# Patient Record
Sex: Male | Born: 1965
Health system: Southern US, Community
[De-identification: ages and names within clinical notes are randomized; demographics above are authoritative.]

## PROBLEM LIST (undated history)

## (undated) DIAGNOSIS — R51 Headache: Secondary | ICD-10-CM

## (undated) DIAGNOSIS — R519 Headache, unspecified: Secondary | ICD-10-CM

## (undated) DIAGNOSIS — M549 Dorsalgia, unspecified: Secondary | ICD-10-CM

## (undated) DIAGNOSIS — E119 Type 2 diabetes mellitus without complications: Secondary | ICD-10-CM

## (undated) DIAGNOSIS — I10 Essential (primary) hypertension: Principal | ICD-10-CM

## (undated) DIAGNOSIS — R413 Other amnesia: Secondary | ICD-10-CM

## (undated) DIAGNOSIS — M5030 Other cervical disc degeneration, unspecified cervical region: Secondary | ICD-10-CM

## (undated) DIAGNOSIS — E7800 Pure hypercholesterolemia, unspecified: Secondary | ICD-10-CM

## (undated) DIAGNOSIS — M5441 Lumbago with sciatica, right side: Secondary | ICD-10-CM

## (undated) DIAGNOSIS — Z8619 Personal history of other infectious and parasitic diseases: Secondary | ICD-10-CM

## (undated) DIAGNOSIS — B029 Zoster without complications: Secondary | ICD-10-CM

## (undated) DIAGNOSIS — G43909 Migraine, unspecified, not intractable, without status migrainosus: Secondary | ICD-10-CM

## (undated) DIAGNOSIS — G909 Disorder of the autonomic nervous system, unspecified: Secondary | ICD-10-CM

## (undated) DIAGNOSIS — M4800 Spinal stenosis, site unspecified: Secondary | ICD-10-CM

## (undated) HISTORY — DX: Headache, unspecified: R51.9

## (undated) HISTORY — PX: BACK SURGERY: SHX140

## (undated) HISTORY — DX: Pure hypercholesterolemia, unspecified: E78.00

## (undated) HISTORY — DX: Personal history of other infectious and parasitic diseases: Z86.19

## (undated) HISTORY — DX: Spinal stenosis, site unspecified: M48.00

## (undated) HISTORY — DX: Other cervical disc degeneration, unspecified cervical region: M50.30

## (undated) HISTORY — DX: Essential (primary) hypertension: I10

## (undated) HISTORY — DX: Migraine, unspecified, not intractable, without status migrainosus: G43.909

## (undated) HISTORY — DX: Lumbago with sciatica, right side: M54.41

## (undated) HISTORY — DX: Headache: R51

## (undated) HISTORY — DX: Other amnesia: R41.3

## (undated) HISTORY — DX: Zoster without complications: B02.9

## (undated) HISTORY — DX: Disorder of the autonomic nervous system, unspecified: G90.9

## (undated) HISTORY — DX: Type 2 diabetes mellitus without complications: E11.9

---

## 1998-10-24 ENCOUNTER — Encounter: Payer: Self-pay | Admitting: Emergency Medicine

## 1998-10-24 ENCOUNTER — Emergency Department (HOSPITAL_COMMUNITY): Admission: EM | Admit: 1998-10-24 | Discharge: 1998-10-24 | Payer: Self-pay | Admitting: Emergency Medicine

## 1999-08-30 ENCOUNTER — Ambulatory Visit (HOSPITAL_COMMUNITY): Admission: RE | Admit: 1999-08-30 | Discharge: 1999-08-30 | Payer: Self-pay | Admitting: Specialist

## 1999-08-30 ENCOUNTER — Encounter: Payer: Self-pay | Admitting: Specialist

## 1999-11-08 ENCOUNTER — Encounter (INDEPENDENT_AMBULATORY_CARE_PROVIDER_SITE_OTHER): Payer: Self-pay | Admitting: Specialist

## 1999-11-08 ENCOUNTER — Observation Stay (HOSPITAL_COMMUNITY): Admission: RE | Admit: 1999-11-08 | Discharge: 1999-11-09 | Payer: Self-pay | Admitting: Specialist

## 1999-11-08 ENCOUNTER — Encounter: Payer: Self-pay | Admitting: Specialist

## 2002-08-19 ENCOUNTER — Encounter: Admission: RE | Admit: 2002-08-19 | Discharge: 2002-08-19 | Payer: Self-pay | Admitting: Family Medicine

## 2002-08-19 ENCOUNTER — Encounter: Payer: Self-pay | Admitting: Family Medicine

## 2005-05-01 HISTORY — PX: WISDOM TOOTH EXTRACTION: SHX21

## 2006-02-03 ENCOUNTER — Encounter: Admission: RE | Admit: 2006-02-03 | Discharge: 2006-02-03 | Payer: Self-pay | Admitting: Rheumatology

## 2008-11-27 ENCOUNTER — Encounter: Admission: RE | Admit: 2008-11-27 | Discharge: 2008-11-27 | Payer: Self-pay | Admitting: Emergency Medicine

## 2008-11-29 ENCOUNTER — Encounter: Admission: RE | Admit: 2008-11-29 | Discharge: 2008-11-29 | Payer: Self-pay | Admitting: Emergency Medicine

## 2009-01-14 ENCOUNTER — Encounter: Admission: RE | Admit: 2009-01-14 | Discharge: 2009-01-14 | Payer: Self-pay | Admitting: Specialist

## 2009-02-04 ENCOUNTER — Ambulatory Visit (HOSPITAL_COMMUNITY): Admission: RE | Admit: 2009-02-04 | Discharge: 2009-02-05 | Payer: Self-pay | Admitting: Specialist

## 2009-05-20 ENCOUNTER — Encounter: Admission: RE | Admit: 2009-05-20 | Discharge: 2009-05-20 | Payer: Self-pay | Admitting: Specialist

## 2009-10-07 ENCOUNTER — Ambulatory Visit: Payer: Self-pay | Admitting: Vascular Surgery

## 2009-11-15 ENCOUNTER — Ambulatory Visit: Payer: Self-pay | Admitting: Vascular Surgery

## 2009-12-02 ENCOUNTER — Ambulatory Visit: Payer: Self-pay | Admitting: Vascular Surgery

## 2010-01-11 ENCOUNTER — Ambulatory Visit: Payer: Self-pay | Admitting: Vascular Surgery

## 2010-02-01 ENCOUNTER — Ambulatory Visit: Payer: Self-pay | Admitting: Vascular Surgery

## 2010-02-10 ENCOUNTER — Ambulatory Visit: Payer: Self-pay | Admitting: Vascular Surgery

## 2010-05-21 ENCOUNTER — Encounter: Payer: Self-pay | Admitting: Rheumatology

## 2010-08-04 LAB — COMPREHENSIVE METABOLIC PANEL
ALT: 40 U/L (ref 0–53)
AST: 23 U/L (ref 0–37)
Albumin: 4 g/dL (ref 3.5–5.2)
Alkaline Phosphatase: 77 U/L (ref 39–117)
BUN: 21 mg/dL (ref 6–23)
CO2: 30 mEq/L (ref 19–32)
Calcium: 9.6 mg/dL (ref 8.4–10.5)
Chloride: 102 mEq/L (ref 96–112)
Creatinine, Ser: 1.48 mg/dL (ref 0.4–1.5)
GFR calc Af Amer: >60 mL/min (ref >60)
GFR calc non Af Amer: 52 mL/min — ABNORMAL LOW (ref >60)
Glucose, Bld: 123 mg/dL — ABNORMAL HIGH (ref 70–99)
Potassium: 4.8 mEq/L (ref 3.5–5.1)
Sodium: 138 mEq/L (ref 135–145)
Total Bilirubin: 0.9 mg/dL (ref 0.3–1.2)
Total Protein: 7.8 g/dL (ref 6.0–8.3)

## 2010-08-04 LAB — CBC
HCT: 41.7 % (ref 39.0–52.0)
Hemoglobin: 14.3 g/dL (ref 13.0–17.0)
MCHC: 34.3 g/dL (ref 30.0–36.0)
MCV: 97.1 fL (ref 78.0–100.0)
Platelets: 197 10*3/uL (ref 150–400)
RBC: 4.29 MIL/uL (ref 4.22–5.81)
RDW: 12.9 % (ref 11.5–15.5)
WBC: 5.3 10*3/uL (ref 4.0–10.5)

## 2010-08-04 LAB — PROTIME-INR
INR: 0.98 (ref 0.00–1.49)
Prothrombin Time: 12.9 seconds (ref 11.6–15.2)

## 2010-08-04 LAB — URINALYSIS, ROUTINE W REFLEX MICROSCOPIC
Bilirubin Urine: NEGATIVE
Glucose, UA: NEGATIVE mg/dL
Hgb urine dipstick: NEGATIVE
Ketones, ur: NEGATIVE mg/dL
Nitrite: NEGATIVE
Protein, ur: NEGATIVE mg/dL
Specific Gravity, Urine: 1.022 (ref 1.005–1.030)
Urobilinogen, UA: 0.2 mg/dL (ref 0.0–1.0)
pH: 5.5 (ref 5.0–8.0)

## 2010-08-04 LAB — APTT: aPTT: 28 seconds (ref 24–37)

## 2010-09-13 NOTE — Consult Note (Signed)
VASCULAR SURGERY CONSULTATION   Jimmy [name], Jimmy [name]  DOB:  [birth year]                                       10/07/2009  [id number]#:[id number]   The patient in the office today in consultation for possible anterior  exposure for anterior lumbar antibody fusion by Dr. Shelle Iron.  This a  pleasant 45 year old gentleman who states that he has had a 10 to 60-  year history of low back pain.  His symptoms have gradually progressed  over the years and especially over the last year have been particularly  bothersome.  The pain in his back is aggravated by any activity such as  sitting and standing or walking.  Pain radiates down into his hips at  times.  There are no really alleviating factors.  He has had no  associated symptoms.  He has undergone an extensive workup by Dr. Shelle Iron,  and he has degenerative changes at L4-L5.  He is status post previous  posterior lumbar decompression but continues to have back pain with  possible central recurrent disk protrusion.  We were asked to consult  concerning his appropriateness for anterior exposure, the main concern  being his size.   PAST MEDICAL HISTORY:  Significant for obesity.  He denies any history  of diabetes, hypertension, hypercholesterolemia, history of previous  myocardial infarction, history of congestive heart failure or history of  COPD.   FAMILY HISTORY:  There is no history of premature cardiovascular  disease.   SOCIAL HISTORY:  He is married.  He has 4 children.  He quit tobacco in  2008.  He does not drink alcohol on a regular basis.   REVIEW OF SYSTEMS:  GENERAL:  He has gained some weight largely because  he has been unable to exercise because of his back problems.  He is 280  pounds, 5 feet 9 inches tall.  CARDIOVASCULAR:  He has had no chest pain, chest pressure, palpitations  or arrhythmias.  He has had no claudication, rest pain or nonhealing  ulcers.  He has had no history of stroke, TIAs or amaurosis fugax.   He  has had no history of DVT or phlebitis.  Pulmonary, GI, GU, neurologic, musculoskeletal, psychiatric, ENT,  hematologic review of systems is unremarkable and is documented on the  medical history form in his chart.   PHYSICAL EXAMINATION:  This is a pleasant 45 year old gentleman who  appears his stated age.  He has significant obesity.  Blood pressure is  117/77, heart rate is 88 respiratory rate 18.  HEENT:  Unremarkable.  Lungs:  Clear bilaterally to auscultation.  Cardiovascular:  I do not  detect any carotid bruits.  He has a regular rate and rhythm.  He has  palpable femoral and pedal pulses with mild peripheral edema  bilaterally.  Abdomen: Obese, somewhat difficult to assess, but I do not  appreciate any masses.  He has normal pitched bowel sounds.  Musculoskeletal:  No major deformities or cyanosis.  Neurologic:  He has  no focal weakness or paresthesias.  Skin:  There are no ulcers or  rashes.   I have reviewed his x-rays of his back which demonstrate degenerative  changes at L4-L5, as mentioned.   I had a long discussion with the patient and his wife today about the  risks of anterior exposure for anterior lumbar interbody fusion.  I have  explained that the main risk of the procedure is related to having to  mobilize and bluntly retract on the iliac veins and aorta to allow  adequate exposure of L4-L5.  We have discussed that 90% of the bleeding  injuries are related to exposures at the L4-L5 level and also that this  risk is significantly increased in patients with obesity.  I have  explained to him that at this point I have fairly limited experience  with the anterior exposures for ALIF and do not feel comfortable in  exposing the L4-L5 level in patients with significant obesity until I  have had more experience with this procedure.  Dr. Arbie Cookey does have more  experience with this, and I have spoken to him about this, and he would  be willing to consider exposure for  the patient if there were no other  options for addressing his back problems.  I have explained this to the  patient, and after discussion with Dr. Shelle Iron if he elects to proceed  with ALIF, he would like to meet with Dr. Arbie Cookey prior to scheduling  surgery.     Di Kindle. Edilia Bo, M.D.  Electronically Signed  CSD/MEDQ  D:  10/07/2009  T:  10/08/2009  Job:  [id number]   cc:   Jene Every, M.D.  Stan Head Cleta Alberts, M.D.

## 2010-09-13 NOTE — Assessment & Plan Note (Signed)
OFFICE VISIT   Jimmy [name], Jimmy [name]  DOB:  06/15/1965                                       02/01/2010  [id number]#:[id number]   Patient presents today with continued evaluation of potential anterior  exposure for ALIF.  He was seen in consultation with Dr. Cari Caraway  on 10/07/2009 with extensive evaluation at that time.  He does have  recurrent L4-5 disk disease and has been recommended by Dr. Paula Libra  to undergo anterior exposure for fusion.   I discussed this at length with patient and his wife present.  I again  explained our role in exposure.  He does have significant obesity.  I  explained that this does make the procedure somewhat more difficult but  certainly is very common in this disease population.  I explained that  since he has never had any intra-abdominal surgery that we would not  anticipate any adhesions or anything else that would complicate the  procedure.  I did discuss mobilization of the intraperitoneal contents,  ureter, and iliac arteries and veins, and potential injury of these.  I  also discussed the rare cases of erectile dysfunction and also a  possibility of retrograde ejaculation.  He understands.   He is changing insurance carriers and wishes to proceed with the  procedure as soon as he can assure insurance coverage, and I told him I  would be available to assist Dr. Shelle Iron as needed with the anterior  exposure once scheduled.     Larina Earthly, M.D.  Electronically Signed   TFE/MEDQ  D:  02/01/2010  T:  02/02/2010  Job:  [id number]   cc:   Jene Every, M.D.

## 2010-09-16 NOTE — Op Note (Signed)
[facility]  Patient:    Jimmy [name], Jimmy [name]                    MRN: [id number] Proc. Date: 11/08/99 Adm. Date:  [id number] Disc. Date: [id number] Attending:  Pierce Crane                           Operative Report  PREOPERATIVE DIAGNOSIS:  Herniated nucleus pulposus L4-5 central and bilateral lateral recessed stenosis.  POSTOPERATIVE DIAGNOSIS:  Herniated nucleus pulposus L4-5 central and bilateral lateral recessed stenosis.  OPERATION:  Bilateral hemilaminotomy with microdiskectomy, foraminotomies L4, L5.  ANESTHESIA:  General.  SURGEON:  Javier Docker, M.D.  ASSISTANT:  Della Goo, P.A.  BRIEF HISTORY AND INDICATIONS:  A 45 year old with bilateral L5 radiculopathy second to HNP centrally at L4-5, confirmed with MRI.  Positive neural tension signs bilaterally, right great than left.  Operative intervention is indicated for decompression due to failure to progress with conservative treatment. Risks and benefits were discussed, including bleeding infection, damage to vascular structures, CSF leak, ________  fibrosis, need for fusion in the future.  TECHNIQUE:  Patient in the supine position after the induction of adequate general anesthesia, 1 g Kefzol IV for antimicrobial prophylaxis.  Patient is placed prone in the [place] frame. All bony prominences are well-padded. Lumbar region is prepped and draped in the usual sterile fashion.  Two 18-gauge spinal needles utilized to localize the L4-5 interspace.  This is confirmed with x-ray.  Incision is made in the midline.  Marcaine 0.25% with epinephrine is infiltrated in the subcutaneous tissue.  Dorsolumbar fascia identified and divided on outline of the paraspinous ligament, preserving the interspinous ligament.  The paraspinous muscle is elevated from the lamina of L4 and L5 bilaterally.  Cole retractor is placed.  Operative microscope brought onto the surgical field.  High speed  burr was used to perform the hemilaminotomy at L4 and L5 bilaterally.  The significant lateral recessed stenosis noted bilaterally with hypertrophy of ligamenta flavum. This was removed with a 2 mm and a 3 mm Kerrison, protecting neural elements at all time.  Foraminotomy of L5 was performed on the left first, isolating the L5 nerve root.  Significant tension was note don the nerve root.  Nerve was gently immobilized medially.  Large disc herniation was noted more centrally.  Lateral recesses decompressed with a 2 mm Kerrison.  Foramina of L4 was stenotic as well and foraminotomy was performed here.  Nerve was protected at all times.  Annulotomy was performed with a 15 blade removing the disc from the disc space centrally with an upbiting and straight pituitary. Further disc material was mobilized with a nerve hook.  No residual disc material noted on the left.  _____-tip probe placed in the foramen of L5 and found to be widely patent.  Attention was turned to the right side in a similar fashion.  The intralaminar space was identified and lateral recesses decompressed and foraminotomies performed at L4 and L5 with serious stenosis here and ligamenta flavum hypertrophy, especially in the L5 root.  The flavum was identified, after the lateral recesses decompressed and the foraminotomies, was gently mobilized medially.  Disc herniation was noted here more central.  Annulotomy was performed.  Copious portion of disc material was removed from the interspace, further immobilized with a nerve hook and the disc herniation retrieved.  Some residual disc herniation noted.  The axilla was examined as it  was bilaterally, as well as the foramen of L4 and L5 and found to be widely patent without evidence disc herniation.  The thecal sac was examined caudad and cephalad without evidence of residual disc herniation. The disc was irrigated bilaterally.  The left disc space was reinspected, a small disc  fragment was noted and retrieved.  Disc was copiously irrigated. Bipolar electrocautery utilized to achieve hemostasis.  Inspection revealed no CSF leakage or active bleeding.  5% Gelfoam was placed in the laminotomy defects.  Richardson Dopp retrator was removed.  Spinous mucles inspected and no evidenc eof active bleeding. Dorsolumbar fascia reapproximated with #1 Vicryl figure of eight sutures.  Subcutaneous tissues reapproximated with 2-0 Vicryl simple sutures and skin reapproximated with 3-0 subcuticular PDS.  Wounds were reinforced with Steri-Strips.  Sterile dressing applied.  Patient placed supine on a hospital bed and extubated without difficulty and transported to the recovery room.  Patient tolerated the procedure well. DD:  11/08/99 TD:  11/08/99 Job: [id number]

## 2010-09-16 NOTE — H&P (Signed)
[facility]  Patient:    LYRIK, Jimmy [name]                      MRN: [id number] Adm. Date:  11/08/99 Attending:  Javier Docker, M.D. Dictator:   Grayland Jack, P.A.                         History and Physical  DATE OF BIRTH:  30-May-1965  CHIEF COMPLAINT:  Low back pain with bilateral lower extremity pain.  HISTORY OF PRESENT ILLNESS:  This 45 year old male has approximately a four-month history of low back and bilateral lower extremity radicular pain.  He has undergone conservative treatment, including epidural steroid injections and oral anti-inflammatories.  An MRI revealed an essential disk herniation at L4-L5 with associated bilateral lateral recess stenosis and neural foraminal compromise. ue to this patients failure to conservative treatment, it was felt that he would best benefit from surgical intervention at this time.  The risks, benefits, and complications of surgery were discussed with the patient in detail, and he agreed to proceed.  ALLERGIES:  No known drug allergies.  CURRENT MEDICATIONS:  Mobic 7.5 mg q.d.  PAST MEDICAL HISTORY:  Negative.  PAST SURGICAL HISTORY:  Negative.  SOCIAL HISTORY:  This patient lives at home with his grandmother and his son. There are approximately three steps into the usual entrance of his home.  He resides in a single-story house.  He smokes 1/2 to 1 pack of cigarettes per day. He denies alcohol use.  FAMILY HISTORY:  Significant for hypertension and diabetes mellitus.  REVIEW OF SYSTEMS:  GENERAL:  The patient denies fever, chills, weight changes, or malaise.  HEENT:  The patient denies headaches, visual changes, ear pain or pressure, nasal congestion, discharge, sore throats, or dysphagia. RESPIRATORY: The patient denies a productive cough, hemoptysis, or shortness of breath. CARDIAC:  The patient denies chest pain, palpitations, angina, or dyspnea on exertion.  GI:  The patient  denies abdominal pain, nausea, vomiting, constipation, diarrhea, or bloody stools.  GENITOURINARY:  The patient denies dysuria, hematuria, increasing frequency or hesitancy.  MUSCULOSKELETAL:  See the HPI. HEMATOLOGICAL: The patient denies a history of bleeding tendencies or anemia.  PHYSICAL EXAMINATION:  VITAL SIGNS:  Temperature afebrile, pulse 70, respirations 16, blood pressure 125/70.  GENERAL:  This is a well-developed, well-nourished 45 year old male, in no acute distress.  HEENT:  Normocephalic, atraumatic.  Pupils equal, round, reactive to light. Extraocular muscles intact.  NECK:  Supple, no jugular venous distention.  No lymphadenopathy and no carotid  bruits appreciated.  CHEST:  Clear to auscultation.  No rales, rhonchi, or wheezing bilaterally.  HEART:  A regular rate and rhythm.  No rubs, no gallops, no murmurs.  ABDOMEN:  Positive bowel sounds, soft, nontender.  No organomegaly appreciated.  BREASTS/RECTAL/GU:  Not done, not pertinent to the present illness.  EXTREMITIES:  Sensation intact to bilateral lower extremities.  He has mild right-sided lower extremity weakness.  Reflexes intact.  IMPRESSION:  Herniated nucleus pulposus at L4-L5.  PLAN:  This patient will be admitted to [facility] on July 0, 2001, to undergo bilateral hemilaminectomies and microdiskectomies at L4-L5. The postoperative course has been discussed with the patient, and all questions have been encouraged and answered. DD:  11/03/99 TD:  11/03/99 Job: [id number]

## 2014-10-21 ENCOUNTER — Encounter (HOSPITAL_COMMUNITY): Payer: Self-pay | Admitting: *Deleted

## 2014-10-21 ENCOUNTER — Emergency Department (HOSPITAL_COMMUNITY)
Admission: EM | Admit: 2014-10-21 | Discharge: 2014-10-21 | Disposition: A | Discharge status: Home / Self Care | Payer: Self-pay | Attending: Emergency Medicine | Admitting: Emergency Medicine

## 2014-10-21 DIAGNOSIS — Z720 Tobacco use: Secondary | ICD-10-CM | POA: Insufficient documentation

## 2014-10-21 DIAGNOSIS — H539 Unspecified visual disturbance: Secondary | ICD-10-CM | POA: Insufficient documentation

## 2014-10-21 DIAGNOSIS — R1032 Left lower quadrant pain: Secondary | ICD-10-CM | POA: Insufficient documentation

## 2014-10-21 DIAGNOSIS — H5712 Ocular pain, left eye: Secondary | ICD-10-CM | POA: Insufficient documentation

## 2014-10-21 DIAGNOSIS — J3489 Other specified disorders of nose and nasal sinuses: Secondary | ICD-10-CM | POA: Insufficient documentation

## 2014-10-21 DIAGNOSIS — Z8739 Personal history of other diseases of the musculoskeletal system and connective tissue: Secondary | ICD-10-CM | POA: Insufficient documentation

## 2014-10-21 DIAGNOSIS — R0981 Nasal congestion: Secondary | ICD-10-CM | POA: Insufficient documentation

## 2014-10-21 DIAGNOSIS — B029 Zoster without complications: Secondary | ICD-10-CM | POA: Insufficient documentation

## 2014-10-21 DIAGNOSIS — R51 Headache: Secondary | ICD-10-CM | POA: Insufficient documentation

## 2014-10-21 DIAGNOSIS — G8929 Other chronic pain: Secondary | ICD-10-CM | POA: Insufficient documentation

## 2014-10-21 DIAGNOSIS — R112 Nausea with vomiting, unspecified: Secondary | ICD-10-CM | POA: Insufficient documentation

## 2014-10-21 DIAGNOSIS — R42 Dizziness and giddiness: Secondary | ICD-10-CM | POA: Insufficient documentation

## 2014-10-21 DIAGNOSIS — R197 Diarrhea, unspecified: Secondary | ICD-10-CM | POA: Insufficient documentation

## 2014-10-21 HISTORY — DX: Dorsalgia, unspecified: M54.9

## 2014-10-21 LAB — CBC WITH DIFFERENTIAL/PLATELET
Basophils Absolute: 0 10*3/uL (ref 0.0–0.1)
Basophils Relative: 1 % (ref 0–1)
Eosinophils Absolute: 0.1 10*3/uL (ref 0.0–0.7)
Eosinophils Relative: 2 % (ref 0–5)
HCT: 40.9 % (ref 39.0–52.0)
Hemoglobin: 14.8 g/dL (ref 13.0–17.0)
Lymphocytes Relative: 25 % (ref 12–46)
Lymphs Abs: 1.1 10*3/uL (ref 0.7–4.0)
MCH: 32.7 pg (ref 26.0–34.0)
MCHC: 36.2 g/dL — ABNORMAL HIGH (ref 30.0–36.0)
MCV: 90.5 fL (ref 78.0–100.0)
Monocytes Absolute: 0.4 10*3/uL (ref 0.1–1.0)
Monocytes Relative: 10 % (ref 3–12)
Neutro Abs: 2.7 10*3/uL (ref 1.7–7.7)
Neutrophils Relative %: 62 % (ref 43–77)
Platelets: 193 10*3/uL (ref 150–400)
RBC: 4.52 MIL/uL (ref 4.22–5.81)
RDW: 12 % (ref 11.5–15.5)
WBC: 4.3 10*3/uL (ref 4.0–10.5)

## 2014-10-21 LAB — LIPASE, BLOOD: Lipase: 22 U/L (ref 22–51)

## 2014-10-21 LAB — COMPREHENSIVE METABOLIC PANEL
ALT: 21 U/L (ref 17–63)
AST: 19 U/L (ref 15–41)
Albumin: 3.6 g/dL (ref 3.5–5.0)
Alkaline Phosphatase: 77 U/L (ref 38–126)
Anion gap: 10 (ref 5–15)
BUN: 9 mg/dL (ref 6–20)
CO2: 23 mmol/L (ref 22–32)
Calcium: 9.3 mg/dL (ref 8.9–10.3)
Chloride: 101 mmol/L (ref 101–111)
Creatinine, Ser: 1.01 mg/dL (ref 0.61–1.24)
GFR calc Af Amer: >60 mL/min (ref >60)
GFR calc non Af Amer: >60 mL/min (ref >60)
Glucose, Bld: 235 mg/dL — ABNORMAL HIGH (ref 65–99)
Potassium: 3.3 mmol/L — ABNORMAL LOW (ref 3.5–5.1)
Sodium: 134 mmol/L — ABNORMAL LOW (ref 135–145)
Total Bilirubin: 0.7 mg/dL (ref 0.3–1.2)
Total Protein: 6.8 g/dL (ref 6.5–8.1)

## 2014-10-21 MED ORDER — HYDROCODONE-ACETAMINOPHEN 5-325 MG PO TABS: 1.0000 | ORAL_TABLET | ORAL | Status: DC | PRN

## 2014-10-21 MED ORDER — ACETAMINOPHEN 325 MG PO TABS
650.0000 mg | ORAL_TABLET | Freq: Once | ORAL | Status: AC
Start: 2014-10-21 — End: 2014-10-21
  Administered 2014-10-21: 650 mg via ORAL
  Filled 2014-10-21: qty 2

## 2014-10-21 MED ORDER — ACYCLOVIR 400 MG PO TABS: 800.0000 mg | ORAL_TABLET | Freq: Every day | ORAL | Status: DC

## 2014-10-21 MED ORDER — ONDANSETRON HCL 4 MG/2ML IJ SOLN
4.0000 mg | Freq: Once | INTRAMUSCULAR | Status: AC
  Administered 2014-10-21: 4 mg via INTRAVENOUS
  Filled 2014-10-21: qty 2

## 2014-10-21 MED ORDER — ACYCLOVIR 200 MG PO CAPS
800.0000 mg | ORAL_CAPSULE | Freq: Once | ORAL | Status: AC
  Administered 2014-10-21: 800 mg via ORAL
  Filled 2014-10-21: qty 4

## 2014-10-21 MED ORDER — FLUORESCEIN SODIUM 1 MG OP STRP
1.0000 | ORAL_STRIP | Freq: Once | OPHTHALMIC | Status: AC
  Administered 2014-10-21: 1 via OPHTHALMIC
  Filled 2014-10-21: qty 1

## 2014-10-21 MED ORDER — TETRACAINE HCL 0.5 % OP SOLN
1.0000 [drp] | Freq: Once | OPHTHALMIC | Status: AC
  Administered 2014-10-21: 1 [drp] via OPHTHALMIC
  Filled 2014-10-21: qty 2

## 2014-10-21 MED ORDER — SODIUM CHLORIDE 0.9 % IV BOLUS (SEPSIS)
1000.0000 mL | Freq: Once | INTRAVENOUS | Status: AC
  Administered 2014-10-21: 1000 mL via INTRAVENOUS

## 2014-10-21 MED ORDER — ONDANSETRON 4 MG PO TBDP
8.0000 mg | ORAL_TABLET | Freq: Once | ORAL | Status: AC
  Administered 2014-10-21: 8 mg via ORAL

## 2014-10-21 MED ORDER — ONDANSETRON 4 MG PO TBDP
ORAL_TABLET | ORAL | Status: AC
  Filled 2014-10-21: qty 2

## 2014-10-21 MED ORDER — MORPHINE SULFATE 4 MG/ML IJ SOLN
4.0000 mg | Freq: Once | INTRAMUSCULAR | Status: AC
  Administered 2014-10-21: 4 mg via INTRAVENOUS
  Filled 2014-10-21: qty 1

## 2014-10-21 MED ORDER — NAPROXEN 250 MG PO TABS: 250.0000 mg | ORAL_TABLET | Freq: Two times a day (BID) | ORAL | Status: DC

## 2014-10-21 MED ORDER — ONDANSETRON 4 MG PO TBDP: 4.0000 mg | ORAL_TABLET | Freq: Three times a day (TID) | ORAL | Status: DC | PRN

## 2014-10-21 NOTE — ED Provider Notes (Signed)
CSN: [id number]     Arrival date & time 10/21/14  1230 History   First MD Initiated Contact with Patient 10/21/14 1330     Chief Complaint  Patient presents with  . Emesis  . Eye Pain   Jimmy [name] is a 49 y.o. male with a history of degenerative disc disease and chronic low back pain who presents to the emergency department complaining of fatigue, nausea, vomiting, diarrhea, left eye pain and headache. Patient reports that approximately 1 week ago he began having nausea and vomiting. He reports his vomiting resolved approximately 5 days ago he continued to feel fatigued. She reports that 3 days ago he noticed a rash over his left eye that is very painful. He reports morning he woke up with an 8 out of 10 headache and 6 out of 10 on a pain. He reports his vision is blurry and has matting to his eye. He denies any double vision. He also reports associated nasal congestion. He also reports some room spinning dizziness today. He denies any vomiting or diarrhea today. He denies any neck pain or neck stiffness. He reports subjective fever. The patient is not on any immunosuppressants. The patient denies abdominal pain, urinary symptoms, hematemesis, hematochezia, neck pain, neck stiffness, double vision, or abdominal surgeries.   (Consider location/radiation/quality/duration/timing/severity/associated sxs/prior Treatment) HPI  Past Medical History  Diagnosis Date  . Back pain    History reviewed. No pertinent past surgical history. History reviewed. No pertinent family history. History  Substance Use Topics  . Smoking status: Current Every Day Smoker  . Smokeless tobacco: Not on file  . Alcohol Use: Not on file    Review of Systems  Constitutional: Negative for fever and chills.  HENT: Positive for rhinorrhea. Negative for congestion, hearing loss, mouth sores, postnasal drip, sore throat and trouble swallowing.   Eyes: Positive for pain, discharge and visual disturbance.  Respiratory:  Negative for cough, shortness of breath and wheezing.   Cardiovascular: Negative for chest pain and palpitations.  Gastrointestinal: Positive for nausea, vomiting and diarrhea. Negative for abdominal pain and blood in stool.  Genitourinary: Negative for dysuria, urgency, frequency, hematuria and difficulty urinating.  Musculoskeletal: Positive for back pain (chronic ). Negative for neck pain.  Skin: Positive for rash.  Neurological: Positive for light-headedness and headaches. Negative for syncope, weakness and numbness.      Allergies  Review of patient's allergies indicates no known allergies.  Home Medications   Prior to Admission medications   Medication Sig Start Date End Date Taking? Authorizing Provider  acyclovir (ZOVIRAX) 400 MG tablet Take 2 tablets (800 mg total) by mouth 5 (five) times daily. 10/21/14   Everlene Farrier, PA-C  HYDROcodone-acetaminophen (NORCO/VICODIN) 5-325 MG per tablet Take 1-2 tablets by mouth every 4 (four) hours as needed. 10/21/14   Everlene Farrier, PA-C  naproxen (NAPROSYN) 250 MG tablet Take 1 tablet (250 mg total) by mouth 2 (two) times daily with a meal. 10/21/14   Everlene Farrier, PA-C  ondansetron (ZOFRAN ODT) 4 MG disintegrating tablet Take 1 tablet (4 mg total) by mouth every 8 (eight) hours as needed for nausea or vomiting. 10/21/14   Everlene Farrier, PA-C   BP 132/73 mmHg  Pulse 80  Temp(Src) 98.2 F (36.8 C) (Oral)  Resp 14  Ht  (1.803 m)  Wt 250 lb (113.399 kg)  BMI 34.88 kg/m2  SpO2 99% Physical Exam  Constitutional: He is oriented to person, place, and time. He appears well-developed and well-nourished. No distress.  Nontoxic  appearing.  HENT:  Head: Normocephalic and atraumatic.  Right Ear: External ear normal.  Left Ear: External ear normal.  Mouth/Throat: Oropharynx is clear and moist. No oropharyngeal exudate.  Painful vesicular papular rash to the patient's left eye extending up into his left forehead and does not cross the  midline. They are beginning to crust and are tender to palpation.  Bilateral tympanic membranes are pearly-gray without erythema or loss of landmarks.   Eyes: EOM are normal. Pupils are equal, round, and reactive to light. Right eye exhibits no discharge. Left eye exhibits discharge.  Matting discharge from left eye with left upper eyelid edema. EOMs are intact. Patient's left eye is anesthetized with tetracaine and stained with fluorescein. No evidence of dendritic lesions on woods  Lamp exam. No corneal abrasions.   Neck: Normal range of motion. Neck supple. No JVD present. No tracheal deviation present.  Cardiovascular: Normal rate, regular rhythm, normal heart sounds and intact distal pulses.  Exam reveals no gallop and no friction rub.   No murmur heard. Pulmonary/Chest: Effort normal and breath sounds normal. No respiratory distress. He has no wheezes. He has no rales.  Abdominal: Soft. Bowel sounds are normal. He exhibits no distension. There is tenderness. There is no guarding.  Abdomen is soft. Bowel sounds are present. Patient has mild left lower abdominal tenderness to palpation. Negative psoas and obturator sign. No peritoneal signs.  Musculoskeletal: He exhibits no edema.  Lymphadenopathy:    He has no cervical adenopathy.  Neurological: He is alert and oriented to person, place, and time. No cranial nerve deficit. Coordination normal.  Cranial nerves are intact.   Skin: Skin is warm and dry. No rash noted. He is not diaphoretic. No erythema. No pallor.  Psychiatric: He has a normal mood and affect. His behavior is normal.  Nursing note and vitals reviewed.   ED Course  Procedures (including critical care time) Labs Review Labs Reviewed  CBC WITH DIFFERENTIAL/PLATELET - Abnormal; Notable for the following:    MCHC 36.2 (*)    All other components within normal limits  COMPREHENSIVE METABOLIC PANEL - Abnormal; Notable for the following:    Sodium 134 (*)    Potassium 3.3 (*)     Glucose, Bld 235 (*)    All other components within normal limits  LIPASE, BLOOD    Imaging Review No results found.   EKG Interpretation None      Filed Vitals:   10/21/14 1445 10/21/14 1500 10/21/14 1515 10/21/14 1621  BP: 146/89 146/79 141/78 132/73  Pulse: 80 82 81 80  Temp:      TempSrc:      Resp: Height:      Weight:      SpO2: 99% 100% 96% 99%     MDM   Meds given in ED:  Medications  ondansetron (ZOFRAN-ODT) 4 MG disintegrating tablet (not administered)  ondansetron (ZOFRAN-ODT) disintegrating tablet 8 mg (8 mg Oral Given 10/21/14 1308)  sodium chloride 0.9 % bolus 1,000 mL (0 mLs Intravenous Stopped 10/21/14 1516)  ondansetron (ZOFRAN) injection 4 mg (4 mg Intravenous Given 10/21/14 1420)  morphine 4 MG/ML injection 4 mg (4 mg Intravenous Given 10/21/14 1420)  tetracaine (PONTOCAINE) 0.5 % ophthalmic solution 1 drop (1 drop Both Eyes Given 10/21/14 1459)  fluorescein ophthalmic strip 1 strip (1 strip Both Eyes Given 10/21/14 1459)  acyclovir (ZOVIRAX) 200 MG capsule 800 mg (800 mg Oral Given 10/21/14 1515)  acetaminophen (TYLENOL) tablet 650 mg (650  mg Oral Given 10/21/14 1603)    Discharge Medication List as of 10/21/2014  4:11 PM    START taking these medications   Details  acyclovir (ZOVIRAX) 400 MG tablet Take 2 tablets (800 mg total) by mouth 5 (five) times daily., Starting 10/21/2014, Until Discontinued, Print    HYDROcodone-acetaminophen (NORCO/VICODIN) 5-325 MG per tablet Take 1-2 tablets by mouth every 4 (four) hours as needed., Starting 10/21/2014, Until Discontinued, Print    naproxen (NAPROSYN) 250 MG tablet Take 1 tablet (250 mg total) by mouth 2 (two) times daily with a meal., Starting 10/21/2014, Until Discontinued, Print    ondansetron (ZOFRAN ODT) 4 MG disintegrating tablet Take 1 tablet (4 mg total) by mouth every 8 (eight) hours as needed for nausea or vomiting., Starting 10/21/2014, Until Discontinued, Print        Final  diagnoses:  Shingles  Non-intractable vomiting with nausea, vomiting of unspecified type   This is a 49 y.o. male with a history of degenerative disc disease and chronic low back pain who presents to the emergency department complaining of fatigue, nausea, vomiting, diarrhea, left eye pain and headache. Patient reports that approximately 1 week ago he began having nausea and vomiting. He reports his vomiting resolved approximately 5 days ago he continued to feel fatigued. She reports that 3 days ago he noticed a rash over his left eye that is very painful. He reports morning he woke up with an 8 out of 10 headache and 6 out of 10 on a pain. He reports his vision is blurry and has matting to his eye. He denies any double vision. He reports he last vomited 5 days ago. He denies any diarrhea today. On exam the patient is afebrile nontoxic appearing. The patient's abdomen is soft and has mild left-sided abdominal tenderness. No peritoneal signs. The patient has a vesicular papular rash to the patient's left eyelid that extends up into his left forehead and does not cross the midline. These lesions are beginning to crust and are tender to palpation. Consistent with shingles. On fluorescein stain the patient has no herpetic lesions. Patient provided with flu bolus, Tylenol, morphine, and Zofran in the ED. He reports his nausea resolved. He has not vomited while in the ED. CBC is unremarkable. CMP indicates a sodium of 134 and potassium of 3.3 and is otherwise unremarkable. I consulted with ophthalmologist Dr. Vonna Kotyk who would like the patient to come to his office now for an exam since it extends over his eyelid. Patient discharged with prescriptions for acyclovir, Norco, naproxen and Zofran. Advised to proceed to the ophthalmologist office immediately for examination. Strict return precautions provided. I advised the patient to follow-up with their primary care provider this week. I advised the patient to return to  the emergency department with new or worsening symptoms or new concerns. The patient verbalized understanding and agreement with plan.    This patient was discussed with and evaluated by Dr. Hyacinth Meeker who agrees with assessment and plan.    Everlene Farrier, PA-C 10/21/14 1647  Eber Hong, MD 10/21/14 [phone]

## 2014-10-21 NOTE — ED Notes (Signed)
Pt in c/o n/v for the last week, two days ago developed left eye pain and swelling, also c/o generalized fatigue, no distress noted

## 2014-10-21 NOTE — Discharge Instructions (Signed)
Shingles Shingles (herpes zoster) is an infection that is caused by the same virus that causes chickenpox (varicella). The infection causes a painful skin rash and fluid-filled blisters, which eventually break open, crust over, and heal. It may occur in any area of the body, but it usually affects only one side of the body or face. The pain of shingles usually lasts about 1 month. However, some people with shingles may develop long-term (chronic) pain in the affected area of the body. Shingles often occurs many years after the person had chickenpox. It is more common:  In people older than 50 years.  In people with weakened immune systems, such as those with HIV, AIDS, or cancer.  In people taking medicines that weaken the immune system, such as transplant medicines.  In people under great stress. CAUSES  Shingles is caused by the varicella zoster virus (VZV), which also causes chickenpox. After a person is infected with the virus, it can remain in the person's body for years in an inactive state (dormant). To cause shingles, the virus reactivates and breaks out as an infection in a nerve root. The virus can be spread from person to person (contagious) through contact with open blisters of the shingles rash. It will only spread to people who have not had chickenpox. When these people are exposed to the virus, they may develop chickenpox. They will not develop shingles. Once the blisters scab over, the person is no longer contagious and cannot spread the virus to others. SIGNS AND SYMPTOMS  Shingles shows up in stages. The initial symptoms may be pain, itching, and tingling in an area of the skin. This pain is usually described as burning, stabbing, or throbbing.In a few days or weeks, a painful red rash will appear in the area where the pain, itching, and tingling were felt. The rash is usually on one side of the body in a band or belt-like pattern. Then, the rash usually turns into fluid-filled  blisters. They will scab over and dry up in approximately 2-3 weeks. Flu-like symptoms may also occur with the initial symptoms, the rash, or the blisters. These may include:  Fever.  Chills.  Headache.  Upset stomach. DIAGNOSIS  Your health care provider will perform a skin exam to diagnose shingles. Skin scrapings or fluid samples may also be taken from the blisters. This sample will be examined under a microscope or sent to a lab for further testing. TREATMENT  There is no specific cure for shingles. Your health care provider will likely prescribe medicines to help you manage the pain, recover faster, and avoid long-term problems. This may include antiviral drugs, anti-inflammatory drugs, and pain medicines. HOME CARE INSTRUCTIONS   Take a cool bath or apply cool compresses to the area of the rash or blisters as directed. This may help with the pain and itching.   Take medicines only as directed by your health care provider.   Rest as directed by your health care provider.  Keep your rash and blisters clean with mild soap and cool water or as directed by your health care provider.  Do not pick your blisters or scratch your rash. Apply an anti-itch cream or numbing creams to the affected area as directed by your health care provider.  Keep your shingles rash covered with a loose bandage (dressing).  Avoid skin contact with:  Babies.   Pregnant women.   Children with eczema.   Elderly people with transplants.   People with chronic illnesses, such as leukemia  or AIDS.   Wear loose-fitting clothing to help ease the pain of material rubbing against the rash.  Keep all follow-up visits as directed by your health care provider.If the area involved is on your face, you may receive a referral for a specialist, such as an eye doctor (ophthalmologist) or an ear, nose, and throat (ENT) doctor. Keeping all follow-up visits will help you avoid eye problems, chronic pain, or  disability.  SEEK IMMEDIATE MEDICAL CARE IF:   You have facial pain, pain around the eye area, or loss of feeling on one side of your face.  You have ear pain or ringing in your ear.  You have loss of taste.  Your pain is not relieved with prescribed medicines.   Your redness or swelling spreads.   You have more pain and swelling.  Your condition is worsening or has changed.   You have a fever. MAKE SURE YOU:  Understand these instructions.  Will watch your condition.  Will get help right away if you are not doing well or get worse. Document Released: 04/17/2005 Document Revised: 09/01/2013 Document Reviewed: 11/30/2011 [facility] Patient Information 2015 [place], [place]. This information is not intended to replace advice given to you by your health care provider. Make sure you discuss any questions you have with your health care provider. Nausea and Vomiting Nausea is a sick feeling that often comes before throwing up (vomiting). Vomiting is a reflex where stomach contents come out of your mouth. Vomiting can cause severe loss of body fluids (dehydration). Children and elderly adults can become dehydrated quickly, especially if they also have diarrhea. Nausea and vomiting are symptoms of a condition or disease. It is important to find the cause of your symptoms. CAUSES   Direct irritation of the stomach lining. This irritation can result from increased acid production (gastroesophageal reflux disease), infection, food poisoning, taking certain medicines (such as nonsteroidal anti-inflammatory drugs), alcohol use, or tobacco use.  Signals from the brain.These signals could be caused by a headache, heat exposure, an inner ear disturbance, increased pressure in the brain from injury, infection, a tumor, or a concussion, pain, emotional stimulus, or metabolic problems.  An obstruction in the gastrointestinal tract (bowel obstruction).  Illnesses such as diabetes, hepatitis,  gallbladder problems, appendicitis, kidney problems, cancer, sepsis, atypical symptoms of a heart attack, or eating disorders.  Medical treatments such as chemotherapy and radiation.  Receiving medicine that makes you sleep (general anesthetic) during surgery. DIAGNOSIS Your caregiver may ask for tests to be done if the problems do not improve after a few days. Tests may also be done if symptoms are severe or if the reason for the nausea and vomiting is not clear. Tests may include:  Urine tests.  Blood tests.  Stool tests.  Cultures (to look for evidence of infection).  X-rays or other imaging studies. Test results can help your caregiver make decisions about treatment or the need for additional tests. TREATMENT You need to stay well hydrated. Drink frequently but in small amounts.You may wish to drink water, sports drinks, clear broth, or eat frozen ice pops or gelatin dessert to help stay hydrated.When you eat, eating slowly may help prevent nausea.There are also some antinausea medicines that may help prevent nausea. HOME CARE INSTRUCTIONS   Take all medicine as directed by your caregiver.  If you do not have an appetite, do not force yourself to eat. However, you must continue to drink fluids.  If you have an appetite, eat a normal diet unless  your caregiver tells you differently.  Eat a variety of complex carbohydrates (rice, wheat, potatoes, bread), lean meats, yogurt, fruits, and vegetables.  Avoid high-fat foods because they are more difficult to digest.  Drink enough water and fluids to keep your urine clear or pale yellow.  If you are dehydrated, ask your caregiver for specific rehydration instructions. Signs of dehydration may include:  Severe thirst.  Dry lips and mouth.  Dizziness.  Dark urine.  Decreasing urine frequency and amount.  Confusion.  Rapid breathing or pulse. SEEK IMMEDIATE MEDICAL CARE IF:   You have blood or brown flecks (like coffee  grounds) in your vomit.  You have black or bloody stools.  You have a severe headache or stiff neck.  You are confused.  You have severe abdominal pain.  You have chest pain or trouble breathing.  You do not urinate at least once every 8 hours.  You develop cold or clammy skin.  You continue to vomit for longer than 24 to 48 hours.  You have a fever. MAKE SURE YOU:   Understand these instructions.  Will watch your condition.  Will get help right away if you are not doing well or get worse. Document Released: 04/17/2005 Document Revised: 07/10/2011 Document Reviewed: 09/14/2010 [facility] Patient Information 2015 [place], [place]. This information is not intended to replace advice given to you by your health care provider. Make sure you discuss any questions you have with your health care provider.

## 2014-10-21 NOTE — ED Provider Notes (Signed)
The patient is a 49 year old male, he presents with 2 days of eye pain, this also involves his forehead, he doesn't fact have a vesicular papular rash across the scalp on the left side going down onto his forehead and his eyelid, this is starting to crust, his conjunctiva is injected on the left. He will need a fluorescein tetracaine exam to look for herpetic lesions, anticipate ophthalmology follow-up.  Medical screening examination/treatment/procedure(s) were conducted as a shared visit with non-physician practitioner(s) and myself.  I personally evaluated the patient during the encounter.  Clinical Impression:   Final diagnoses:  Shingles  Non-intractable vomiting with nausea, vomiting of unspecified type         Eber Hong, MD 10/21/14 2725

## 2014-10-21 NOTE — ED Notes (Signed)
Woods lamp to bedside 

## 2014-11-29 ENCOUNTER — Encounter (HOSPITAL_COMMUNITY): Payer: Self-pay | Admitting: *Deleted

## 2014-11-29 ENCOUNTER — Emergency Department (HOSPITAL_COMMUNITY)
Admission: EM | Admit: 2014-11-29 | Discharge: 2014-11-30 | Disposition: A | Discharge status: Home / Self Care | Payer: Medicare Other | Attending: Emergency Medicine | Admitting: Emergency Medicine

## 2014-11-29 DIAGNOSIS — R339 Retention of urine, unspecified: Secondary | ICD-10-CM | POA: Diagnosis not present

## 2014-11-29 DIAGNOSIS — M545 Low back pain: Secondary | ICD-10-CM | POA: Diagnosis not present

## 2014-11-29 DIAGNOSIS — R404 Transient alteration of awareness: Secondary | ICD-10-CM | POA: Diagnosis not present

## 2014-11-29 DIAGNOSIS — R109 Unspecified abdominal pain: Secondary | ICD-10-CM | POA: Insufficient documentation

## 2014-11-29 DIAGNOSIS — R140 Abdominal distension (gaseous): Secondary | ICD-10-CM | POA: Insufficient documentation

## 2014-11-29 DIAGNOSIS — R112 Nausea with vomiting, unspecified: Secondary | ICD-10-CM | POA: Insufficient documentation

## 2014-11-29 DIAGNOSIS — R5383 Other fatigue: Secondary | ICD-10-CM | POA: Insufficient documentation

## 2014-11-29 DIAGNOSIS — R531 Weakness: Secondary | ICD-10-CM | POA: Diagnosis not present

## 2014-11-29 DIAGNOSIS — M6281 Muscle weakness (generalized): Secondary | ICD-10-CM | POA: Insufficient documentation

## 2014-11-29 DIAGNOSIS — R1084 Generalized abdominal pain: Secondary | ICD-10-CM | POA: Diagnosis not present

## 2014-11-29 DIAGNOSIS — M549 Dorsalgia, unspecified: Secondary | ICD-10-CM

## 2014-11-29 LAB — BASIC METABOLIC PANEL
Anion gap: 9 (ref 5–15)
BUN: 7 mg/dL (ref 6–20)
CO2: 19 mmol/L — ABNORMAL LOW (ref 22–32)
Calcium: 8.6 mg/dL — ABNORMAL LOW (ref 8.9–10.3)
Chloride: 96 mmol/L — ABNORMAL LOW (ref 101–111)
Creatinine, Ser: 0.86 mg/dL (ref 0.61–1.24)
GFR calc Af Amer: >60 mL/min (ref >60)
GFR calc non Af Amer: >60 mL/min (ref >60)
Glucose, Bld: 114 mg/dL — ABNORMAL HIGH (ref 65–99)
Potassium: 3.4 mmol/L — ABNORMAL LOW (ref 3.5–5.1)
Sodium: 124 mmol/L — ABNORMAL LOW (ref 135–145)

## 2014-11-29 LAB — CBC
HCT: 35.5 % — ABNORMAL LOW (ref 39.0–52.0)
Hemoglobin: 12.9 g/dL — ABNORMAL LOW (ref 13.0–17.0)
MCH: 33.5 pg (ref 26.0–34.0)
MCHC: 36.3 g/dL — ABNORMAL HIGH (ref 30.0–36.0)
MCV: 92.2 fL (ref 78.0–100.0)
Platelets: 218 10*3/uL (ref 150–400)
RBC: 3.85 MIL/uL — ABNORMAL LOW (ref 4.22–5.81)
RDW: 12.5 % (ref 11.5–15.5)
WBC: 4.9 10*3/uL (ref 4.0–10.5)

## 2014-11-29 MED ORDER — SODIUM CHLORIDE 0.9 % IV SOLN
Freq: Once | INTRAVENOUS | Status: AC
  Administered 2014-11-29: via INTRAVENOUS

## 2014-11-29 MED ORDER — IOHEXOL 300 MG/ML  SOLN
25.0000 mL | Freq: Once | INTRAMUSCULAR | Status: AC | PRN
  Administered 2014-11-29: 25 mL via ORAL

## 2014-11-29 MED ORDER — MORPHINE SULFATE 4 MG/ML IJ SOLN
4.0000 mg | Freq: Once | INTRAMUSCULAR | Status: AC
  Administered 2014-11-29: 4 mg via INTRAVENOUS
  Filled 2014-11-29: qty 1

## 2014-11-29 MED ORDER — ONDANSETRON HCL 4 MG/2ML IJ SOLN
4.0000 mg | Freq: Once | INTRAMUSCULAR | Status: AC
  Administered 2014-11-29: 4 mg via INTRAVENOUS
  Filled 2014-11-29: qty 2

## 2014-11-29 NOTE — ED Notes (Signed)
Pt c/o generalized weakness and abdominal pain x 4 days. Symptoms associated with NV and one episode of loose stools. Pt recently treated for shingles to L side of face. All areas healed with some crusted over. Pt completed acyclovir treatment

## 2014-11-29 NOTE — ED Notes (Signed)
Pt to ED from [facility] c/o generalized weakness x 4 days. Reports NV also. Initial pressure 96/72, bp improved after to 112/70

## 2014-11-29 NOTE — ED Provider Notes (Signed)
CSN: [id number]     Arrival date & time 11/29/14  2211 History   First MD Initiated Contact with Patient 11/29/14 2301     Chief Complaint  Patient presents with  . Fatigue  . Abdominal Pain     (Consider location/radiation/quality/duration/timing/severity/associated sxs/prior Treatment) HPI Comments: Patient is a 49 yo M PMHx significant for back pain presenting to the ED for 4 days of worsening fatigue, generalized weakness, nausea, nonbloody nonbilious vomiting. Patient also endorses abdominal pain associated as well. He had one episode of loose stools otherwise has been constipated. The wife reports that the patient has been fatigued with generalized weakness since being treated for facial shingles at the end of June. He completed acyclovir treatment at that time. No modifying factors identified. Denies any fevers, urinary symptoms. No abdominal surgical history. Denies any chest pain or shortness of breath.   Past Medical History  Diagnosis Date  . Back pain    Past Surgical History  Procedure Laterality Date  . Back surgery     History reviewed. No pertinent family history. History  Substance Use Topics  . Smoking status: Never Smoker   . Smokeless tobacco: Not on file  . Alcohol Use: No    Review of Systems  Constitutional: Positive for fatigue.  Gastrointestinal: Positive for nausea, vomiting and abdominal pain.  Neurological: Positive for weakness (generalized).  All other systems reviewed and are negative.     Allergies  Review of patient's allergies indicates no known allergies.  Home Medications   Prior to Admission medications   Medication Sig Start Date End Date Taking? Authorizing Provider  diphenhydramine-acetaminophen (TYLENOL PM) 25-500 MG TABS Take 2 tablets by mouth at bedtime as needed (for pain).   Yes Historical Provider, MD  ibuprofen (ADVIL,MOTRIN) 200 MG tablet Take 600 mg by mouth every 6 (six) hours as needed for moderate pain.   Yes  Historical Provider, MD  naproxen (NAPROSYN) 250 MG tablet Take 1 tablet (250 mg total) by mouth 2 (two) times daily with a meal. Patient taking differently: Take 250 mg by mouth 2 (two) times daily as needed for moderate pain.  10/21/14  Yes Everlene Farrier, PA-C  Sennosides-Docusate Sodium (STOOL SOFTENER & LAXATIVE PO) Take 2 tablets by mouth at bedtime as needed (for constipation).   Yes Historical Provider, MD  acyclovir (ZOVIRAX) 400 MG tablet Take 2 tablets (800 mg total) by mouth 5 (five) times daily. Patient not taking: Reported on 11/30/2014 10/21/14   Everlene Farrier, PA-C  HYDROcodone-acetaminophen (NORCO/VICODIN) 5-325 MG per tablet Take 1-2 tablets by mouth every 4 (four) hours as needed. Patient not taking: Reported on 11/30/2014 10/21/14   Everlene Farrier, PA-C  ondansetron (ZOFRAN ODT) 4 MG disintegrating tablet Take 1 tablet (4 mg total) by mouth every 8 (eight) hours as needed for nausea or vomiting. Patient not taking: Reported on 11/30/2014 10/21/14   Everlene Farrier, PA-C  ondansetron (ZOFRAN ODT) 4 MG disintegrating tablet Take 1 tablet (4 mg total) by mouth every 8 (eight) hours as needed for nausea or vomiting. 11/30/14   Kupono Marling, PA-C  tamsulosin (FLOMAX) 0.4 MG CAPS capsule Take 1 capsule (0.4 mg total) by mouth daily. 11/30/14   Quinteria Chisum, PA-C   BP 106/67 mmHg  Pulse 89  Temp(Src) 97.8 F (36.6 C) (Oral)  Resp 16  SpO2 100% Physical Exam  Constitutional: He is oriented to person, place, and time. He appears well-developed and well-nourished. No distress.  HENT:  Head: Normocephalic and atraumatic.  Right Ear: External  ear normal.  Left Ear: External ear normal.  Nose: Nose normal.  Eyes: Conjunctivae are normal.  Neck: Neck supple.  Cardiovascular: Normal rate, regular rhythm and normal heart sounds.   Pulmonary/Chest: Effort normal and breath sounds normal.  Abdominal: Soft. Bowel sounds are normal. He exhibits distension. There is tenderness. There  is no rebound and no guarding.  Musculoskeletal: Normal range of motion.  Neurological: He is alert and oriented to person, place, and time. No cranial nerve deficit.  Sensation grossly intact. Strength intact.   Skin: Skin is warm and dry. He is not diaphoretic.  Nursing note and vitals reviewed.   ED Course  Procedures (including critical care time) Medications  promethazine (PHENERGAN) injection 25 mg (not administered)  morphine 4 MG/ML injection 4 mg (4 mg Intravenous Given 11/29/14 2330)  ondansetron (ZOFRAN) injection 4 mg (4 mg Intravenous Given 11/29/14 2330)  iohexol (OMNIPAQUE) 300 MG/ML solution 25 mL (25 mLs Oral Contrast Given 11/29/14 2355)  0.9 %  sodium chloride infusion ( Intravenous Stopped 11/30/14 0130)  iohexol (OMNIPAQUE) 300 MG/ML solution 100 mL (100 mLs Intravenous Contrast Given 11/30/14 0021)  morphine 4 MG/ML injection 4 mg (4 mg Intravenous Given 11/30/14 0317)  LORazepam (ATIVAN) injection 1 mg (1 mg Intravenous Given 11/30/14 0219)  gadobenate dimeglumine (MULTIHANCE) injection 20 mL (20 mLs Intravenous Contrast Given 11/30/14 0309)    Labs Review Labs Reviewed  BASIC METABOLIC PANEL - Abnormal; Notable for the following:    Sodium 124 (*)    Potassium 3.4 (*)    Chloride 96 (*)    CO2 19 (*)    Glucose, Bld 114 (*)    Calcium 8.6 (*)    All other components within normal limits  CBC - Abnormal; Notable for the following:    RBC 3.85 (*)    Hemoglobin 12.9 (*)    HCT 35.5 (*)    MCHC 36.3 (*)    All other components within normal limits  OSMOLALITY, URINE - Abnormal; Notable for the following:    Osmolality, Ur 140 (*)    All other components within normal limits  HEPATIC FUNCTION PANEL - Abnormal; Notable for the following:    Total Protein 6.0 (*)    Albumin 3.2 (*)    ALT 12 (*)    All other components within normal limits  BASIC METABOLIC PANEL - Abnormal; Notable for the following:    Sodium 127 (*)    Chloride 99 (*)    CO2 20 (*)    Glucose,  Bld 115 (*)    BUN 5 (*)    All other components within normal limits  URINE CULTURE  URINALYSIS, ROUTINE W REFLEX MICROSCOPIC (NOT AT [facility])  LIPASE, BLOOD  CBG MONITORING, ED    Imaging Review Mr Lumbar Spine W Wo Contrast  11/30/2014   CLINICAL DATA:  Initial evaluation for acute back pain.  EXAM: MRI LUMBAR SPINE WITHOUT AND WITH CONTRAST  TECHNIQUE: Multiplanar and multiecho pulse sequences of the lumbar spine were obtained without and with intravenous contrast.  CONTRAST:  20mL MULTIHANCE GADOBENATE DIMEGLUMINE 529 MG/ML IV SOLN  COMPARISON:  Prior MRI from 05/20/2009  FINDINGS: For the purposes of this dictation, the lowest well-formed intervertebral disc spaces presumed to be the L5-S1 level, and there presumed to be 5 lumbar type vertebral bodies.  Transitional lumbosacral anatomy with partial sacralization of the L5 vertebral body. In keeping with prior studies, the transitional segment is labeled L5. Alignment is stable with preservation of the normal lumbar  lordosis. Vertebral body heights preserved. No fracture or listhesis. No marrow edema. Diffusely decreased marrow signal intensity is stable from previous studies.  Conus medullaris terminates normally at the T12 level. Signal intensity within the visualized cord is normal. Nerve roots of the cauda equina within normal limits.  Postoperative changes from prior posterior decompression again seen at L4-5.  Diffuse congenital shortening of the pedicles noted.  Paraspinous soft tissues within normal limits. Urinary bladder distension noted.  No abnormal enhancement.  T11-12:  Negative.  T12-L1:  Negative.  L1-2: Tiny central disc protrusion with associated annular fissure. No canal or foraminal stenosis.  L2-3: Mild diffuse annular disc bulge. Facet and ligamentous hypertrophy. No significant canal or foraminal stenosis.  L3-4: Mild diffuse disc bulge. Superimposed facet and ligamentous hypertrophy. No significant canal or foraminal stenosis.   L4-5: Postoperative changes from prior decompressive wide laminectomy bilaterally. Thecal sac is widely patent without residual stenosis. Probable small amount of enhancing granulation tissue within the left ventral epidural space. Degenerative disc bulging with disc desiccation and associated endplate changes present. No neural impingement. Mild left foraminal narrowing related to disc bulge.  L5-S1: Transitional disc space without disc bulge or disc protrusion. Mild bilateral facet hypertrophy. No stenosis.  IMPRESSION: 1. Overall stable appearance of the lumbar spine with sequelae of prior decompressive laminectomy at L4-5. No recurrent stenosis at this level. 2. Congenital spinal stenosis with superimposed mild multilevel degenerative changes as above, overall relatively similar to prior MRI from 2011. No severe canal stenosis or evidence of cord compression. 3. Distended urinary bladder.   Electronically Signed   By: Rise Mu M.D.   On: 11/30/2014 03:53   Ct Abdomen Pelvis W Contrast  11/30/2014   CLINICAL DATA:  Generalized weakness for 4 days. Nausea and vomiting. Abdominal pain.  EXAM: CT ABDOMEN AND PELVIS WITH CONTRAST  TECHNIQUE: Multidetector CT imaging of the abdomen and pelvis was performed using the standard protocol following bolus administration of intravenous contrast.  CONTRAST:  OMNIPAQUE IOHEXOL 300 MG/ML  SOLN  COMPARISON:  None.  FINDINGS: Lower chest:  The included lung bases are clear.  Liver: No focal lesion.  Hepatobiliary: Gallbladder mildly distended without pericholecystic inflammatory change. No common bowel duct dilatation. No intrahepatic biliary dilatation.  Pancreas: Normal.  Spleen: Normal.  Adrenal glands: No nodule.  Kidneys: No hydronephrosis. Mildly prominent distal ureters, likely related to distended bladder. No localizing or focal renal abnormality. Minimally heterogeneous appearance of the cortex of the upper left kidney without discrete lesion.   Stomach/Bowel: Stomach physiologically distended. There are no dilated or thickened small bowel loops. Liquid stool throughout the colon without colonic wall thickening. The appendix is normal.  Vascular/Lymphatic: No retroperitoneal adenopathy. Abdominal aorta is normal in caliber. Mild moderate atherosclerosis of the distal abdominal aorta and iliac branches.  Reproductive: Prostate gland is normal in size.  Bladder: Distended.  Other: No free air, free fluid, or intra-abdominal fluid collection. Fat containing umbilical hernia.  Musculoskeletal: There are no acute or suspicious osseous abnormalities. Transitional lumbosacral anatomy is seen. Postsurgical and degenerative change at L4-L5.  IMPRESSION: 1. Minimal heterogeneity of the upper pole of the left renal cortex, can be seen in the setting of pyelonephritis. Small cortical lesions too small to characterize could have a similar appearance. Correlation with urinalysis recommended. The urinary bladder is distended. 2. Liquid stool in the colon without colonic wall thickening. 3. Gallbladder appears mildly distended but without CT findings of surrounding inflammation. 4. Fat containing umbilical hernia.   Electronically Signed  By: Rubye Oaks M.D.   On: 11/30/2014 00:51     EKG Interpretation None      MDM   Final diagnoses:  Back pain  Abdominal pain in male  Urinary retention    Filed Vitals:   11/30/14 0614  BP: 106/67  Pulse: 89  Temp: 97.8 F (36.6 C)  Resp: 16   Afebrile, NAD, non-toxic appearing, AAOx4.   I have reviewed nursing notes, vital signs, and all lab and all imaging results as noted above.  Patient presenting with four days of worsening fatigue and generalized weakness with abdominal pain, n/v. On examination patient in uncomfortably appearing. No neurofocal deficits. Abdomen is soft, diffusely tender, with distention. Labs reviewed, hyponatremia noted. Gentle rehydration ordered. Bladder scanner reveals >  of urine, cath sample obtained with improvement of symptoms. CT abdomen/pelvis reviewed bladder distention noted. MR ordered given degree of urinary retention with recent shingles infection, results reviewed. Patient is vastly improved. Able to ambulate without difficulty and urinate without difficulty. Repeat BMP with improvement. Patient is requesting discharge, agree patient can be sent home with advised PCP and urology follow up. Return precautions discussed. Patient is agreeable to plan. Patient is stable at time of discharge. Patient d/w with Dr. Littie Deeds, agrees with plan.      Francee Piccolo, PA-C 11/30/14 1610  Mirian Mo, MD 12/03/14 [phone]

## 2014-11-30 ENCOUNTER — Emergency Department (HOSPITAL_COMMUNITY): Payer: Medicare Other

## 2014-11-30 ENCOUNTER — Encounter (HOSPITAL_COMMUNITY): Payer: Self-pay

## 2014-11-30 DIAGNOSIS — M5126 Other intervertebral disc displacement, lumbar region: Secondary | ICD-10-CM | POA: Diagnosis not present

## 2014-11-30 DIAGNOSIS — M4806 Spinal stenosis, lumbar region: Secondary | ICD-10-CM | POA: Diagnosis not present

## 2014-11-30 DIAGNOSIS — K828 Other specified diseases of gallbladder: Secondary | ICD-10-CM | POA: Diagnosis not present

## 2014-11-30 LAB — BASIC METABOLIC PANEL
Anion gap: 8 (ref 5–15)
BUN: 5 mg/dL — ABNORMAL LOW (ref 6–20)
CO2: 20 mmol/L — ABNORMAL LOW (ref 22–32)
Calcium: 9.1 mg/dL (ref 8.9–10.3)
Chloride: 99 mmol/L — ABNORMAL LOW (ref 101–111)
Creatinine, Ser: 0.81 mg/dL (ref 0.61–1.24)
GFR calc Af Amer: >60 mL/min (ref >60)
GFR calc non Af Amer: >60 mL/min (ref >60)
Glucose, Bld: 115 mg/dL — ABNORMAL HIGH (ref 65–99)
Potassium: 3.9 mmol/L (ref 3.5–5.1)
Sodium: 127 mmol/L — ABNORMAL LOW (ref 135–145)

## 2014-11-30 LAB — URINALYSIS, ROUTINE W REFLEX MICROSCOPIC
Bilirubin Urine: NEGATIVE
Glucose, UA: NEGATIVE mg/dL
Hgb urine dipstick: NEGATIVE
Ketones, ur: NEGATIVE mg/dL
Leukocytes, UA: NEGATIVE
Nitrite: NEGATIVE
Protein, ur: NEGATIVE mg/dL
Specific Gravity, Urine: 1.01 (ref 1.005–1.030)
Urobilinogen, UA: 0.2 mg/dL (ref 0.0–1.0)
pH: 6 (ref 5.0–8.0)

## 2014-11-30 LAB — LIPASE, BLOOD: Lipase: 29 U/L (ref 22–51)

## 2014-11-30 LAB — HEPATIC FUNCTION PANEL
ALT: 12 U/L — ABNORMAL LOW (ref 17–63)
AST: 15 U/L (ref 15–41)
Albumin: 3.2 g/dL — ABNORMAL LOW (ref 3.5–5.0)
Alkaline Phosphatase: 48 U/L (ref 38–126)
Bilirubin, Direct: 0.2 mg/dL (ref 0.1–0.5)
Indirect Bilirubin: 0.5 mg/dL (ref 0.3–0.9)
Total Bilirubin: 0.7 mg/dL (ref 0.3–1.2)
Total Protein: 6 g/dL — ABNORMAL LOW (ref 6.5–8.1)

## 2014-11-30 LAB — OSMOLALITY, URINE: Osmolality, Ur: 140 mOsm/kg — ABNORMAL LOW (ref 390–1090)

## 2014-11-30 MED ORDER — PROMETHAZINE HCL 25 MG/ML IJ SOLN
25.0000 mg | Freq: Once | INTRAMUSCULAR | Status: DC | PRN
  Filled 2014-11-30: qty 1

## 2014-11-30 MED ORDER — LORAZEPAM 2 MG/ML IJ SOLN
1.0000 mg | Freq: Once | INTRAMUSCULAR | Status: AC
  Administered 2014-11-30: 1 mg via INTRAVENOUS
  Filled 2014-11-30: qty 1

## 2014-11-30 MED ORDER — GADOBENATE DIMEGLUMINE 529 MG/ML IV SOLN
20.0000 mL | Freq: Once | INTRAVENOUS | Status: AC | PRN
  Administered 2014-11-30: 20 mL via INTRAVENOUS

## 2014-11-30 MED ORDER — MORPHINE SULFATE 4 MG/ML IJ SOLN
4.0000 mg | Freq: Once | INTRAMUSCULAR | Status: AC | PRN
  Administered 2014-11-30: 4 mg via INTRAVENOUS
  Filled 2014-11-30: qty 1

## 2014-11-30 MED ORDER — TAMSULOSIN HCL 0.4 MG PO CAPS: 0.4000 mg | ORAL_CAPSULE | Freq: Every day | ORAL | Status: DC

## 2014-11-30 MED ORDER — ONDANSETRON 4 MG PO TBDP: 4.0000 mg | ORAL_TABLET | Freq: Three times a day (TID) | ORAL | Status: DC | PRN

## 2014-11-30 MED ORDER — IOHEXOL 300 MG/ML  SOLN
100.0000 mL | Freq: Once | INTRAMUSCULAR | Status: AC | PRN
  Administered 2014-11-30: 100 mL via INTRAVENOUS

## 2014-11-30 NOTE — ED Notes (Signed)
CT contacted that pt has finished with contrast

## 2014-11-30 NOTE — ED Notes (Signed)
Dr. Gentry at bedside. 

## 2014-11-30 NOTE — ED Notes (Signed)
Pt ambulatory to restroom with steady gait; able to urinate.

## 2014-11-30 NOTE — ED Notes (Signed)
Pt reports attempted to use urinal several times, unable to urinate

## 2014-11-30 NOTE — Discharge Instructions (Signed)
Please follow up with your primary care physician in 1-2 days. If you do not have one please call the [facility] number listed above. Please follow up with Dr. Berneice Heinrich to schedule a follow up appointment.  Please read all discharge instructions and return precautions.   Acute Urinary Retention Acute urinary retention is the temporary inability to urinate. This is a common problem in older men. As men age their prostates become larger and block the flow of urine from the bladder. This is usually a problem that has come on gradually.  HOME CARE INSTRUCTIONS If you are sent home with a Foley catheter and a drainage system, you will need to discuss the best course of action with your health care provider. While the catheter is in, maintain a good intake of fluids. Keep the drainage bag emptied and lower than your catheter. This is so that contaminated urine will not flow back into your bladder, which could lead to a urinary tract infection. There are two main types of drainage bags. One is a large bag that usually is used at night. It has a good capacity that will allow you to sleep through the night without having to empty it. The second type is called a leg bag. It has a smaller capacity, so it needs to be emptied more frequently. However, the main advantage is that it can be attached by a leg strap and can go underneath your clothing, allowing you the freedom to move about or leave your home. Only take over-the-counter or prescription medicines for pain, discomfort, or fever as directed by your health care provider.  SEEK MEDICAL CARE IF:  You develop a low-grade fever.  You experience spasms or leakage of urine with the spasms. SEEK IMMEDIATE MEDICAL CARE IF:   You develop chills or fever.  Your catheter stops draining urine.  Your catheter falls out.  You start to develop increased bleeding that does not respond to rest and increased fluid intake. MAKE SURE  YOU:  Understand these instructions.  Will watch your condition.  Will get help right away if you are not doing well or get worse. Document Released: 07/24/2000 Document Revised: 04/22/2013 Document Reviewed: 09/26/2012 [facility] Patient Information 2015 [place], [place]. This information is not intended to replace advice given to you by your health care provider. Make sure you discuss any questions you have with your health care provider.  Abdominal Pain Many things can cause abdominal pain. Usually, abdominal pain is not caused by a disease and will improve without treatment. It can often be observed and treated at home. Your health care provider will do a physical exam and possibly order blood tests and X-rays to help determine the seriousness of your pain. However, in many cases, more time must pass before a clear cause of the pain can be found. Before that point, your health care provider may not know if you need more testing or further treatment. HOME CARE INSTRUCTIONS  Monitor your abdominal pain for any changes. The following actions may help to alleviate any discomfort you are experiencing:  Only take over-the-counter or prescription medicines as directed by your health care provider.  Do not take laxatives unless directed to do so by your health care provider.  Try a clear liquid diet (broth, tea, or water) as directed by your health care provider. Slowly move to a bland diet as tolerated. SEEK MEDICAL CARE IF:  You have unexplained abdominal pain.  You have abdominal pain associated with nausea  or diarrhea.  You have pain when you urinate or have a bowel movement.  You experience abdominal pain that wakes you in the night.  You have abdominal pain that is worsened or improved by eating food.  You have abdominal pain that is worsened with eating fatty foods.  You have a fever. SEEK IMMEDIATE MEDICAL CARE IF:   Your pain does not go away within 2 hours.  You keep  throwing up (vomiting).  Your pain is felt only in portions of the abdomen, such as the right side or the left lower portion of the abdomen.  You pass bloody or black tarry stools. MAKE SURE YOU:  Understand these instructions.   Will watch your condition.   Will get help right away if you are not doing well or get worse.  Document Released: 01/25/2005 Document Revised: 04/22/2013 Document Reviewed: 12/25/2012 [facility] Patient Information 2015 [place], [place]. This information is not intended to replace advice given to you by your health care provider. Make sure you discuss any questions you have with your health care provider.

## 2014-12-01 ENCOUNTER — Encounter (HOSPITAL_COMMUNITY): Payer: Self-pay | Admitting: *Deleted

## 2014-12-01 ENCOUNTER — Inpatient Hospital Stay (HOSPITAL_COMMUNITY)
Admission: EM | Admit: 2014-12-01 | Discharge: 2014-12-03 | DRG: 728 | Disposition: A | Discharge status: Home / Self Care | Payer: Medicare Other | Attending: Family Medicine | Admitting: Family Medicine

## 2014-12-01 DIAGNOSIS — N419 Inflammatory disease of prostate, unspecified: Secondary | ICD-10-CM | POA: Diagnosis present

## 2014-12-01 DIAGNOSIS — R338 Other retention of urine: Secondary | ICD-10-CM | POA: Insufficient documentation

## 2014-12-01 DIAGNOSIS — M545 Low back pain, unspecified: Secondary | ICD-10-CM | POA: Insufficient documentation

## 2014-12-01 DIAGNOSIS — R339 Retention of urine, unspecified: Secondary | ICD-10-CM | POA: Diagnosis not present

## 2014-12-01 DIAGNOSIS — Z888 Allergy status to other drugs, medicaments and biological substances status: Secondary | ICD-10-CM

## 2014-12-01 DIAGNOSIS — E871 Hypo-osmolality and hyponatremia: Secondary | ICD-10-CM | POA: Diagnosis not present

## 2014-12-01 DIAGNOSIS — K2970 Gastritis, unspecified, without bleeding: Secondary | ICD-10-CM | POA: Diagnosis not present

## 2014-12-01 DIAGNOSIS — E119 Type 2 diabetes mellitus without complications: Secondary | ICD-10-CM

## 2014-12-01 DIAGNOSIS — M5431 Sciatica, right side: Secondary | ICD-10-CM

## 2014-12-01 DIAGNOSIS — D649 Anemia, unspecified: Secondary | ICD-10-CM | POA: Diagnosis not present

## 2014-12-01 DIAGNOSIS — R29898 Other symptoms and signs involving the musculoskeletal system: Secondary | ICD-10-CM

## 2014-12-01 DIAGNOSIS — K5900 Constipation, unspecified: Secondary | ICD-10-CM

## 2014-12-01 DIAGNOSIS — G8929 Other chronic pain: Secondary | ICD-10-CM | POA: Diagnosis present

## 2014-12-01 DIAGNOSIS — R1031 Right lower quadrant pain: Secondary | ICD-10-CM | POA: Diagnosis not present

## 2014-12-01 DIAGNOSIS — B0230 Zoster ocular disease, unspecified: Secondary | ICD-10-CM | POA: Diagnosis present

## 2014-12-01 DIAGNOSIS — M549 Dorsalgia, unspecified: Secondary | ICD-10-CM | POA: Insufficient documentation

## 2014-12-01 DIAGNOSIS — N410 Acute prostatitis: Secondary | ICD-10-CM | POA: Insufficient documentation

## 2014-12-01 DIAGNOSIS — I951 Orthostatic hypotension: Secondary | ICD-10-CM | POA: Diagnosis not present

## 2014-12-01 DIAGNOSIS — K429 Umbilical hernia without obstruction or gangrene: Secondary | ICD-10-CM | POA: Diagnosis not present

## 2014-12-01 DIAGNOSIS — K828 Other specified diseases of gallbladder: Secondary | ICD-10-CM | POA: Diagnosis not present

## 2014-12-01 DIAGNOSIS — Z87891 Personal history of nicotine dependence: Secondary | ICD-10-CM

## 2014-12-01 DIAGNOSIS — E876 Hypokalemia: Secondary | ICD-10-CM

## 2014-12-01 DIAGNOSIS — R634 Abnormal weight loss: Secondary | ICD-10-CM | POA: Diagnosis present

## 2014-12-01 DIAGNOSIS — M5126 Other intervertebral disc displacement, lumbar region: Secondary | ICD-10-CM | POA: Diagnosis not present

## 2014-12-01 DIAGNOSIS — R10813 Right lower quadrant abdominal tenderness: Secondary | ICD-10-CM | POA: Diagnosis not present

## 2014-12-01 DIAGNOSIS — R1030 Lower abdominal pain, unspecified: Secondary | ICD-10-CM | POA: Diagnosis present

## 2014-12-01 DIAGNOSIS — Z79899 Other long term (current) drug therapy: Secondary | ICD-10-CM

## 2014-12-01 DIAGNOSIS — R109 Unspecified abdominal pain: Secondary | ICD-10-CM | POA: Diagnosis present

## 2014-12-01 DIAGNOSIS — M4806 Spinal stenosis, lumbar region: Secondary | ICD-10-CM | POA: Diagnosis not present

## 2014-12-01 LAB — CBC WITH DIFFERENTIAL/PLATELET
Basophils Absolute: 0 10*3/uL (ref 0.0–0.1)
Basophils Relative: 1 % (ref 0–1)
Eosinophils Absolute: 0.2 10*3/uL (ref 0.0–0.7)
Eosinophils Relative: 4 % (ref 0–5)
HCT: 34.6 % — ABNORMAL LOW (ref 39.0–52.0)
Hemoglobin: 12.5 g/dL — ABNORMAL LOW (ref 13.0–17.0)
Lymphocytes Relative: 37 % (ref 12–46)
Lymphs Abs: 2.1 10*3/uL (ref 0.7–4.0)
MCH: 33.2 pg (ref 26.0–34.0)
MCHC: 36.1 g/dL — ABNORMAL HIGH (ref 30.0–36.0)
MCV: 92 fL (ref 78.0–100.0)
Monocytes Absolute: 0.5 10*3/uL (ref 0.1–1.0)
Monocytes Relative: 9 % (ref 3–12)
Neutro Abs: 2.9 10*3/uL (ref 1.7–7.7)
Neutrophils Relative %: 49 % (ref 43–77)
Platelets: 239 10*3/uL (ref 150–400)
RBC: 3.76 MIL/uL — ABNORMAL LOW (ref 4.22–5.81)
RDW: 12.5 % (ref 11.5–15.5)
WBC: 5.7 10*3/uL (ref 4.0–10.5)

## 2014-12-01 LAB — COMPREHENSIVE METABOLIC PANEL
ALT: 14 U/L — ABNORMAL LOW (ref 17–63)
AST: 19 U/L (ref 15–41)
Albumin: 3.9 g/dL (ref 3.5–5.0)
Alkaline Phosphatase: 56 U/L (ref 38–126)
Anion gap: 12 (ref 5–15)
BUN: 6 mg/dL (ref 6–20)
CO2: 20 mmol/L — ABNORMAL LOW (ref 22–32)
Calcium: 9.5 mg/dL (ref 8.9–10.3)
Chloride: 97 mmol/L — ABNORMAL LOW (ref 101–111)
Creatinine, Ser: 0.84 mg/dL (ref 0.61–1.24)
GFR calc Af Amer: >60 mL/min (ref >60)
GFR calc non Af Amer: >60 mL/min (ref >60)
Glucose, Bld: 114 mg/dL — ABNORMAL HIGH (ref 65–99)
Potassium: 3.4 mmol/L — ABNORMAL LOW (ref 3.5–5.1)
Sodium: 129 mmol/L — ABNORMAL LOW (ref 135–145)
Total Bilirubin: 0.8 mg/dL (ref 0.3–1.2)
Total Protein: 6.7 g/dL (ref 6.5–8.1)

## 2014-12-01 LAB — URINALYSIS, ROUTINE W REFLEX MICROSCOPIC
Bilirubin Urine: NEGATIVE
Glucose, UA: NEGATIVE mg/dL
Hgb urine dipstick: NEGATIVE
Ketones, ur: NEGATIVE mg/dL
Leukocytes, UA: NEGATIVE
Nitrite: NEGATIVE
Protein, ur: NEGATIVE mg/dL
Specific Gravity, Urine: 1.002 — ABNORMAL LOW (ref 1.005–1.030)
Urobilinogen, UA: 1 mg/dL (ref 0.0–1.0)
pH: 6.5 (ref 5.0–8.0)

## 2014-12-01 LAB — URINE CULTURE: Culture: NO GROWTH

## 2014-12-01 MED ORDER — SODIUM CHLORIDE 0.9 % IV BOLUS (SEPSIS)
1000.0000 mL | Freq: Once | INTRAVENOUS | Status: AC
  Administered 2014-12-01: 1000 mL via INTRAVENOUS

## 2014-12-01 MED ORDER — HYDROMORPHONE HCL 1 MG/ML IJ SOLN
1.0000 mg | Freq: Once | INTRAMUSCULAR | Status: AC
  Administered 2014-12-01: 1 mg via INTRAVENOUS
  Filled 2014-12-01: qty 1

## 2014-12-01 MED ORDER — MORPHINE SULFATE 4 MG/ML IJ SOLN
4.0000 mg | Freq: Once | INTRAMUSCULAR | Status: AC
  Administered 2014-12-01: 4 mg via INTRAVENOUS
  Filled 2014-12-01: qty 1

## 2014-12-01 NOTE — ED Notes (Signed)
Dr. Wofford at bedside 

## 2014-12-01 NOTE — ED Notes (Addendum)
Pt is standing next to bed, leaning with head on bed, stating that he is in pain. - Dr. Loretha Stapler notified.

## 2014-12-01 NOTE — ED Notes (Signed)
Pt was seen here on Sunday, Released on Monday. Pt was given morphine for his abdominal pain ,the pain eased off. Monday after leaving there was no pain., Pt began to have the same pain but worse today. Pt brought back to ED for further eval.  VS per EMS are as follows: BP: 120/80 HR: 90 SPO2: 96% on RA

## 2014-12-01 NOTE — ED Provider Notes (Signed)
CSN: [id number]     Arrival date & time 12/01/14  1843 History   First MD Initiated Contact with Patient 12/01/14 1914     Chief Complaint  Patient presents with  . Abdominal Pain     (Consider location/radiation/quality/duration/timing/severity/associated sxs/prior Treatment) Patient is a 49 y.o. male presenting with abdominal pain.  Abdominal Pain Pain location:  L flank Pain quality: aching and squeezing   Pain radiates to:  Does not radiate Pain severity:  Severe Onset quality:  Gradual Duration:  1 week Timing:  Constant Progression:  Worsening Chronicity:  New Context comment:  Seen in ED yesterday and had CT A/P and L spine MRI.   Relieved by:  Nothing Worsened by:  Nothing tried Associated symptoms: diarrhea (after taking laxatives) and nausea   Associated symptoms: no fever and no vomiting   Associated symptoms comment:  Urinary retention   Past Medical History  Diagnosis Date  . Back pain    Past Surgical History  Procedure Laterality Date  . Back surgery     No family history on file. History  Substance Use Topics  . Smoking status: Never Smoker   . Smokeless tobacco: Not on file  . Alcohol Use: No    Review of Systems  Constitutional: Negative for fever.  Gastrointestinal: Positive for nausea, abdominal pain and diarrhea (after taking laxatives). Negative for vomiting.  All other systems reviewed and are negative.     Allergies  Review of patient's allergies indicates no known allergies.  Home Medications   Prior to Admission medications   Medication Sig Start Date End Date Taking? Authorizing Provider  acyclovir (ZOVIRAX) 400 MG tablet Take 2 tablets (800 mg total) by mouth 5 (five) times daily. Patient not taking: Reported on 11/30/2014 10/21/14   Everlene Farrier, PA-C  diphenhydramine-acetaminophen (TYLENOL PM) 25-500 MG TABS Take 2 tablets by mouth at bedtime as needed (for pain).    Historical Provider, MD  HYDROcodone-acetaminophen  (NORCO/VICODIN) 5-325 MG per tablet Take 1-2 tablets by mouth every 4 (four) hours as needed. Patient not taking: Reported on 11/30/2014 10/21/14   Everlene Farrier, PA-C  ibuprofen (ADVIL,MOTRIN) 200 MG tablet Take 600 mg by mouth every 6 (six) hours as needed for moderate pain.    Historical Provider, MD  naproxen (NAPROSYN) 250 MG tablet Take 1 tablet (250 mg total) by mouth 2 (two) times daily with a meal. Patient taking differently: Take 250 mg by mouth 2 (two) times daily as needed for moderate pain.  10/21/14   Everlene Farrier, PA-C  ondansetron (ZOFRAN ODT) 4 MG disintegrating tablet Take 1 tablet (4 mg total) by mouth every 8 (eight) hours as needed for nausea or vomiting. Patient not taking: Reported on 11/30/2014 10/21/14   Everlene Farrier, PA-C  ondansetron (ZOFRAN ODT) 4 MG disintegrating tablet Take 1 tablet (4 mg total) by mouth every 8 (eight) hours as needed for nausea or vomiting. 11/30/14   Jennifer Piepenbrink, PA-C  Sennosides-Docusate Sodium (STOOL SOFTENER & LAXATIVE PO) Take 2 tablets by mouth at bedtime as needed (for constipation).    Historical Provider, MD  tamsulosin (FLOMAX) 0.4 MG CAPS capsule Take 1 capsule (0.4 mg total) by mouth daily. 11/30/14   Jennifer Piepenbrink, PA-C   BP 118/79 mmHg  Pulse 98  Temp(Src) 98 F (36.7 C) (Oral)  Resp 20  Ht 5\' 11"  (1.803 m)  Wt 250 lb (113.399 kg)  BMI 34.88 kg/m2  SpO2 100% Physical Exam  Constitutional: He is oriented to person, place, and time. He  appears well-developed and well-nourished. He appears distressed (appears very uncomfortable).  HENT:  Head: Normocephalic and atraumatic.  Mouth/Throat: Oropharynx is clear and moist.  Eyes: Conjunctivae are normal. Pupils are equal, round, and reactive to light. No scleral icterus.  Neck: Neck supple.  Cardiovascular: Normal rate, regular rhythm, normal heart sounds and intact distal pulses.   No murmur heard. Pulmonary/Chest: Effort normal and breath sounds normal. No stridor. No  respiratory distress. He has no wheezes. He has no rales.  Abdominal: Soft. He exhibits no distension. There is tenderness. There is guarding.  Diffuse low abdominal tenderness  Musculoskeletal: Normal range of motion. He exhibits no edema.  Neurological: He is alert and oriented to person, place, and time.  Skin: Skin is warm and dry. No rash noted.  Psychiatric: He has a normal mood and affect. His behavior is normal.  Nursing note and vitals reviewed.   ED Course  Procedures (including critical care time) Labs Review Labs Reviewed  CBC WITH DIFFERENTIAL/PLATELET - Abnormal; Notable for the following:    RBC 3.76 (*)    Hemoglobin 12.5 (*)    HCT 34.6 (*)    MCHC 36.1 (*)    All other components within normal limits  COMPREHENSIVE METABOLIC PANEL  URINALYSIS, ROUTINE W REFLEX MICROSCOPIC (NOT AT [facility])    Imaging Review Ct Abdomen Pelvis Wo Contrast  12/02/2014   CLINICAL DATA:  Recurrent abdominal pain.  Subsequent encounter.  EXAM: CT ABDOMEN AND PELVIS WITHOUT CONTRAST  TECHNIQUE: Multidetector CT imaging of the abdomen and pelvis was performed following the standard protocol without IV contrast.  COMPARISON:  11/30/2014  FINDINGS: There are unremarkable unenhanced appearances of the liver, spleen, pancreas, adrenals and kidneys. There is no hydronephrosis. Ureters are unremarkable. Urinary bladder is collapsed around a Foley catheter, grossly unremarkable. There are normal appearances of the stomach, small bowel and colon. The appendix is normal. The abdominal aorta is normal in caliber. There is mild atherosclerotic calcification. There is no adenopathy in the abdomen or pelvis.  No acute inflammatory changes are evident in the abdomen or pelvis. There is no bowel obstruction. There is no extraluminal air.  There is a fat containing umbilical hernia.  There is no significant abnormality in the lower chest.  There is no significant skeletal lesion. Mild degenerative disc changes are  present at L4-5.  IMPRESSION: No acute findings are evident in the abdomen or pelvis.  Fat containing umbilical hernia   Electronically Signed   By: Ellery Plunk M.D.   On: 12/02/2014 00:58   Mr Lumbar Spine W Wo Contrast  11/30/2014   CLINICAL DATA:  Initial evaluation for acute back pain.  EXAM: MRI LUMBAR SPINE WITHOUT AND WITH CONTRAST  TECHNIQUE: Multiplanar and multiecho pulse sequences of the lumbar spine were obtained without and with intravenous contrast.  CONTRAST:  20mL MULTIHANCE GADOBENATE DIMEGLUMINE 529 MG/ML IV SOLN  COMPARISON:  Prior MRI from 05/20/2009  FINDINGS: For the purposes of this dictation, the lowest well-formed intervertebral disc spaces presumed to be the L5-S1 level, and there presumed to be 5 lumbar type vertebral bodies.  Transitional lumbosacral anatomy with partial sacralization of the L5 vertebral body. In keeping with prior studies, the transitional segment is labeled L5. Alignment is stable with preservation of the normal lumbar lordosis. Vertebral body heights preserved. No fracture or listhesis. No marrow edema. Diffusely decreased marrow signal intensity is stable from previous studies.  Conus medullaris terminates normally at the T12 level. Signal intensity within the visualized cord is normal.  Nerve roots of the cauda equina within normal limits.  Postoperative changes from prior posterior decompression again seen at L4-5.  Diffuse congenital shortening of the pedicles noted.  Paraspinous soft tissues within normal limits. Urinary bladder distension noted.  No abnormal enhancement.  T11-12:  Negative.  T12-L1:  Negative.  L1-2: Tiny central disc protrusion with associated annular fissure. No canal or foraminal stenosis.  L2-3: Mild diffuse annular disc bulge. Facet and ligamentous hypertrophy. No significant canal or foraminal stenosis.  L3-4: Mild diffuse disc bulge. Superimposed facet and ligamentous hypertrophy. No significant canal or foraminal stenosis.  L4-5:  Postoperative changes from prior decompressive wide laminectomy bilaterally. Thecal sac is widely patent without residual stenosis. Probable small amount of enhancing granulation tissue within the left ventral epidural space. Degenerative disc bulging with disc desiccation and associated endplate changes present. No neural impingement. Mild left foraminal narrowing related to disc bulge.  L5-S1: Transitional disc space without disc bulge or disc protrusion. Mild bilateral facet hypertrophy. No stenosis.  IMPRESSION: 1. Overall stable appearance of the lumbar spine with sequelae of prior decompressive laminectomy at L4-5. No recurrent stenosis at this level. 2. Congenital spinal stenosis with superimposed mild multilevel degenerative changes as above, overall relatively similar to prior MRI from 2011. No severe canal stenosis or evidence of cord compression. 3. Distended urinary bladder.   Electronically Signed   By: Rise Mu M.D.   On: 11/30/2014 03:53  All radiology studies independently viewed by me.      EKG Interpretation None      MDM   Final diagnoses:  Abdominal pain, RLQ  Urinary retention    49 year old male presenting with abdominal pain. He was seen in the emergency department 2 nights ago at which time CT of his abdomen and pelvis and an MRI of his L-spine demonstrated bladder distention but no other significant acute findings.  Although his pain had resolved at time of discharge and he was able to urinate on his own, his symptoms recurred severely this morning.  His bladder was severely distended, but he received only minimal relief after Foley was placed. He is required multiple doses of IV narcotics for pain. His lab work was unremarkable, but he was in severe pain throughout his ED course. For this reason, I think he needs to be admitted for further workup. Family practice will admit.    Blake Divine, MD 12/02/14 [phone]

## 2014-12-01 NOTE — ED Notes (Signed)
Foley has been unclamped by this RN

## 2014-12-02 ENCOUNTER — Encounter (HOSPITAL_COMMUNITY): Payer: Self-pay | Admitting: Radiology

## 2014-12-02 ENCOUNTER — Emergency Department (HOSPITAL_COMMUNITY): Payer: Medicare Other

## 2014-12-02 DIAGNOSIS — M5431 Sciatica, right side: Secondary | ICD-10-CM | POA: Diagnosis present

## 2014-12-02 DIAGNOSIS — G8929 Other chronic pain: Secondary | ICD-10-CM | POA: Diagnosis present

## 2014-12-02 DIAGNOSIS — Z87891 Personal history of nicotine dependence: Secondary | ICD-10-CM | POA: Diagnosis not present

## 2014-12-02 DIAGNOSIS — K429 Umbilical hernia without obstruction or gangrene: Secondary | ICD-10-CM | POA: Diagnosis not present

## 2014-12-02 DIAGNOSIS — R339 Retention of urine, unspecified: Secondary | ICD-10-CM | POA: Diagnosis present

## 2014-12-02 DIAGNOSIS — R634 Abnormal weight loss: Secondary | ICD-10-CM | POA: Diagnosis not present

## 2014-12-02 DIAGNOSIS — R000 Tachycardia, unspecified: Secondary | ICD-10-CM | POA: Diagnosis not present

## 2014-12-02 DIAGNOSIS — D649 Anemia, unspecified: Secondary | ICD-10-CM | POA: Diagnosis present

## 2014-12-02 DIAGNOSIS — E876 Hypokalemia: Secondary | ICD-10-CM | POA: Diagnosis present

## 2014-12-02 DIAGNOSIS — E119 Type 2 diabetes mellitus without complications: Secondary | ICD-10-CM | POA: Diagnosis present

## 2014-12-02 DIAGNOSIS — N410 Acute prostatitis: Secondary | ICD-10-CM | POA: Diagnosis not present

## 2014-12-02 DIAGNOSIS — M25551 Pain in right hip: Secondary | ICD-10-CM | POA: Diagnosis not present

## 2014-12-02 DIAGNOSIS — R1030 Lower abdominal pain, unspecified: Secondary | ICD-10-CM | POA: Insufficient documentation

## 2014-12-02 DIAGNOSIS — R109 Unspecified abdominal pain: Secondary | ICD-10-CM | POA: Diagnosis present

## 2014-12-02 DIAGNOSIS — E2740 Unspecified adrenocortical insufficiency: Secondary | ICD-10-CM | POA: Diagnosis not present

## 2014-12-02 DIAGNOSIS — R1084 Generalized abdominal pain: Secondary | ICD-10-CM | POA: Diagnosis not present

## 2014-12-02 DIAGNOSIS — I951 Orthostatic hypotension: Secondary | ICD-10-CM | POA: Diagnosis not present

## 2014-12-02 DIAGNOSIS — M545 Low back pain, unspecified: Secondary | ICD-10-CM | POA: Insufficient documentation

## 2014-12-02 DIAGNOSIS — K5900 Constipation, unspecified: Secondary | ICD-10-CM | POA: Diagnosis not present

## 2014-12-02 DIAGNOSIS — B0230 Zoster ocular disease, unspecified: Secondary | ICD-10-CM | POA: Diagnosis present

## 2014-12-02 DIAGNOSIS — R9431 Abnormal electrocardiogram [ECG] [EKG]: Secondary | ICD-10-CM | POA: Diagnosis not present

## 2014-12-02 DIAGNOSIS — Z79899 Other long term (current) drug therapy: Secondary | ICD-10-CM | POA: Diagnosis not present

## 2014-12-02 DIAGNOSIS — E871 Hypo-osmolality and hyponatremia: Secondary | ICD-10-CM | POA: Diagnosis present

## 2014-12-02 LAB — CBC
HCT: 34 % — ABNORMAL LOW (ref 39.0–52.0)
Hemoglobin: 12.1 g/dL — ABNORMAL LOW (ref 13.0–17.0)
MCH: 33.9 pg (ref 26.0–34.0)
MCHC: 35.6 g/dL (ref 30.0–36.0)
MCV: 95.2 fL (ref 78.0–100.0)
Platelets: 202 10*3/uL (ref 150–400)
RBC: 3.57 MIL/uL — ABNORMAL LOW (ref 4.22–5.81)
RDW: 12.8 % (ref 11.5–15.5)
WBC: 5.2 10*3/uL (ref 4.0–10.5)

## 2014-12-02 LAB — BASIC METABOLIC PANEL
Anion gap: 7 (ref 5–15)
BUN: <5 mg/dL — ABNORMAL LOW (ref 6–20)
CO2: 23 mmol/L (ref 22–32)
Calcium: 9.1 mg/dL (ref 8.9–10.3)
Chloride: 104 mmol/L (ref 101–111)
Creatinine, Ser: 0.72 mg/dL (ref 0.61–1.24)
GFR calc Af Amer: >60 mL/min (ref >60)
GFR calc non Af Amer: >60 mL/min (ref >60)
Glucose, Bld: 120 mg/dL — ABNORMAL HIGH (ref 65–99)
Potassium: 3.7 mmol/L (ref 3.5–5.1)
Sodium: 134 mmol/L — ABNORMAL LOW (ref 135–145)

## 2014-12-02 LAB — I-STAT CG4 LACTIC ACID, ED: Lactic Acid, Venous: 0.94 mmol/L (ref 0.5–2.0)

## 2014-12-02 LAB — URINALYSIS, ROUTINE W REFLEX MICROSCOPIC
Bilirubin Urine: NEGATIVE
Glucose, UA: NEGATIVE mg/dL
Ketones, ur: NEGATIVE mg/dL
Nitrite: NEGATIVE
Protein, ur: NEGATIVE mg/dL
Specific Gravity, Urine: 1.007 (ref 1.005–1.030)
Urobilinogen, UA: 1 mg/dL (ref 0.0–1.0)
pH: 6.5 (ref 5.0–8.0)

## 2014-12-02 LAB — URINE MICROSCOPIC-ADD ON

## 2014-12-02 MED ORDER — SODIUM CHLORIDE 0.9 % IJ SOLN: 3.0000 mL | INTRAMUSCULAR | Status: DC | PRN

## 2014-12-02 MED ORDER — MORPHINE SULFATE 4 MG/ML IJ SOLN
4.0000 mg | Freq: Once | INTRAMUSCULAR | Status: AC
  Administered 2014-12-02: 4 mg via INTRAVENOUS
  Filled 2014-12-02: qty 1

## 2014-12-02 MED ORDER — ONDANSETRON HCL 4 MG/2ML IJ SOLN: 4.0000 mg | Freq: Four times a day (QID) | INTRAMUSCULAR | Status: DC | PRN

## 2014-12-02 MED ORDER — SODIUM CHLORIDE 0.9 % IV SOLN: 250.0000 mL | INTRAVENOUS | Status: DC | PRN

## 2014-12-02 MED ORDER — SULFAMETHOXAZOLE-TRIMETHOPRIM 800-160 MG PO TABS
1.0000 | ORAL_TABLET | Freq: Two times a day (BID) | ORAL | Status: DC
  Administered 2014-12-02 – 2014-12-03 (×4): 1 via ORAL
  Filled 2014-12-02 (×4): qty 1

## 2014-12-02 MED ORDER — ONDANSETRON HCL 4 MG PO TABS: 4.0000 mg | ORAL_TABLET | Freq: Four times a day (QID) | ORAL | Status: DC | PRN

## 2014-12-02 MED ORDER — SODIUM CHLORIDE 0.9 % IJ SOLN
3.0000 mL | Freq: Two times a day (BID) | INTRAMUSCULAR | Status: DC
  Administered 2014-12-02 – 2014-12-03 (×4): 3 mL via INTRAVENOUS

## 2014-12-02 MED ORDER — ACETAMINOPHEN 325 MG PO TABS: 650.0000 mg | ORAL_TABLET | Freq: Four times a day (QID) | ORAL | Status: DC | PRN

## 2014-12-02 MED ORDER — POTASSIUM CHLORIDE CRYS ER 20 MEQ PO TBCR
40.0000 meq | EXTENDED_RELEASE_TABLET | Freq: Once | ORAL | Status: AC
  Administered 2014-12-02: 40 meq via ORAL
  Filled 2014-12-02: qty 2

## 2014-12-02 MED ORDER — ENSURE ENLIVE PO LIQD
237.0000 mL | ORAL | Status: DC
  Administered 2014-12-02: 237 mL via ORAL

## 2014-12-02 MED ORDER — PHENAZOPYRIDINE HCL 100 MG PO TABS
100.0000 mg | ORAL_TABLET | Freq: Three times a day (TID) | ORAL | Status: DC
  Administered 2014-12-02 – 2014-12-03 (×4): 100 mg via ORAL
  Filled 2014-12-02 (×6): qty 1

## 2014-12-02 MED ORDER — ACETAMINOPHEN 650 MG RE SUPP: 650.0000 mg | Freq: Four times a day (QID) | RECTAL | Status: DC | PRN

## 2014-12-02 MED ORDER — HYDROCODONE-ACETAMINOPHEN 5-325 MG PO TABS
1.0000 | ORAL_TABLET | ORAL | Status: DC | PRN
  Administered 2014-12-02 – 2014-12-03 (×7): 1 via ORAL
  Filled 2014-12-02 (×7): qty 1

## 2014-12-02 MED ORDER — HEPARIN SODIUM (PORCINE) 5000 UNIT/ML IJ SOLN
5000.0000 [IU] | Freq: Three times a day (TID) | INTRAMUSCULAR | Status: DC
  Administered 2014-12-02 (×3): 5000 [IU] via SUBCUTANEOUS
  Filled 2014-12-02 (×4): qty 1

## 2014-12-02 NOTE — Progress Notes (Signed)
  Pt admitted to the unit. Pt is stable, alert and oriented per baseline. Oriented to room, staff, and call bell. Educated to call for any assistance. Bed in lowest position, call bell within reach- will continue to monitor. 

## 2014-12-02 NOTE — Progress Notes (Signed)
Initial Nutrition Assessment  DOCUMENTATION CODES:   Not applicable  INTERVENTION:   -Ensure Enlive po daily, each supplement provides 350 kcal and 20 grams of protein  NUTRITION DIAGNOSIS:   Inadequate oral intake related to poor appetite as evidenced by per patient/family report.  GOAL:   Patient will meet greater than or equal to 90% of their needs  MONITOR:   PO intake, Supplement acceptance, Labs, Weight trends, Skin, I & O's  REASON FOR ASSESSMENT:   Malnutrition Screening Tool    ASSESSMENT:   Jimmy [name] is a 49 y.o. male presenting with lower abdominal pain x 2 weeks. PMH is significant for recent Herpes zoster ophthalmicus in June, history of spinal stenosis and low back pain s/p surgery  Pt reports a general decline in health over the past 2-3 months, due to developing a stomach virus and shingles ("it's been one thing after the other"). Pt reports he consumes 3 meals per day PTA, but was eating smaller quantities due to lack of appetite. He reports his appetite is returning, and consumed 75% of his breakfast.   Pt reports UBW is 225#, which he last weighed over a year ago. He reports he has been gaining weight due to decreased mobility from back pain. He endorses a 55# (22%) wt loss x 1-2 months, per wt records.  Nutrition-Focused physical exam completed. Findings are mild fat depletion (upper arm only), mild muscle depletion (calf only), and no edema. Pt reports mild depletion is likely due to decrease in mobility. Prior to having back troubles, pt was very active and reports he has observed depletion in muscle mass.   Discussed importance of good PO intake to support healing. Pt would like to try nutritional supplement to promote nutritional adequacy.    Diet Order:  Diet regular Room service appropriate?: Yes; Fluid consistency:: Thin  Skin:  Reviewed, no issues  Last BM:  11/29/14  Height:   Ht Readings from Last 1 Encounters:  12/01/14   (1.803 m)    Weight:   Wt Readings from Last 1 Encounters:  12/02/14 195 lb 12.8 oz (88.814 kg)    Ideal Body Weight:  78.2 kg  BMI:  Body mass index is 27.32 kg/(m^2).  Estimated Nutritional Needs:   Kcal:  2000-2200  Protein:  100-110 grams  Fluid:  2.0-2.2 L  EDUCATION NEEDS:   Education needs addressed  Murlin Schrieber A. Mayford Knife, RD, LDN, CDE Pager: [phone] After hours Pager: [phone]

## 2014-12-02 NOTE — ED Notes (Signed)
Patient transported to CT 

## 2014-12-02 NOTE — H&P (Signed)
Family Medicine Teaching [facility] Admission History and Physical Service Pager: [phone]  Patient name: Jimmy [name] Medical record number: [id number] Date of birth: August 10, 1965 Age: 49 y.o. Gender: male  Primary Care Provider: Lucilla Edin, MD Consultants: None Code Status: Full  Chief Complaint: abdominal pain  Assessment and Plan: Jimmy [name] is a 49 y.o. male presenting with lower abdominal pain x 2 weeks. PMH is significant for recent Herpes zoster ophthalmicus in June, history of spinal stenosis and low back pain s/p surgery  # Lower abdominal pain and urinary retention: recurrent ED visits for (primarily) lower abdominal pain, right > left. CT abd/pelvis 3 days ago showed distended bladder, slightly distended gallbladder; pain improved with foley placement and bladder decompression (also had MRI lumbar spine to eval for spinal stenosis but this was negative). Pain recurred and did not improve with foley placement today and repeat CT showed no other abnormalities.  Lab workup is largely unremarkable, no leukocytosis, UA without any evidence of blood/infection. Abdominal exam is notable for tenderness to both right and left groin with light touch, and significant prostate tenderness on DRE. DDx includes appendicitis, prostatitis, IBS, ?recurrent shingles of sacral roots; leading diagnosis being prostatitis with urinary retention, pain on palpation with DRE, although clinical picture not fully consistent with normal UA. With normal CT x 2 would largely ruleout most other concerning etiology, or at least make it less likely like appendicitis.  - repeat UA >> now with small hemoglobin, large leukocytes - urine culture - start empiric bactrim for prostatitis coverage - d/c foley in AM with void trial, consider adding back flomax  # Hypokalemia: 3.4 - replete kdur  FEN/GI: reg diet saline lock Prophylaxis: heparin  Disposition: admit  History of Present Illness:  Jimmy [name] is a 49 y.o. male presenting with 2 week history of lower abdominal pain, worsened over past 3 days. Patient states that he had been in relatively good health up until late June when he developed shingles of his left forehead/eye. Shingles has pretty much resolved at this point with just some leftover scar. About 2 weeks ago he started having fatigue, nausea, some vomiting. He also had some complaints of intermittent constipation as well as lower abdominal pain on both the right and left side. 2 days ago the abdominal pain got acutely worse, including turning from a deep dull ache to sharp pains that last 3-5 seconds primarily on the right. He went to the ED and was found to have urinary retention relieved by foley catheter (which also made his pain go away), was discharged with flomax of which he only took 1 tablet. When he woke up this morning the abdominal pain had come back and was bad enough that he came back to the ED where he again had a foley catheter placed but without any relief of the pain. He denies fevers but does say he has had a lot of chills for several days. He currently does complain of some chest pain, shortness of breath, nausea but no recent vomiting, no pain with defecation, no pain with urination, no scrotal pain, no penile discharge, no numbness around the groin area, no new rashes.  Review Of Systems: Per HPI with the following additions: none Otherwise 12 point review of systems was performed and was unremarkable.  Patient Active Problem List   Diagnosis Date Noted  . Abdominal pain 12/02/2014   Past Medical History: Past Medical History  Diagnosis Date  . Back pain    Past Surgical  History: Past Surgical History  Procedure Laterality Date  . Back surgery     Social History: History  Substance Use Topics  . Smoking status: Former Smoker    Quit date: 05/02/2007  . Smokeless tobacco: Not on file  . Alcohol Use: No   Additional social history:  none Please also refer to relevant sections of EMR.  Family History: No family history on file. Allergies and Medications: Allergies  Allergen Reactions  . Antihistamines, Chlorpheniramine-Type     Hallucinations, messes with patient head   No current facility-administered medications on file prior to encounter.   Current Outpatient Prescriptions on File Prior to Encounter  Medication Sig Dispense Refill  . ibuprofen (ADVIL,MOTRIN) 200 MG tablet Take 600 mg by mouth every 6 (six) hours as needed for moderate pain.    Marland Kitchen ondansetron (ZOFRAN ODT) 4 MG disintegrating tablet Take 1 tablet (4 mg total) by mouth every 8 (eight) hours as needed for nausea or vomiting. 20 tablet 0  . tamsulosin (FLOMAX) 0.4 MG CAPS capsule Take 1 capsule (0.4 mg total) by mouth daily. 10 capsule 0  . acyclovir (ZOVIRAX) 400 MG tablet Take 2 tablets (800 mg total) by mouth 5 (five) times daily. (Patient not taking: Reported on 11/30/2014) 50 tablet 0  . HYDROcodone-acetaminophen (NORCO/VICODIN) 5-325 MG per tablet Take 1-2 tablets by mouth every 4 (four) hours as needed. (Patient not taking: Reported on 11/30/2014) 20 tablet 0  . naproxen (NAPROSYN) 250 MG tablet Take 1 tablet (250 mg total) by mouth 2 (two) times daily with a meal. (Patient not taking: Reported on 12/01/2014) 30 tablet 0  . ondansetron (ZOFRAN ODT) 4 MG disintegrating tablet Take 1 tablet (4 mg total) by mouth every 8 (eight) hours as needed for nausea or vomiting. (Patient not taking: Reported on 11/30/2014) 10 tablet 0    Objective: BP 139/82 mmHg  Pulse 83  Temp(Src) 98 F (36.7 C) (Oral)  Resp 18  Ht 5\' 11"  (1.803 m)  Wt 250 lb (113.399 kg)  BMI 34.88 kg/m2  SpO2 100% Exam: General: NAD Eyes: PERRL, EOMI, sclera anicteric ENTM: no oropharyngeal lesions, MMM. Neck: normal, supple Cardiovascular: RRR, nl s1s2 no mrg 2+ radial and PT pulses bl Respiratory: CTAB nl effort Abdomen: soft, obese, small ?umbilical hernia, TTP to even light touch  in both right and left groin area, diffuse tenderness throughout but no rebound or guarding. Bowel sounds are present GU: prostate moderately firm, exquisitely tender to palpation MSK: normal bulk/tone. No deformities. Skin: resolving hyperpigmented lesions over left forehead, left eyelid. well healed old midline low back surgical scar Neuro: alert and oriented x 3, no focal deficits Psych: normal thought content and speech.  Labs and Imaging: CBC BMET   Recent Labs Lab 12/01/14 2020  WBC 5.7  HGB 12.5*  HCT 34.6*  PLT 239    Recent Labs Lab 12/01/14 2020  NA 129*  K 3.4*  CL 97*  CO2 20*  BUN 6  CREATININE 0.84  GLUCOSE 114*  CALCIUM 9.5     Ct Abdomen Pelvis Wo Contrast  12/02/2014   CLINICAL DATA:  Recurrent abdominal pain.  Subsequent encounter.  EXAM: CT ABDOMEN AND PELVIS WITHOUT CONTRAST  TECHNIQUE: Multidetector CT imaging of the abdomen and pelvis was performed following the standard protocol without IV contrast.  COMPARISON:  11/30/2014  FINDINGS: There are unremarkable unenhanced appearances of the liver, spleen, pancreas, adrenals and kidneys. There is no hydronephrosis. Ureters are unremarkable. Urinary bladder is collapsed around a Foley catheter, grossly  unremarkable. There are normal appearances of the stomach, small bowel and colon. The appendix is normal. The abdominal aorta is normal in caliber. There is mild atherosclerotic calcification. There is no adenopathy in the abdomen or pelvis.  No acute inflammatory changes are evident in the abdomen or pelvis. There is no bowel obstruction. There is no extraluminal air.  There is a fat containing umbilical hernia.  There is no significant abnormality in the lower chest.  There is no significant skeletal lesion. Mild degenerative disc changes are present at L4-5.  IMPRESSION: No acute findings are evident in the abdomen or pelvis.  Fat containing umbilical hernia   Electronically Signed   By: Ellery Plunk M.D.   On:  12/02/2014 00:58   Mr Lumbar Spine W Wo Contrast  11/30/2014   CLINICAL DATA:  Initial evaluation for acute back pain.  EXAM: MRI LUMBAR SPINE WITHOUT AND WITH CONTRAST  TECHNIQUE: Multiplanar and multiecho pulse sequences of the lumbar spine were obtained without and with intravenous contrast.  CONTRAST:  20mL MULTIHANCE GADOBENATE DIMEGLUMINE 529 MG/ML IV SOLN  COMPARISON:  Prior MRI from 05/20/2009  FINDINGS: For the purposes of this dictation, the lowest well-formed intervertebral disc spaces presumed to be the L5-S1 level, and there presumed to be 5 lumbar type vertebral bodies.  Transitional lumbosacral anatomy with partial sacralization of the L5 vertebral body. In keeping with prior studies, the transitional segment is labeled L5. Alignment is stable with preservation of the normal lumbar lordosis. Vertebral body heights preserved. No fracture or listhesis. No marrow edema. Diffusely decreased marrow signal intensity is stable from previous studies.  Conus medullaris terminates normally at the T12 level. Signal intensity within the visualized cord is normal. Nerve roots of the cauda equina within normal limits.  Postoperative changes from prior posterior decompression again seen at L4-5.  Diffuse congenital shortening of the pedicles noted.  Paraspinous soft tissues within normal limits. Urinary bladder distension noted.  No abnormal enhancement.  T11-12:  Negative.  T12-L1:  Negative.  L1-2: Tiny central disc protrusion with associated annular fissure. No canal or foraminal stenosis.  L2-3: Mild diffuse annular disc bulge. Facet and ligamentous hypertrophy. No significant canal or foraminal stenosis.  L3-4: Mild diffuse disc bulge. Superimposed facet and ligamentous hypertrophy. No significant canal or foraminal stenosis.  L4-5: Postoperative changes from prior decompressive wide laminectomy bilaterally. Thecal sac is widely patent without residual stenosis. Probable small amount of enhancing granulation  tissue within the left ventral epidural space. Degenerative disc bulging with disc desiccation and associated endplate changes present. No neural impingement. Mild left foraminal narrowing related to disc bulge.  L5-S1: Transitional disc space without disc bulge or disc protrusion. Mild bilateral facet hypertrophy. No stenosis.  IMPRESSION: 1. Overall stable appearance of the lumbar spine with sequelae of prior decompressive laminectomy at L4-5. No recurrent stenosis at this level. 2. Congenital spinal stenosis with superimposed mild multilevel degenerative changes as above, overall relatively similar to prior MRI from 2011. No severe canal stenosis or evidence of cord compression. 3. Distended urinary bladder.   Electronically Signed   By: Rise Mu M.D.   On: 11/30/2014 03:53   Ct Abdomen Pelvis W Contrast  11/30/2014   CLINICAL DATA:  Generalized weakness for 4 days. Nausea and vomiting. Abdominal pain.  EXAM: CT ABDOMEN AND PELVIS WITH CONTRAST  TECHNIQUE: Multidetector CT imaging of the abdomen and pelvis was performed using the standard protocol following bolus administration of intravenous contrast.  CONTRAST:  OMNIPAQUE IOHEXOL 300 MG/ML  SOLN  COMPARISON:  None.  FINDINGS: Lower chest:  The included lung bases are clear.  Liver: No focal lesion.  Hepatobiliary: Gallbladder mildly distended without pericholecystic inflammatory change. No common bowel duct dilatation. No intrahepatic biliary dilatation.  Pancreas: Normal.  Spleen: Normal.  Adrenal glands: No nodule.  Kidneys: No hydronephrosis. Mildly prominent distal ureters, likely related to distended bladder. No localizing or focal renal abnormality. Minimally heterogeneous appearance of the cortex of the upper left kidney without discrete lesion.  Stomach/Bowel: Stomach physiologically distended. There are no dilated or thickened small bowel loops. Liquid stool throughout the colon without colonic wall thickening. The appendix is  normal.  Vascular/Lymphatic: No retroperitoneal adenopathy. Abdominal aorta is normal in caliber. Mild moderate atherosclerosis of the distal abdominal aorta and iliac branches.  Reproductive: Prostate gland is normal in size.  Bladder: Distended.  Other: No free air, free fluid, or intra-abdominal fluid collection. Fat containing umbilical hernia.  Musculoskeletal: There are no acute or suspicious osseous abnormalities. Transitional lumbosacral anatomy is seen. Postsurgical and degenerative change at L4-L5.  IMPRESSION: 1. Minimal heterogeneity of the upper pole of the left renal cortex, can be seen in the setting of pyelonephritis. Small cortical lesions too small to characterize could have a similar appearance. Correlation with urinalysis recommended. The urinary bladder is distended. 2. Liquid stool in the colon without colonic wall thickening. 3. Gallbladder appears mildly distended but without CT findings of surrounding inflammation. 4. Fat containing umbilical hernia.   Electronically Signed   By: Rubye Oaks M.D.   On: 11/30/2014 00:51     Nani Ravens, MD 12/02/2014, 12:54 AM PGY-3, [facility] FPTS Intern pager: [phone], text pages welcome

## 2014-12-02 NOTE — Evaluation (Signed)
Physical Therapy Evaluation Patient Details Name: Jimmy [name] MRN: [id number] DOB: May 01, 1966 Today's Date: 12/02/2014   History of Present Illness  Jimmy [name] is a 49 y.o. male presenting with 2 week history of lower abdominal pain, worsened over past 3 days. Patient states that he had been in relatively good health up until late June when he developed shingles of his left forehead/eye. Shingles has pretty much resolved at this point with just some leftover scar. About 2 weeks ago he started having fatigue, nausea, some vomiting. He also had some complaints of intermittent constipation as well as lower abdominal pain on both the right and left side. 2 days ago the abdominal pain got acutely worse, including turning from a deep dull ache to sharp pains that last 3-5 seconds primarily on the right. He went to the ED and was found to have urinary retention relieved by foley catheter (which also made his pain go away), was discharged with flomax of which he only took 1 tablet. When he woke up this morning the abdominal pain had come back and was bad enough that he came back to the ED where he again had a foley catheter placed but without any relief of the pain.   Clinical Impression  Pt presents with above.  Note history of chronic LBP, which pt sees MD for as this is main limiting factor.  Pt still with increased abdominal pain, but states that he already got pain medication prior to session.  Pt mod I for all activity and will not require any follow up.  PT to sign off at this time.     Follow Up Recommendations No PT follow up    Equipment Recommendations  None recommended by PT    Recommendations for Other Services       Precautions / Restrictions Precautions Precautions: None Precaution Comments: chronic low back pain Restrictions Weight Bearing Restrictions: No      Mobility  Bed Mobility Overal bed mobility: Independent                Transfers Overall transfer  level: Independent                  Ambulation/Gait Ambulation/Gait assistance: Modified independent (Device/Increase time) Ambulation Distance (Feet): 150 Feet Assistive device: None Gait Pattern/deviations: Antalgic     General Gait Details: Pt with slight limp noted during gait due to increased back pain and tends to hold abdomen for bracing due to pain, no overt LOB during gait.   Stairs Stairs: Yes Stairs assistance: Modified independent (Device/Increase time) Stair Management: One rail Right Number of Stairs: 6    Wheelchair Mobility    Modified Rankin (Stroke Patients Only)       Balance Overall balance assessment: Modified Independent                                           Pertinent Vitals/Pain Pain Assessment: 0-10 Pain Score: 6  Pain Location: abdominal pain and back pain Pain Descriptors / Indicators: Aching Pain Intervention(s): Monitored during session;Repositioned    Home Living Family/patient expects to be discharged to:: Private residence Living Arrangements: Spouse/significant other;Children Available Help at Discharge: Family;Available 24 hours/day Type of Home: House Home Access: Stairs to enter Entrance Stairs-Rails: Doctor, general practice of Steps: 2 Home Layout: One level        Prior Function Level of Independence: Independent  Hand Dominance        Extremity/Trunk Assessment   Upper Extremity Assessment: Overall WFL for tasks assessed           Lower Extremity Assessment: Overall WFL for tasks assessed      Cervical / Trunk Assessment: Normal  Communication   Communication: No difficulties  Cognition Arousal/Alertness: Awake/alert Behavior During Therapy: WFL for tasks assessed/performed Overall Cognitive Status: Within Functional Limits for tasks assessed                      General Comments      Exercises        Assessment/Plan    PT  Assessment Patent does not need any further PT services  PT Diagnosis Acute pain   PT Problem List    PT Treatment Interventions     PT Goals (Current goals can be found in the Care Plan section) Acute Rehab PT Goals Patient Stated Goal: to return home tomorrow PT Goal Formulation: All assessment and education complete, DC therapy    Frequency     Barriers to discharge        Co-evaluation               End of Session   Activity Tolerance: Patient tolerated treatment well Patient left: in bed;with call bell/phone within reach           Time: 1610-9604 PT Time Calculation (min) (ACUTE ONLY): 10 min   Charges:   PT Evaluation $Initial PT Evaluation Tier I: 1 Procedure     PT G C[facility] Deck 12/02/2014, 4:22 PM

## 2014-12-02 NOTE — Care Management Note (Signed)
Case Management Note  Patient Details  Name: Jimmy [name] MRN: [id number] Date of Birth: [birth year]  Subjective/Objective:                 49 year old gentleman who lives at home with his wife. Denies any barriers to obtaining medication or follow up care. Anticipate discharge to home.   Action/Plan:  Discharge to home; self care. No CM needs identified at this time, will continue to follow.  Expected Discharge Date:  11/30/14               Expected Discharge Plan:  Home/Self Care  In-House Referral:     Discharge planning Services  CM Consult  Post Acute Care Choice:    Choice offered to:     DME Arranged:    DME Agency:     HH Arranged:    HH Agency:     Status of Service:  In process, will continue to follow  Medicare Important Message Given:    Date Medicare IM Given:    Medicare IM give by:    Date Additional Medicare IM Given:    Additional Medicare Important Message give by:     If discussed at Long Length of Stay Meetings, dates discussed:    Additional Comments:  Lawerance Sabal, RN 12/02/2014, 2:19 PM

## 2014-12-03 DIAGNOSIS — N419 Inflammatory disease of prostate, unspecified: Secondary | ICD-10-CM | POA: Diagnosis present

## 2014-12-03 DIAGNOSIS — N410 Acute prostatitis: Secondary | ICD-10-CM | POA: Diagnosis not present

## 2014-12-03 DIAGNOSIS — M545 Low back pain: Secondary | ICD-10-CM | POA: Diagnosis not present

## 2014-12-03 DIAGNOSIS — R1030 Lower abdominal pain, unspecified: Secondary | ICD-10-CM | POA: Diagnosis not present

## 2014-12-03 DIAGNOSIS — R339 Retention of urine, unspecified: Secondary | ICD-10-CM | POA: Diagnosis not present

## 2014-12-03 LAB — URINE CULTURE: Culture: NO GROWTH

## 2014-12-03 LAB — GC/CHLAMYDIA PROBE AMP (~~LOC~~) NOT AT ARMC
Chlamydia: NEGATIVE
Neisseria Gonorrhea: NEGATIVE

## 2014-12-03 MED ORDER — SULFAMETHOXAZOLE-TRIMETHOPRIM 800-160 MG PO TABS: 1.0000 | ORAL_TABLET | Freq: Two times a day (BID) | ORAL | Status: DC

## 2014-12-03 MED ORDER — PHENAZOPYRIDINE HCL 100 MG PO TABS: 100.0000 mg | ORAL_TABLET | Freq: Three times a day (TID) | ORAL | Status: DC

## 2014-12-03 NOTE — Discharge Instructions (Signed)
You were admitted to [facility] for pain and urinary retention. You were found to have an infection of the prostate. You were started on an antibiotic called bactrim and will need to take this for a total of 6 weeks. You should also continue taking Pyridium for the next 3 days. Please restart your Flomax. Please follow up with Urology for an appointment and with primary care physician for hospital follow up.    Please contact your primary care physician or return if you have a return of severe abdominal pain, urinary retention, fevers greater than 101.5   Prostatitis The prostate gland is about the size and shape of a walnut. It is located just below your bladder. It produces one of the components of semen, which is made up of sperm and the fluids that help nourish and transport it out from the testicles. Prostatitis is inflammation of the prostate gland.  There are four types of prostatitis:  Acute bacterial prostatitis. This is the least common type of prostatitis. It starts quickly and usually is associated with a bladder infection, high fever, and shaking chills. It can occur at any age.  Chronic bacterial prostatitis. This is a persistent bacterial infection in the prostate. It usually develops from repeated acute bacterial prostatitis or acute bacterial prostatitis that was not properly treated. It can occur in men of any age but is most common in middle-aged men whose prostate has begun to enlarge. The symptoms are not as severe as those in acute bacterial prostatitis. Discomfort in the part of your body that is in front of your rectum and below your scrotum (perineum), lower abdomen, or in the head of your penis (glans) may represent your primary discomfort.  Chronic prostatitis (nonbacterial). This is the most common type of prostatitis. It is inflammation of the prostate gland that is not caused by a bacterial infection. The cause is unknown and may be associated with a viral infection or  autoimmune disorder.  Prostatodynia (pelvic floor disorder). This is associated with increased muscular tone in the pelvis surrounding the prostate. CAUSES The causes of bacterial prostatitis are bacterial infection. The causes of the other types of prostatitis are unknown.  SYMPTOMS  Symptoms can vary depending upon the type of prostatitis that exists. There can also be overlap in symptoms. Possible symptoms for each type of prostatitis are listed below. Acute Bacterial Prostatitis  Painful urination.  Fever or chills.  Muscle or joint pains.  Low back pain.  Low abdominal pain.  Inability to empty bladder completely. Chronic Bacterial Prostatitis, Chronic Nonbacterial Prostatitis, and Prostatodynia  Sudden urge to urinate.  Frequent urination.  Difficulty starting urine stream.  Weak urine stream.  Discharge from the urethra.  Dribbling after urination.  Rectal pain.  Pain in the testicles, penis, or tip of the penis.  Pain in the perineum.  Problems with sexual function.  Painful ejaculation.  Bloody semen. DIAGNOSIS  In order to diagnose prostatitis, your health care provider will ask about your symptoms. One or more urine samples will be taken and tested (urinalysis). If the urinalysis result is negative for bacteria, your health care provider may use a finger to feel your prostate (digital rectal exam). This exam helps your health care provider determine if your prostate is swollen and tender. It will also produce a specimen of semen that can be analyzed. TREATMENT  Treatment for prostatitis depends on the cause. If a bacterial infection is the cause, it can be treated with antibiotic medicine. In cases of  chronic bacterial prostatitis, the use of antibiotics for up to 1 month or 6 weeks may be necessary. Your health care provider may instruct you to take sitz baths to help relieve pain. A sitz bath is a bath of hot water in which your hips and buttocks are under  water. This relaxes the pelvic floor muscles and often helps to relieve the pressure on your prostate. HOME CARE INSTRUCTIONS   Take all medicines as directed by your health care provider.  Take sitz baths as directed by your health care provider. SEEK MEDICAL CARE IF:   Your symptoms get worse, not better.  You have a fever. SEEK IMMEDIATE MEDICAL CARE IF:   You have chills.  You feel nauseous or vomit.  You feel lightheaded or faint.  You are unable to urinate.  You have blood or blood clots in your urine. MAKE SURE YOU:  Understand these instructions.  Will watch your condition.  Will get help right away if you are not doing well or get worse. Document Released: 04/14/2000 Document Revised: 04/22/2013 Document Reviewed: 11/04/2012 [facility] Patient Information 2015 [place], [place]. This information is not intended to replace advice given to you by your health care provider. Make sure you discuss any questions you have with your health care provider.

## 2014-12-03 NOTE — Progress Notes (Signed)
Pt verbalizes understanding of all discharge instructions and discharged home in stable condition. 

## 2014-12-03 NOTE — Progress Notes (Signed)
Pt urinated 250 ml straw colored urine at 0900 (foley removed 0600 per night shift RN). Post void residual 34 ml.

## 2014-12-03 NOTE — Discharge Summary (Signed)
Family Medicine Teaching [facility] Discharge Summary  Patient name: Jimmy [name] Medical record number: [id number] Date of birth: 1965-10-12 Age: 49 y.o. Gender: male Date of Admission: 12/01/2014  Date of Discharge: 12/03/2014  Admitting Physician: Moses Manners, MD  Primary Care Provider: Lucilla Edin, MD Consultants: None   Indication for Hospitalization: Urinary Retention and Abdominal Pain   Discharge Diagnoses/Problem List:  Prostatitis    Disposition: Home   Discharge Condition: Stable   Discharge Exam: General: NAD Eyes: PERRL, EOMI, sclera anicteric ENTM: no oropharyngeal lesions, MMM. Neck: normal, supple Cardiovascular: RRR, nl s1s2 no mrg 2+ radial and PT pulses bl Respiratory: CTAB nl effort Abdomen: soft, BS+  still slightly tender to palpation in RLQ and LLQ, however much improved from initial admission exam  diffuse tenderness throughout but no rebound or guarding. Bowel sounds are present GU: prostate moderately firm, exquisitely tender to palpation MSK: normal bulk/tone. No deformities. Skin: resolving hyperpigmented lesions over left forehead, left eyelid. well healed old midline low back surgical scar Neuro: alert and oriented x 3, no focal deficits Psych: normal thought content and speech.  Brief Hospital Course:  Jimmy [name] is a 49 y.o. male presenting with lower abdominal pain x 2 weeks, urinary retention, and chronic back pain. PMH is significant for recent Herpes zoster ophthalmicus in June, history of spinal stenosis and low back pain s/p surgery. Patient had 2 x abdominal/pelvis CT on 8/1 and 8/3  which ruled out an acute abdomen. MRI lumbar (8/1) was also completed that was negative for spinal stenosis. Bladder was scanned, showing retention and then a was foley placed.  A DRE was significant for prostate tenderness. UA pre digital rectal exam was unremarkable, however repeat UA after physical exam was positive for small hemoglobin and  large leukocytes. Urine culture was unremarkable. G/C DNA probe was negative. Patient was started on empiric bactrim for prostatitis. Patient's pain improved. Foley was removed and patient was able to void on his own. His post-void residual was insignificant. Patient was discharged on 6 weeks of bactrim and Flomax with instruction to follow up with urologist.   Issues for Follow Up: 1. Home with 6 weeks of Bactrim- f/u on pain, urinary incontinence 2. Patient will need to f/u with urologist.   Significant Procedures:   Significant Labs and Imaging:   Recent Labs Lab 11/29/14 2230 12/01/14 2020 12/02/14 0558  WBC 4.9 5.7 5.2  HGB 12.9* 12.5* 12.1*  HCT 35.5* 34.6* 34.0*  PLT 218 239 202    Recent Labs Lab 11/29/14 2230 11/29/14 2238 11/30/14 0514 12/01/14 2020 12/02/14 0558  NA 124*  --  127* 129* 134*  K 3.4*  --  3.9 3.4* 3.7  CL 96*  --  99* 97* 104  CO2 19*  --  20* 20* 23  GLUCOSE 114*  --  115* 114* 120*  BUN 7  --  5* 6 <5*  CREATININE 0.86  --  0.81 0.84 0.72  CALCIUM 8.6*  --  9.1 9.5 9.1  ALKPHOS  --  48  --  56  --   AST  --  15  --  19  --   ALT  --  12*  --  14*  --   ALBUMIN  --  3.2*  --  3.9  --      Results/Tests Pending at Time of Discharge: G/C Probe   Discharge Medications:    Medication List    STOP taking these medications  naproxen 250 MG tablet  Commonly known as:  NAPROSYN      TAKE these medications        acyclovir 400 MG tablet  Commonly known as:  ZOVIRAX  Take 2 tablets (800 mg total) by mouth 5 (five) times daily.     HYDROcodone-acetaminophen 5-325 MG per tablet  Commonly known as:  NORCO/VICODIN  Take 1-2 tablets by mouth every 4 (four) hours as needed.     ibuprofen 200 MG tablet  Commonly known as:  ADVIL,MOTRIN  Take 600 mg by mouth every 6 (six) hours as needed for moderate pain.     ondansetron 4 MG disintegrating tablet  Commonly known as:  ZOFRAN ODT  Take 1 tablet (4 mg total) by mouth every 8 (eight)  hours as needed for nausea or vomiting.     phenazopyridine 100 MG tablet  Commonly known as:  PYRIDIUM  Take 1 tablet (100 mg total) by mouth 3 (three) times daily with meals.     sulfamethoxazole-trimethoprim 800-160 MG per tablet  Commonly known as:  BACTRIM DS,SEPTRA DS  Take 1 tablet by mouth 2 (two) times daily.     tamsulosin 0.4 MG Caps capsule  Commonly known as:  FLOMAX  Take 1 capsule (0.4 mg total) by mouth daily.        Discharge Instructions: Please refer to Patient Instructions section of EMR for full details.  Patient was counseled important signs and symptoms that should prompt return to medical care, changes in medications, dietary instructions, activity restrictions, and follow up appointments.   Follow-Up Appointments:     Follow-up Information    Follow up with DAUB, STEVE A, MD. Go in 3 days.   Specialty:  Family Medicine   Contact information:   [address] [place] [address] [phone]       Berton Bon, MD 12/03/2014, 3:51 PM PGY-1, [facility]

## 2014-12-03 NOTE — Progress Notes (Signed)
Reviewed Code 44 papers with patient. Questions answered by CM and patient signed. Original kept by patient and copy given to Tina Stutts Administrative Assistant for Case Management. Care Management form signed and placed in shadow chart. 

## 2014-12-04 ENCOUNTER — Ambulatory Visit (INDEPENDENT_AMBULATORY_CARE_PROVIDER_SITE_OTHER): Payer: Medicare Other

## 2014-12-04 ENCOUNTER — Ambulatory Visit (INDEPENDENT_AMBULATORY_CARE_PROVIDER_SITE_OTHER): Payer: Medicare Other | Admitting: Physician Assistant

## 2014-12-04 ENCOUNTER — Encounter: Payer: Self-pay | Admitting: Physician Assistant

## 2014-12-04 ENCOUNTER — Inpatient Hospital Stay (HOSPITAL_COMMUNITY)
Admission: AD | Admit: 2014-12-04 | Discharge: 2014-12-13 | Disposition: A | Discharge status: Home / Self Care | Payer: Medicare Other | Source: Ambulatory Visit | Attending: Family Medicine | Admitting: Family Medicine

## 2014-12-04 ENCOUNTER — Encounter (HOSPITAL_COMMUNITY): Payer: Self-pay | Admitting: *Deleted

## 2014-12-04 VITALS — BP 140/76 | HR 103 | Temp 97.7°F | Resp 16 | Ht 70.0 in | Wt 200.6 lb

## 2014-12-04 DIAGNOSIS — N410 Acute prostatitis: Secondary | ICD-10-CM | POA: Diagnosis present

## 2014-12-04 DIAGNOSIS — K5900 Constipation, unspecified: Secondary | ICD-10-CM | POA: Diagnosis not present

## 2014-12-04 DIAGNOSIS — R634 Abnormal weight loss: Secondary | ICD-10-CM

## 2014-12-04 DIAGNOSIS — D649 Anemia, unspecified: Secondary | ICD-10-CM | POA: Insufficient documentation

## 2014-12-04 DIAGNOSIS — R9431 Abnormal electrocardiogram [ECG] [EKG]: Secondary | ICD-10-CM | POA: Diagnosis not present

## 2014-12-04 DIAGNOSIS — I498 Other specified cardiac arrhythmias: Secondary | ICD-10-CM | POA: Diagnosis present

## 2014-12-04 DIAGNOSIS — R109 Unspecified abdominal pain: Secondary | ICD-10-CM | POA: Diagnosis present

## 2014-12-04 DIAGNOSIS — E2740 Unspecified adrenocortical insufficiency: Secondary | ICD-10-CM | POA: Diagnosis present

## 2014-12-04 DIAGNOSIS — M545 Low back pain, unspecified: Secondary | ICD-10-CM | POA: Diagnosis present

## 2014-12-04 DIAGNOSIS — G90A Postural orthostatic tachycardia syndrome (POTS): Secondary | ICD-10-CM | POA: Diagnosis present

## 2014-12-04 DIAGNOSIS — R000 Tachycardia, unspecified: Secondary | ICD-10-CM

## 2014-12-04 DIAGNOSIS — I951 Orthostatic hypotension: Secondary | ICD-10-CM | POA: Diagnosis present

## 2014-12-04 DIAGNOSIS — R1030 Lower abdominal pain, unspecified: Secondary | ICD-10-CM | POA: Insufficient documentation

## 2014-12-04 DIAGNOSIS — G8929 Other chronic pain: Secondary | ICD-10-CM | POA: Diagnosis present

## 2014-12-04 DIAGNOSIS — M25551 Pain in right hip: Secondary | ICD-10-CM

## 2014-12-04 DIAGNOSIS — R1031 Right lower quadrant pain: Secondary | ICD-10-CM

## 2014-12-04 LAB — LIPASE, BLOOD: Lipase: 29 U/L (ref 22–51)

## 2014-12-04 LAB — COMPREHENSIVE METABOLIC PANEL
ALT: 19 U/L (ref 17–63)
AST: 20 U/L (ref 15–41)
Albumin: 3.6 g/dL (ref 3.5–5.0)
Alkaline Phosphatase: 51 U/L (ref 38–126)
Anion gap: 11 (ref 5–15)
BUN: <5 mg/dL — ABNORMAL LOW (ref 6–20)
CO2: 21 mmol/L — ABNORMAL LOW (ref 22–32)
Calcium: 9.3 mg/dL (ref 8.9–10.3)
Chloride: 96 mmol/L — ABNORMAL LOW (ref 101–111)
Creatinine, Ser: 1.11 mg/dL (ref 0.61–1.24)
GFR calc Af Amer: >60 mL/min (ref >60)
GFR calc non Af Amer: >60 mL/min (ref >60)
Glucose, Bld: 139 mg/dL — ABNORMAL HIGH (ref 65–99)
Potassium: 3.5 mmol/L (ref 3.5–5.1)
Sodium: 128 mmol/L — ABNORMAL LOW (ref 135–145)
Total Bilirubin: 0.7 mg/dL (ref 0.3–1.2)
Total Protein: 6.5 g/dL (ref 6.5–8.1)

## 2014-12-04 LAB — URINALYSIS, ROUTINE W REFLEX MICROSCOPIC
Bilirubin Urine: NEGATIVE
Glucose, UA: NEGATIVE mg/dL
Hgb urine dipstick: NEGATIVE
Ketones, ur: NEGATIVE mg/dL
Leukocytes, UA: NEGATIVE
Nitrite: POSITIVE — AB
Protein, ur: NEGATIVE mg/dL
Specific Gravity, Urine: 1.011 (ref 1.005–1.030)
Urobilinogen, UA: 1 mg/dL (ref 0.0–1.0)
pH: 6.5 (ref 5.0–8.0)

## 2014-12-04 LAB — CBC WITH DIFFERENTIAL/PLATELET
Basophils Absolute: 0 10*3/uL (ref 0.0–0.1)
Basophils Relative: 1 % (ref 0–1)
Eosinophils Absolute: 0.2 10*3/uL (ref 0.0–0.7)
Eosinophils Relative: 4 % (ref 0–5)
HCT: 34 % — ABNORMAL LOW (ref 39.0–52.0)
Hemoglobin: 12 g/dL — ABNORMAL LOW (ref 13.0–17.0)
Lymphocytes Relative: 36 % (ref 12–46)
Lymphs Abs: 1.5 10*3/uL (ref 0.7–4.0)
MCH: 33.1 pg (ref 26.0–34.0)
MCHC: 35.3 g/dL (ref 30.0–36.0)
MCV: 93.7 fL (ref 78.0–100.0)
Monocytes Absolute: 0.3 10*3/uL (ref 0.1–1.0)
Monocytes Relative: 6 % (ref 3–12)
Neutro Abs: 2.3 10*3/uL (ref 1.7–7.7)
Neutrophils Relative %: 53 % (ref 43–77)
Platelets: 224 10*3/uL (ref 150–400)
RBC: 3.63 MIL/uL — ABNORMAL LOW (ref 4.22–5.81)
RDW: 12.4 % (ref 11.5–15.5)
WBC: 4.2 10*3/uL (ref 4.0–10.5)

## 2014-12-04 LAB — POCT CBC
Granulocyte percent: 54 %G (ref 37–80)
HCT, POC: 36.9 % — AB (ref 43.5–53.7)
Hemoglobin: 12.5 g/dL — AB (ref 14.1–18.1)
Lymph, poc: 2.1 (ref 0.6–3.4)
MCH, POC: 33 pg — AB (ref 27–31.2)
MCHC: 33.9 g/dL (ref 31.8–35.4)
MCV: 97.2 fL — AB (ref 80–97)
MID (cbc): 0.3 (ref 0–0.9)
MPV: 6.9 fL (ref 0–99.8)
POC Granulocyte: 2.9 (ref 2–6.9)
POC LYMPH PERCENT: 39.8 %L (ref 10–50)
POC MID %: 6.2 %M (ref 0–12)
Platelet Count, POC: 255 10*3/uL (ref 142–424)
RBC: 3.79 M/uL — AB (ref 4.69–6.13)
RDW, POC: 12.9 %
WBC: 5.4 10*3/uL (ref 4.6–10.2)

## 2014-12-04 LAB — POCT URINALYSIS DIPSTICK
Bilirubin, UA: NEGATIVE
Blood, UA: NEGATIVE
Glucose, UA: 100
Ketones, UA: NEGATIVE
Leukocytes, UA: NEGATIVE
Nitrite, UA: POSITIVE
Protein, UA: NEGATIVE
Spec Grav, UA: <=1.005
Urobilinogen, UA: 1
pH, UA: 6.5

## 2014-12-04 LAB — POCT UA - MICROSCOPIC ONLY
Casts, Ur, LPF, POC: NEGATIVE
Crystals, Ur, HPF, POC: NEGATIVE
Epithelial cells, urine per micros: NEGATIVE
RBC, urine, microscopic: NEGATIVE
WBC, Ur, HPF, POC: NEGATIVE
Yeast, UA: NEGATIVE

## 2014-12-04 LAB — URINE MICROSCOPIC-ADD ON

## 2014-12-04 LAB — TSH: TSH: 1.145 u[IU]/mL (ref 0.350–4.500)

## 2014-12-04 MED ORDER — SODIUM CHLORIDE 0.9 % IV BOLUS (SEPSIS)
1000.0000 mL | Freq: Once | INTRAVENOUS | Status: AC
  Administered 2014-12-04: 1000 mL via INTRAVENOUS

## 2014-12-04 MED ORDER — SODIUM CHLORIDE 0.9 % IV SOLN: 250.0000 mL | INTRAVENOUS | Status: DC | PRN

## 2014-12-04 MED ORDER — KETOROLAC TROMETHAMINE 60 MG/2ML IM SOLN
60.0000 mg | Freq: Once | INTRAMUSCULAR | Status: AC
  Administered 2014-12-04: 60 mg via INTRAMUSCULAR

## 2014-12-04 MED ORDER — POLYETHYLENE GLYCOL 3350 17 GM/SCOOP PO POWD: 17.0000 g | Freq: Two times a day (BID) | ORAL | Status: DC | PRN

## 2014-12-04 MED ORDER — DOCUSATE SODIUM 250 MG PO CAPS: 250.0000 mg | ORAL_CAPSULE | Freq: Every day | ORAL | Status: DC

## 2014-12-04 MED ORDER — MORPHINE SULFATE 2 MG/ML IJ SOLN
2.0000 mg | INTRAMUSCULAR | Status: DC | PRN
  Administered 2014-12-04 – 2014-12-05 (×2): 2 mg via INTRAVENOUS
  Filled 2014-12-04 (×2): qty 1

## 2014-12-04 MED ORDER — CYCLOBENZAPRINE HCL 10 MG PO TABS
10.0000 mg | ORAL_TABLET | Freq: Three times a day (TID) | ORAL | Status: DC | PRN
  Administered 2014-12-04 – 2014-12-07 (×6): 10 mg via ORAL
  Filled 2014-12-04 (×6): qty 1

## 2014-12-04 MED ORDER — HEPARIN SODIUM (PORCINE) 5000 UNIT/ML IJ SOLN
5000.0000 [IU] | Freq: Three times a day (TID) | INTRAMUSCULAR | Status: DC
  Administered 2014-12-04 – 2014-12-13 (×27): 5000 [IU] via SUBCUTANEOUS
  Filled 2014-12-04 (×29): qty 1

## 2014-12-04 MED ORDER — TRAMADOL HCL 50 MG PO TABS: 50.0000 mg | ORAL_TABLET | Freq: Three times a day (TID) | ORAL | Status: DC | PRN

## 2014-12-04 MED ORDER — TAMSULOSIN HCL 0.4 MG PO CAPS: 0.4000 mg | ORAL_CAPSULE | Freq: Every day | ORAL | Status: DC

## 2014-12-04 MED ORDER — ACETAMINOPHEN 650 MG RE SUPP: 650.0000 mg | Freq: Four times a day (QID) | RECTAL | Status: DC | PRN

## 2014-12-04 MED ORDER — HYDROCODONE-ACETAMINOPHEN 5-325 MG PO TABS
2.0000 | ORAL_TABLET | Freq: Four times a day (QID) | ORAL | Status: DC | PRN
  Administered 2014-12-04 – 2014-12-08 (×8): 2 via ORAL
  Filled 2014-12-04 (×9): qty 2

## 2014-12-04 MED ORDER — TRAMADOL HCL 50 MG PO TABS
50.0000 mg | ORAL_TABLET | Freq: Three times a day (TID) | ORAL | Status: DC | PRN
Start: 2014-12-04 — End: 2014-12-04
  Administered 2014-12-04: 50 mg via ORAL
  Filled 2014-12-04: qty 1

## 2014-12-04 MED ORDER — SULFAMETHOXAZOLE-TRIMETHOPRIM 800-160 MG PO TABS
1.0000 | ORAL_TABLET | Freq: Two times a day (BID) | ORAL | Status: DC
  Administered 2014-12-04 – 2014-12-13 (×18): 1 via ORAL
  Filled 2014-12-04 (×19): qty 1

## 2014-12-04 MED ORDER — SODIUM CHLORIDE 0.9 % IJ SOLN
3.0000 mL | Freq: Two times a day (BID) | INTRAMUSCULAR | Status: DC
  Administered 2014-12-05 – 2014-12-07 (×3): 3 mL via INTRAVENOUS
  Administered 2014-12-07: 10 mL via INTRAVENOUS
  Administered 2014-12-08 – 2014-12-13 (×12): 3 mL via INTRAVENOUS

## 2014-12-04 MED ORDER — PHENAZOPYRIDINE HCL 100 MG PO TABS
100.0000 mg | ORAL_TABLET | Freq: Three times a day (TID) | ORAL | Status: DC
  Administered 2014-12-04 – 2014-12-09 (×13): 100 mg via ORAL
  Filled 2014-12-04 (×19): qty 1

## 2014-12-04 MED ORDER — ENSURE ENLIVE PO LIQD
237.0000 mL | Freq: Two times a day (BID) | ORAL | Status: DC
  Administered 2014-12-04 – 2014-12-05 (×3): 237 mL via ORAL

## 2014-12-04 MED ORDER — SODIUM CHLORIDE 0.9 % IJ SOLN
3.0000 mL | INTRAMUSCULAR | Status: DC | PRN
  Administered 2014-12-07 (×2): 3 mL via INTRAVENOUS
  Filled 2014-12-04 (×2): qty 3

## 2014-12-04 MED ORDER — ACETAMINOPHEN 325 MG PO TABS
650.0000 mg | ORAL_TABLET | Freq: Four times a day (QID) | ORAL | Status: DC | PRN
  Administered 2014-12-09 – 2014-12-11 (×2): 650 mg via ORAL
  Filled 2014-12-04 (×2): qty 2

## 2014-12-04 MED ORDER — DOCUSATE SODIUM 50 MG PO CAPS
250.0000 mg | ORAL_CAPSULE | Freq: Every day | ORAL | Status: DC
  Administered 2014-12-04 – 2014-12-13 (×9): 250 mg via ORAL
  Filled 2014-12-04 (×10): qty 1

## 2014-12-04 MED ORDER — POLYETHYLENE GLYCOL 3350 17 G PO PACK
17.0000 g | PACK | Freq: Every day | ORAL | Status: DC
  Administered 2014-12-04 – 2014-12-07 (×4): 17 g via ORAL
  Filled 2014-12-04 (×4): qty 1

## 2014-12-04 NOTE — Progress Notes (Signed)
Subjective:    Patient ID: Jimmy [name], male    DOB: 05-01-1966, 49 y.o.   MRN: [id number]  HPI Patient presents for hospital f/u with dx of prostatitis and urinary retention. Sent home with rx for bactrim and pyridium. CT of abdomin/pelvis,  MRI of spine, and GC/chlamydia screen all negative. Voiding trial following removal of foley went well. Previous to most recent hospital stay was additional hospital visit for similar sx 2 days prior. States that lower back and abdominal pain are considerably worse and he has not had any medication for pain since leaving the hospital. States that pain in abdomen travels into groin area. Denies frequency or urgency, however, endorses decreased stream and trouble getting started. Additionally endorses constipation and pain with defection and fatigue and weakness. Denies fever or N/V. Wife states that has lost 20 lbs in the past 2 months additionally states that he had flu-like sx 1 month ago that with fever, chills, HA, N/V, and cough.   Shingles infection 1 1/2 month ago that was treated, but still has remnants on forehead. Was treated with acyclovir.   Med allergy to antihistamines.  Review of Systems  Constitutional: Positive for chills, appetite change and fatigue. Negative for fever.  Respiratory: Negative for shortness of breath.   Cardiovascular: Negative for chest pain, palpitations and leg swelling.  Gastrointestinal: Positive for abdominal pain and constipation. Negative for nausea, vomiting, diarrhea, blood in stool and abdominal distention.  Genitourinary: Positive for decreased urine volume and difficulty urinating. Negative for dysuria, urgency, frequency, hematuria, flank pain, discharge, penile swelling, scrotal swelling, penile pain and testicular pain.  Musculoskeletal: Positive for back pain.  Neurological: Positive for weakness. Negative for dizziness and headaches.      Objective:   Physical Exam  Constitutional: He is oriented to  person, place, and time. He appears well-developed and well-nourished. No distress.  Blood pressure 140/76, pulse 103, temperature 97.7 F (36.5 C), temperature source Oral, resp. rate 16, height 5\' 10"  (1.778 m), weight 200 lb 9.6 oz (90.992 kg), SpO2 99 %.  HENT:  Head: Normocephalic and atraumatic.  Right Ear: External ear normal.  Left Ear: External ear normal.  Mouth/Throat: Oropharynx is clear and moist.  Eyes: Conjunctivae are normal. Pupils are equal, round, and reactive to light. Right eye exhibits no discharge. Left eye exhibits no discharge. No scleral icterus.  Neck: Normal range of motion. Neck supple. No thyromegaly present.  Cardiovascular: Normal rate, regular rhythm and normal heart sounds.  Exam reveals no gallop and no friction rub.   No murmur heard. Pulmonary/Chest: Effort normal and breath sounds normal. No respiratory distress. He has no decreased breath sounds. He has no wheezes. He has no rhonchi. He has no rales.  Abdominal: Soft. Normal appearance and bowel sounds are normal. He exhibits no distension and no mass. There is tenderness in the periumbilical area and suprapubic area. There is no rigidity, no rebound, no guarding, no CVA tenderness, no tenderness at McBurney's point and negative Murphy's sign. No hernia.  Musculoskeletal: He exhibits no edema.  Lymphadenopathy:    He has no cervical adenopathy.  Neurological: He is alert and oriented to person, place, and time.  Skin: Skin is warm and dry. No rash noted. He is not diaphoretic. No erythema. No pallor.  Hyperpigmented area around eye and forehead from previous shingles infection  Psychiatric: He has a normal mood and affect. His behavior is normal. Judgment and thought content normal.    Results for orders placed or performed  in visit on 12/04/14  POCT CBC  Result Value Ref Range   WBC 5.4 4.6 - 10.2 K/uL   Lymph, poc 2.1 0.6 - 3.4   POC LYMPH PERCENT 39.8 10 - 50 %L   MID (cbc) 0.3 0 - 0.9   POC MID  % 6.2 0 - 12 %M   POC Granulocyte 2.9 2 - 6.9   Granulocyte percent 54.0 37 - 80 %G   RBC 3.79 (A) 4.69 - 6.13 M/uL   Hemoglobin 12.5 (A) 14.1 - 18.1 g/dL   HCT, POC 16.1 (A) 09.6 - 53.7 %   MCV 97.2 (A) 80 - 97 fL   MCH, POC 33.0 (A) 27 - 31.2 pg   MCHC 33.9 31.8 - 35.4 g/dL   RDW, POC 04.5 %   Platelet Count, POC 255 142 - 424 K/uL   MPV 6.9 0 - 99.8 fL  POCT UA - Microscopic Only  Result Value Ref Range   WBC, Ur, HPF, POC neg    RBC, urine, microscopic neg    Bacteria, U Microscopic trace    Mucus, UA trace    Epithelial cells, urine per micros neg    Crystals, Ur, HPF, POC neg    Casts, Ur, LPF, POC neg    Yeast, UA neg   POCT urinalysis dipstick  Result Value Ref Range   Color, UA orange    Clarity, UA clear    Glucose, UA 100    Bilirubin, UA neg    Ketones, UA neg    Spec Grav, UA <=1.005    Blood, UA neg    pH, UA 6.5    Protein, UA neg    Urobilinogen, UA 1.0    Nitrite, UA pos    Leukocytes, UA Negative Negative   [facility] reading (PRIMARY) by  Dr. Cleta Alberts. No acute cardiopulmonary dz.     Assessment & Plan:  1. Acute prostatitis - ketorolac (TORADOL) injection 60 mg; Inject 2 mLs (60 mg total) into the muscle once. - POCT CBC - POCT UA - Microscopic Only - POCT urinalysis dipstick - traMADol (ULTRAM) 50 MG tablet; Take 1 tablet (50 mg total) by mouth every 8 (eight) hours as needed.  Dispense: 30 tablet; Refill: 0 - DG Chest 2 View; Future - Prescript Monitor Profile (9)  2. Constipation, unspecified constipation type - docusate sodium (COLACE) 250 MG capsule; Take 1 capsule (250 mg total) by mouth daily.  Dispense: 10 capsule; Refill: 0 - polyethylene glycol powder (GLYCOLAX/MIRALAX) powder; Take 17 g by mouth 2 (two) times daily as needed.  Dispense: 3350 g; Refill: 1  3. Loss of weight With wt loss, abnormal labs, and orthostasis, patient needs further evaluation. Sending via EMS to [facility]. Direct admit.    - HIV antibody - DG Chest 2 View; Future -  Heavy metals, random urine - Prescript Monitor Profile (9)   Zsofia Prout PA-C  [facility] 12/04/2014 2:08 PM

## 2014-12-04 NOTE — H&P (Signed)
Family Medicine Teaching [facility] Admission History and Physical Service Pager: [phone]  Patient name: Jimmy [name] Medical record number: [id number] Date of birth: [birth year] Age: 48 y.o. Gender: male  Primary Care Provider: Lucilla Edin, MD Consultants: none Code Status: Full  Chief Complaint: back and abdominal pain  Assessment and Plan: Jimmy [name] is a 49 y.o. male presenting with abdominal and back pain. PMH is significant for Herpes zoster ophthalmicus in June, history of spinal stenosis and low back pain s/p surgery  # Lower abdominal and back pain, prostatitis: has had extensive workup of this issue in the past 5 days with 2 CT abdomen/pelvis and MRI lumbar spine which were negative for abdominal etiology. He has a history of low back pain and spinal stenosis requiring surgery. His lab workup including UA has been vaguely indicative of infection, POC UA at PCP f/u did had nitrites. His urine culture from last admission was negative, however this was done from a cath urine after the DRE so presumably if prostatitis the foley would have bypassed the prostate. His pain on exam is quite diffuse with very light touching, including back, abdomen, pelvis; there is no acute abdomen. - repeat UA and culture - check postvoid residual - continue bactrim DS BID, can consider levofloxacin if still not improving - CMP, CBC, lipase - would hold on repeat imaging unless clinically worsens. - trial tramadol for pain control (s/p toradol in PCP office) - continue pyridium  # Constipation:  - will continue colace and miralax prescribed at PCP follow up  # Weight loss & Anemia: reported history of 20lb weight loss over past several months. Concern from PCP for ?immunocompromised state given recent HZO as well. HIV and urine heavy metals collected at PCP office. CXR was negative there (does have a history of smoking). CBC remarkable for anemia when compared to June 2016 and Oct 2010  from 14>>12 range. No reported early colon cancers in family.  - f/u HIV  - will also collect TSH and FOBT  # Hx HZO: recent infection, treated with acyclovir which he has completed his course.  - monitor clinically  FEN/GI: reg diet / saline lock Prophylaxis: heparin sq  Disposition: admit  History of Present Illness: Jimmy [name] is a 49 y.o. male presenting with lower abdominal pain and back pain. Patient was recently admitted for similar complaints along with urinary retention that had resolved, discharged yesterday. After discharge he says that around 9pm last night the pain came back in both his lower abdomen and low back; also has some pain in his upper abdomen as well that is new. The pain is constant. Pain is not made worse or better with anything that he can think of; no relation to position or eating. He says that he took the bactrim last night and today. He has been able to urinate but has had a decreased stream. He continues to have some chills but no fever. At his PCP follow up today he was also orthostatic; had additional workup there including repeat UA (+nitrite), CXR, CBC, HIV, urine heavy metals. He does report some constipation.  Review Of Systems: Per HPI with the following additions: no CP, no SOB, no n/v/d, +chronic leg weakness Otherwise 12 point review of systems was performed and was unremarkable.  Patient Active Problem List   Diagnosis Date Noted  . Lower abdominal pain 12/04/2014  . Prostatitis 12/03/2014  . Abdominal pain 12/02/2014  . Hypokalemia 12/02/2014  . Urinary retention 12/02/2014  . Prostatitis,  acute   . Abdominal pain, lower   . Chronic lower back pain    Past Medical History: Past Medical History  Diagnosis Date  . Back pain    Past Surgical History: Past Surgical History  Procedure Laterality Date  . Back surgery     Social History: History  Substance Use Topics  . Smoking status: Former Smoker    Quit date: 05/02/2007  .  Smokeless tobacco: Never Used  . Alcohol Use: No   Additional social history: none  Please also refer to relevant sections of EMR.  Family History: Family History  Problem Relation Age of Onset  . Heart disease Mother   . Heart disease Maternal Grandmother   . Cancer Maternal Grandfather    Allergies and Medications: Allergies  Allergen Reactions  . Antihistamines, Chlorpheniramine-Type     Hallucinations, messes with patient head   No current facility-administered medications on file prior to encounter.   Current Outpatient Prescriptions on File Prior to Encounter  Medication Sig Dispense Refill  . acyclovir (ZOVIRAX) 400 MG tablet Take 2 tablets (800 mg total) by mouth 5 (five) times daily. (Patient not taking: Reported on 11/30/2014) 50 tablet 0  . docusate sodium (COLACE) 250 MG capsule Take 1 capsule (250 mg total) by mouth daily. 10 capsule 0  . HYDROcodone-acetaminophen (NORCO/VICODIN) 5-325 MG per tablet Take 1-2 tablets by mouth every 4 (four) hours as needed. (Patient not taking: Reported on 11/30/2014) 20 tablet 0  . ibuprofen (ADVIL,MOTRIN) 200 MG tablet Take 600 mg by mouth every 6 (six) hours as needed for moderate pain.    Marland Kitchen ondansetron (ZOFRAN ODT) 4 MG disintegrating tablet Take 1 tablet (4 mg total) by mouth every 8 (eight) hours as needed for nausea or vomiting. (Patient not taking: Reported on 12/04/2014) 20 tablet 0  . phenazopyridine (PYRIDIUM) 100 MG tablet Take 1 tablet (100 mg total) by mouth 3 (three) times daily with meals. 10 tablet 0  . polyethylene glycol powder (GLYCOLAX/MIRALAX) powder Take 17 g by mouth 2 (two) times daily as needed. 3350 g 1  . sulfamethoxazole-trimethoprim (BACTRIM DS,SEPTRA DS) 800-160 MG per tablet Take 1 tablet by mouth 2 (two) times daily. 84 tablet 0  . tamsulosin (FLOMAX) 0.4 MG CAPS capsule Take 1 capsule (0.4 mg total) by mouth daily. (Patient not taking: Reported on 12/04/2014) 10 capsule 0  . traMADol (ULTRAM) 50 MG tablet Take 1  tablet (50 mg total) by mouth every 8 (eight) hours as needed. 30 tablet 0    Objective: BP 121/73 mmHg  Pulse 86  Temp(Src) 98.5 F (36.9 C) (Oral)  Resp 20  Ht 5\' 11"  (1.803 m)  Wt 198 lb 3.2 oz (89.903 kg)  BMI 27.66 kg/m2  SpO2 100% Exam: General: mild distress, laying on side of bed in complaining of pain Eyes: PERRL, EOMI. Sclera anicteric ENTM: MMM, no oropharyngeal lesions. Neck: supple Cardiovascular: RRR, normal heart sounds, no murmurs appreciated. 2+ radial, PT pulses bilaterally. Respiratory: CTAB, normal effort Abdomen: obese, soft. Diffusely tender to very light touch around his entire abdomen, including to his anterior pelvis but there is no rebound and no guarding. Bowel sounds are normal.  MSK: normal bulk and tone. No deformities.  Skin: no rashes. Upper left face with residual scarring from prior HZO infection. Neuro: alert and oriented x 3, leg strength intact bilaterally. Psych: mood normal. Thought content normal. Speech normal.  Labs and Imaging: CBC BMET   Recent Labs Lab 12/02/14 0558 12/04/14 1151  WBC 5.2 5.4  HGB 12.1* 12.5*  HCT 34.0* 36.9*  PLT 202  --     Recent Labs Lab 12/02/14 0558  NA 134*  K 3.7  CL 104  CO2 23  BUN <5*  CREATININE 0.72  GLUCOSE 120*  CALCIUM 9.1     Ct Abdomen Pelvis Wo Contrast  12/02/2014   CLINICAL DATA:  Recurrent abdominal pain.  Subsequent encounter.  EXAM: CT ABDOMEN AND PELVIS WITHOUT CONTRAST  TECHNIQUE: Multidetector CT imaging of the abdomen and pelvis was performed following the standard protocol without IV contrast.  COMPARISON:  11/30/2014  FINDINGS: There are unremarkable unenhanced appearances of the liver, spleen, pancreas, adrenals and kidneys. There is no hydronephrosis. Ureters are unremarkable. Urinary bladder is collapsed around a Foley catheter, grossly unremarkable. There are normal appearances of the stomach, small bowel and colon. The appendix is normal. The abdominal aorta is normal in  caliber. There is mild atherosclerotic calcification. There is no adenopathy in the abdomen or pelvis.  No acute inflammatory changes are evident in the abdomen or pelvis. There is no bowel obstruction. There is no extraluminal air.  There is a fat containing umbilical hernia.  There is no significant abnormality in the lower chest.  There is no significant skeletal lesion. Mild degenerative disc changes are present at L4-5.  IMPRESSION: No acute findings are evident in the abdomen or pelvis.  Fat containing umbilical hernia   Electronically Signed   By: Ellery Plunk M.D.   On: 12/02/2014 00:58   Dg Chest 2 View  12/04/2014   CLINICAL DATA:  Shortness of breath, fatigue, weight loss, previous history of tobacco use.  EXAM: CHEST  2 VIEW  COMPARISON:  Chest x-ray of February 03, 2009  FINDINGS: The lungs are adequately inflated and clear. The heart and pulmonary vascularity are normal. The mediastinum is normal in width. There is no pleural effusion. The bony thorax is unremarkable.  IMPRESSION: There is no active cardiopulmonary disease.   Electronically Signed   By: David  [place] M.D.   On: 12/04/2014 12:51   Mr Lumbar Spine W Wo Contrast  11/30/2014   CLINICAL DATA:  Initial evaluation for acute back pain.  EXAM: MRI LUMBAR SPINE WITHOUT AND WITH CONTRAST  TECHNIQUE: Multiplanar and multiecho pulse sequences of the lumbar spine were obtained without and with intravenous contrast.  CONTRAST:  20mL MULTIHANCE GADOBENATE DIMEGLUMINE 529 MG/ML IV SOLN  COMPARISON:  Prior MRI from 05/20/2009  FINDINGS: For the purposes of this dictation, the lowest well-formed intervertebral disc spaces presumed to be the L5-S1 level, and there presumed to be 5 lumbar type vertebral bodies.  Transitional lumbosacral anatomy with partial sacralization of the L5 vertebral body. In keeping with prior studies, the transitional segment is labeled L5. Alignment is stable with preservation of the normal lumbar lordosis. Vertebral body  heights preserved. No fracture or listhesis. No marrow edema. Diffusely decreased marrow signal intensity is stable from previous studies.  Conus medullaris terminates normally at the T12 level. Signal intensity within the visualized cord is normal. Nerve roots of the cauda equina within normal limits.  Postoperative changes from prior posterior decompression again seen at L4-5.  Diffuse congenital shortening of the pedicles noted.  Paraspinous soft tissues within normal limits. Urinary bladder distension noted.  No abnormal enhancement.  T11-12:  Negative.  T12-L1:  Negative.  L1-2: Tiny central disc protrusion with associated annular fissure. No canal or foraminal stenosis.  L2-3: Mild diffuse annular disc bulge. Facet and ligamentous hypertrophy. No significant canal or foraminal  stenosis.  L3-4: Mild diffuse disc bulge. Superimposed facet and ligamentous hypertrophy. No significant canal or foraminal stenosis.  L4-5: Postoperative changes from prior decompressive wide laminectomy bilaterally. Thecal sac is widely patent without residual stenosis. Probable small amount of enhancing granulation tissue within the left ventral epidural space. Degenerative disc bulging with disc desiccation and associated endplate changes present. No neural impingement. Mild left foraminal narrowing related to disc bulge.  L5-S1: Transitional disc space without disc bulge or disc protrusion. Mild bilateral facet hypertrophy. No stenosis.  IMPRESSION: 1. Overall stable appearance of the lumbar spine with sequelae of prior decompressive laminectomy at L4-5. No recurrent stenosis at this level. 2. Congenital spinal stenosis with superimposed mild multilevel degenerative changes as above, overall relatively similar to prior MRI from 2011. No severe canal stenosis or evidence of cord compression. 3. Distended urinary bladder.   Electronically Signed   By: Rise Mu M.D.   On: 11/30/2014 03:53   Ct Abdomen Pelvis W  Contrast  11/30/2014   CLINICAL DATA:  Generalized weakness for 4 days. Nausea and vomiting. Abdominal pain.  EXAM: CT ABDOMEN AND PELVIS WITH CONTRAST  TECHNIQUE: Multidetector CT imaging of the abdomen and pelvis was performed using the standard protocol following bolus administration of intravenous contrast.  CONTRAST:  OMNIPAQUE IOHEXOL 300 MG/ML  SOLN  COMPARISON:  None.  FINDINGS: Lower chest:  The included lung bases are clear.  Liver: No focal lesion.  Hepatobiliary: Gallbladder mildly distended without pericholecystic inflammatory change. No common bowel duct dilatation. No intrahepatic biliary dilatation.  Pancreas: Normal.  Spleen: Normal.  Adrenal glands: No nodule.  Kidneys: No hydronephrosis. Mildly prominent distal ureters, likely related to distended bladder. No localizing or focal renal abnormality. Minimally heterogeneous appearance of the cortex of the upper left kidney without discrete lesion.  Stomach/Bowel: Stomach physiologically distended. There are no dilated or thickened small bowel loops. Liquid stool throughout the colon without colonic wall thickening. The appendix is normal.  Vascular/Lymphatic: No retroperitoneal adenopathy. Abdominal aorta is normal in caliber. Mild moderate atherosclerosis of the distal abdominal aorta and iliac branches.  Reproductive: Prostate gland is normal in size.  Bladder: Distended.  Other: No free air, free fluid, or intra-abdominal fluid collection. Fat containing umbilical hernia.  Musculoskeletal: There are no acute or suspicious osseous abnormalities. Transitional lumbosacral anatomy is seen. Postsurgical and degenerative change at L4-L5.  IMPRESSION: 1. Minimal heterogeneity of the upper pole of the left renal cortex, can be seen in the setting of pyelonephritis. Small cortical lesions too small to characterize could have a similar appearance. Correlation with urinalysis recommended. The urinary bladder is distended. 2. Liquid stool in the colon  without colonic wall thickening. 3. Gallbladder appears mildly distended but without CT findings of surrounding inflammation. 4. Fat containing umbilical hernia.   Electronically Signed   By: Rubye Oaks M.D.   On: 11/30/2014 00:51     Nani Ravens, MD 12/04/2014, 2:31 PM PGY-3, [facility] Family Medicine FPTS Intern pager: [phone], text pages welcome

## 2014-12-05 DIAGNOSIS — R634 Abnormal weight loss: Secondary | ICD-10-CM

## 2014-12-05 DIAGNOSIS — R1030 Lower abdominal pain, unspecified: Secondary | ICD-10-CM

## 2014-12-05 DIAGNOSIS — M545 Low back pain: Secondary | ICD-10-CM

## 2014-12-05 DIAGNOSIS — N410 Acute prostatitis: Principal | ICD-10-CM

## 2014-12-05 DIAGNOSIS — G8929 Other chronic pain: Secondary | ICD-10-CM

## 2014-12-05 LAB — HEMOGLOBIN A1C
Hgb A1c MFr Bld: 6.5 % — ABNORMAL HIGH (ref 4.8–5.6)
Mean Plasma Glucose: 140 mg/dL

## 2014-12-05 LAB — BASIC METABOLIC PANEL
Anion gap: 10 (ref 5–15)
BUN: 8 mg/dL (ref 6–20)
CO2: 23 mmol/L (ref 22–32)
Calcium: 9.1 mg/dL (ref 8.9–10.3)
Chloride: 99 mmol/L — ABNORMAL LOW (ref 101–111)
Creatinine, Ser: 1.02 mg/dL (ref 0.61–1.24)
GFR calc Af Amer: >60 mL/min (ref >60)
GFR calc non Af Amer: >60 mL/min (ref >60)
Glucose, Bld: 115 mg/dL — ABNORMAL HIGH (ref 65–99)
Potassium: 3.8 mmol/L (ref 3.5–5.1)
Sodium: 132 mmol/L — ABNORMAL LOW (ref 135–145)

## 2014-12-05 LAB — GLUCOSE, CAPILLARY
Glucose-Capillary: 96 mg/dL (ref 65–99)
Glucose-Capillary: 107 mg/dL — ABNORMAL HIGH (ref 65–99)
Glucose-Capillary: 134 mg/dL — ABNORMAL HIGH (ref 65–99)

## 2014-12-05 LAB — CBC
HCT: 34.7 % — ABNORMAL LOW (ref 39.0–52.0)
Hemoglobin: 12.1 g/dL — ABNORMAL LOW (ref 13.0–17.0)
MCH: 33.2 pg (ref 26.0–34.0)
MCHC: 34.9 g/dL (ref 30.0–36.0)
MCV: 95.3 fL (ref 78.0–100.0)
Platelets: 229 10*3/uL (ref 150–400)
RBC: 3.64 MIL/uL — ABNORMAL LOW (ref 4.22–5.81)
RDW: 12.6 % (ref 11.5–15.5)
WBC: 3.9 10*3/uL — ABNORMAL LOW (ref 4.0–10.5)

## 2014-12-05 LAB — HIV ANTIBODY (ROUTINE TESTING W REFLEX): HIV 1&2 Ab, 4th Generation: NONREACTIVE

## 2014-12-05 MED ORDER — INSULIN ASPART 100 UNIT/ML ~~LOC~~ SOLN
0.0000 [IU] | Freq: Three times a day (TID) | SUBCUTANEOUS | Status: DC
  Administered 2014-12-07: 1 [IU] via SUBCUTANEOUS

## 2014-12-05 MED ORDER — LIVING WELL WITH DIABETES BOOK
Freq: Once | Status: DC
  Filled 2014-12-05: qty 1

## 2014-12-05 NOTE — Progress Notes (Signed)
Soap sud enema given. Patient produced large amount of liquid brown stool with mutplie pellet sized hard stools. Patient stated that he felt he had fully emptied his bowels.

## 2014-12-05 NOTE — Progress Notes (Signed)
Initial Nutrition Assessment  DOCUMENTATION CODES:    Not applicable  INTERVENTION:  -Ensure Enlive po daily, each supplement provides 350 kcal and 20 grams of protein  -Offer alternate meal choices to maximize oral intake  NUTRITION DIAGNOSIS:  Inadequate oral intake related to poor appetite as evidenced by pt/ family report   GOAL:  Pt to meet >/= 90% of their estimated nutrition needs      MONITOR:  Po intake, labs and wt trends     REASON FOR ASSESSMENT: Malnutrition Screen     ASSESSMENT: Pt reports a general decline in health over the past 2-3 months, due to developing a stomach virus and shingles ("it's been one thing after the other").   Pt assessed by RD on 8/3. He continues to affirm poor appetite but has consumed >75% of his breakfast. He is c/o constipation and says he's not had a BM in a week. MD has addressed this he says. His wt has increased 3# since last assessed.   Pt reports UBW is 225#, which he last weighed over a year ago which is a decrease of 12% and not clinically significant for time frame.   Nutrition-Focused physical exam completed: WDL  Diet Order:  Diet Carb Modified Fluid consistency:: Thin; Room service appropriate?: Yes  Skin:   WDL  Last BM:   11/29/14  Height:   Ht Readings from Last 1 Encounters:  12/04/14  (1.803 m)    Weight:   Wt Readings from Last 1 Encounters:  12/04/14 198 lb 3.2 oz (89.903 kg)    Ideal Body Weight:    78kg  BMI:  Body mass index is 27.66 kg/(m^2).  Estimated Nutritional Needs:   Kcal:   2000-2200  Protein:   100-110 gr  Fluid:    2.0-2.2 liters daily  EDUCATION NEEDS:  None identified    Royann Shivers MS,RD,CSG,LDN Office: [phone] Pager: [phone]

## 2014-12-05 NOTE — Progress Notes (Signed)
Family Medicine Teaching Service Daily Progress Note Intern Pager: [phone]  Patient name: Jimmy [name] Medical record number: [id number] Date of birth: 04/03/1966 Age: 49 y.o. Gender: male  Primary Care Provider: Lucilla Edin, MD Consultants: none Code Status: full code  Pt Overview and Major Events to Date:  8/5 - readmission for pain control  Assessment and Plan: Jimmy [name] is a 49 y.o. male presenting with abdominal and back pain. PMH is significant for Herpes zoster ophthalmicus in June, history of spinal stenosis and low back pain s/p surgery  # Lower abdominal and back pain, prostatitis: Likely related to constipation and chronic MSK pain s/p multiple back surgeries. Voiding well - f/u UCx - continue bactrim DS BID, can consider levofloxacin if still not improving - would hold on repeat imaging unless clinically worsens. - d/c Morphine. Norco for pain control - continue pyridium - PT eval and treat  # Constipation: Ongoing - will continue colace and miralax prescribed at PCP follow up - Soap suds enema today  # Weight loss & Anemia: reported history of 20lb weight loss over past several months. CXR neg. CBC remarkable for anemia when compared to June 2016 and Oct 2010 from 14>>12 range. No reported early colon cancers in family. HIV negative, TSH wnl. Could be related to recent infections and decreased PO intake. - FOBT pending  - Consider outpatient colonoscopy  # T2DM: A1c 6.5 - CBG ACHS, Sensitive SSI - Consider starting Metformin on d/c  # Hx HZO: recent infection, s/p acyclovir   - monitor clinically  FEN/GI: reg diet / saline lock Prophylaxis: heparin sq  Disposition: Med-surg, d/c pending clinical improvement  Subjective:  Reports that pain is well controlled on Morphine. Has not had BM in 4-5 days.  Objective: Temp:  [97.7 F (36.5 C)-98.7 F (37.1 C)] 98.6 F (37 C) (08/06 0610) Pulse Rate:  [86-103] 93 (08/06 0610) Resp:  [13-20] 13  (08/06 0610) BP: (121-140)/(68-76) 125/73 mmHg (08/06 0610) SpO2:  [99 %-100 %] 100 % (08/06 0610) Weight:  [198 lb 3.2 oz (89.903 kg)-200 lb 9.6 oz (90.992 kg)] 198 lb 3.2 oz (89.903 kg) (08/05 1407) Physical Exam: General: NAD, sitting on bedside eating breakfast HEENT: NCAT, MMM, EOMI. Sclera anicteric CV: RRR, no murmurs appreciated. Intact distal pulses Respiratory: CTAB, normal effort Abdomen: obese, soft. Diffusely tender to very light touch around his entire abdomen, no rebound and no guarding. Bowel sounds are normal.  MSK: normal bulk and tone. No deformities. TTP over L and R paraspinal muscles in lumbar region. Able to move around bed well Skin: no rashes. Upper left face with residual scarring from prior HZO infection. Neuro: alert and oriented x 3, leg strength intact bilaterally. Psych: mood normal. Thought content normal. Speech normal.  Laboratory:  Recent Labs Lab 12/02/14 0558 12/04/14 1151 12/04/14 1510 12/05/14 0446  WBC 5.2 5.4 4.2 3.9*  HGB 12.1* 12.5* 12.0* 12.1*  HCT 34.0* 36.9* 34.0* 34.7*  PLT 202  --  224 229    Recent Labs Lab 11/29/14 2238  12/01/14 2020 12/02/14 0558 12/04/14 1510 12/05/14 0446  NA  --   < > 129* 134* 128* 132*  K  --   < > 3.4* 3.7 3.5 3.8  CL  --   < > 97* 104 96* 99*  CO2  --   < > 20* 23 21* 23  BUN  --   < > 6 <5* <5* 8  CREATININE  --   < > 0.84 0.72 1.11 1.02  CALCIUM  --   < > 9.5 9.1 9.3 9.1  PROT 6.0*  --  6.7  --  6.5  --   BILITOT 0.7  --  0.8  --  0.7  --   ALKPHOS 48  --  56  --  51  --   ALT 12*  --  14*  --  19  --   AST 15  --  19  --  20  --   GLUCOSE  --   < > 114* 120* 139* 115*  < > = values in this interval not displayed.  Urinalysis    Component Value Date/Time   COLORURINE AMBER* 12/04/2014 1529   APPEARANCEUR CLEAR 12/04/2014 1529   LABSPEC 1.011 12/04/2014 1529   PHURINE 6.5 12/04/2014 1529   GLUCOSEU NEGATIVE 12/04/2014 1529   HGBUR NEGATIVE 12/04/2014 1529   BILIRUBINUR NEGATIVE  12/04/2014 1529   BILIRUBINUR neg 12/04/2014 1151   KETONESUR NEGATIVE 12/04/2014 1529   PROTEINUR NEGATIVE 12/04/2014 1529   PROTEINUR neg 12/04/2014 1151   UROBILINOGEN 1.0 12/04/2014 1529   UROBILINOGEN 1.0 12/04/2014 1151   NITRITE POSITIVE* 12/04/2014 1529   NITRITE pos 12/04/2014 1151   LEUKOCYTESUR NEGATIVE 12/04/2014 1529    Hgb A1c 6.5  TSH 1.145  Lipase 29  Micro:  UCx pending  Imaging/Diagnostic Tests: Ct Abdomen Pelvis Wo Contrast  12/02/2014   CLINICAL DATA:  Recurrent abdominal pain.  Subsequent encounter.  EXAM: CT ABDOMEN AND PELVIS WITHOUT CONTRAST  TECHNIQUE: Multidetector CT imaging of the abdomen and pelvis was performed following the standard protocol without IV contrast.  COMPARISON:  11/30/2014  FINDINGS: There are unremarkable unenhanced appearances of the liver, spleen, pancreas, adrenals and kidneys. There is no hydronephrosis. Ureters are unremarkable. Urinary bladder is collapsed around a Foley catheter, grossly unremarkable. There are normal appearances of the stomach, small bowel and colon. The appendix is normal. The abdominal aorta is normal in caliber. There is mild atherosclerotic calcification. There is no adenopathy in the abdomen or pelvis.  No acute inflammatory changes are evident in the abdomen or pelvis. There is no bowel obstruction. There is no extraluminal air.  There is a fat containing umbilical hernia.  There is no significant abnormality in the lower chest.  There is no significant skeletal lesion. Mild degenerative disc changes are present at L4-5.  IMPRESSION: No acute findings are evident in the abdomen or pelvis.  Fat containing umbilical hernia   Electronically Signed   By: Ellery Plunk M.D.   On: 12/02/2014 00:58   Dg Chest 2 View  12/04/2014   CLINICAL DATA:  Shortness of breath, fatigue, weight loss, previous history of tobacco use.  EXAM: CHEST  2 VIEW  COMPARISON:  Chest x-ray of February 03, 2009  FINDINGS: The lungs are adequately  inflated and clear. The heart and pulmonary vascularity are normal. The mediastinum is normal in width. There is no pleural effusion. The bony thorax is unremarkable.  IMPRESSION: There is no active cardiopulmonary disease.   Electronically Signed   By: David  [place] M.D.   On: 12/04/2014 12:51   Mr Lumbar Spine W Wo Contrast  11/30/2014   CLINICAL DATA:  Initial evaluation for acute back pain.  EXAM: MRI LUMBAR SPINE WITHOUT AND WITH CONTRAST  TECHNIQUE: Multiplanar and multiecho pulse sequences of the lumbar spine were obtained without and with intravenous contrast.  CONTRAST:  20mL MULTIHANCE GADOBENATE DIMEGLUMINE 529 MG/ML IV SOLN  COMPARISON:  Prior MRI from 05/20/2009  FINDINGS: For the purposes  of this dictation, the lowest well-formed intervertebral disc spaces presumed to be the L5-S1 level, and there presumed to be 5 lumbar type vertebral bodies.  Transitional lumbosacral anatomy with partial sacralization of the L5 vertebral body. In keeping with prior studies, the transitional segment is labeled L5. Alignment is stable with preservation of the normal lumbar lordosis. Vertebral body heights preserved. No fracture or listhesis. No marrow edema. Diffusely decreased marrow signal intensity is stable from previous studies.  Conus medullaris terminates normally at the T12 level. Signal intensity within the visualized cord is normal. Nerve roots of the cauda equina within normal limits.  Postoperative changes from prior posterior decompression again seen at L4-5.  Diffuse congenital shortening of the pedicles noted.  Paraspinous soft tissues within normal limits. Urinary bladder distension noted.  No abnormal enhancement.  T11-12:  Negative.  T12-L1:  Negative.  L1-2: Tiny central disc protrusion with associated annular fissure. No canal or foraminal stenosis.  L2-3: Mild diffuse annular disc bulge. Facet and ligamentous hypertrophy. No significant canal or foraminal stenosis.  L3-4: Mild diffuse disc bulge.  Superimposed facet and ligamentous hypertrophy. No significant canal or foraminal stenosis.  L4-5: Postoperative changes from prior decompressive wide laminectomy bilaterally. Thecal sac is widely patent without residual stenosis. Probable small amount of enhancing granulation tissue within the left ventral epidural space. Degenerative disc bulging with disc desiccation and associated endplate changes present. No neural impingement. Mild left foraminal narrowing related to disc bulge.  L5-S1: Transitional disc space without disc bulge or disc protrusion. Mild bilateral facet hypertrophy. No stenosis.  IMPRESSION: 1. Overall stable appearance of the lumbar spine with sequelae of prior decompressive laminectomy at L4-5. No recurrent stenosis at this level. 2. Congenital spinal stenosis with superimposed mild multilevel degenerative changes as above, overall relatively similar to prior MRI from 2011. No severe canal stenosis or evidence of cord compression. 3. Distended urinary bladder.   Electronically Signed   By: Rise Mu M.D.   On: 11/30/2014 03:53   Ct Abdomen Pelvis W Contrast  11/30/2014   CLINICAL DATA:  Generalized weakness for 4 days. Nausea and vomiting. Abdominal pain.  EXAM: CT ABDOMEN AND PELVIS WITH CONTRAST  TECHNIQUE: Multidetector CT imaging of the abdomen and pelvis was performed using the standard protocol following bolus administration of intravenous contrast.  CONTRAST:  OMNIPAQUE IOHEXOL 300 MG/ML  SOLN  COMPARISON:  None.  FINDINGS: Lower chest:  The included lung bases are clear.  Liver: No focal lesion.  Hepatobiliary: Gallbladder mildly distended without pericholecystic inflammatory change. No common bowel duct dilatation. No intrahepatic biliary dilatation.  Pancreas: Normal.  Spleen: Normal.  Adrenal glands: No nodule.  Kidneys: No hydronephrosis. Mildly prominent distal ureters, likely related to distended bladder. No localizing or focal renal abnormality. Minimally  heterogeneous appearance of the cortex of the upper left kidney without discrete lesion.  Stomach/Bowel: Stomach physiologically distended. There are no dilated or thickened small bowel loops. Liquid stool throughout the colon without colonic wall thickening. The appendix is normal.  Vascular/Lymphatic: No retroperitoneal adenopathy. Abdominal aorta is normal in caliber. Mild moderate atherosclerosis of the distal abdominal aorta and iliac branches.  Reproductive: Prostate gland is normal in size.  Bladder: Distended.  Other: No free air, free fluid, or intra-abdominal fluid collection. Fat containing umbilical hernia.  Musculoskeletal: There are no acute or suspicious osseous abnormalities. Transitional lumbosacral anatomy is seen. Postsurgical and degenerative change at L4-L5.  IMPRESSION: 1. Minimal heterogeneity of the upper pole of the left renal cortex, can be seen  in the setting of pyelonephritis. Small cortical lesions too small to characterize could have a similar appearance. Correlation with urinalysis recommended. The urinary bladder is distended. 2. Liquid stool in the colon without colonic wall thickening. 3. Gallbladder appears mildly distended but without CT findings of surrounding inflammation. 4. Fat containing umbilical hernia.   Electronically Signed   By: Rubye Oaks M.D.   On: 11/30/2014 00:51    Erasmo Downer, MD, MPH 12/05/2014, 8:53 AM PGY-2, [facility] FPTS Intern pager: [phone], text pages welcome

## 2014-12-05 NOTE — Progress Notes (Signed)
PT Cancellation/Discharge Note  Patient Details Name: Jimmy [name] MRN: [id number] DOB: 06/30/1965   Cancelled Treatment:    Reason Eval/Treat Not Completed: PT screened, no needs identified, will sign off.  Pt is independently walking around his room. He has no acute or f/u PT needs. His back issues are chronic.  His significant other is in his room and reports she will encourage him to walk the halls.   PT to sign off.   Thanks,    Rollene Rotunda. Khalidah Herbold, PT, DPT [phone]   12/05/2014, 4:28 PM

## 2014-12-05 NOTE — Progress Notes (Signed)
Spoke with pt on the phone about new diabetes diagnosis. Discussed A1C results (6.5% on 12/04/14) and explained what an A1C is, basic pathophysiology of DM Type 2, basic home care, importance of checking CBGs and maintaining good CBG control to prevent long-term and short-term complications. Patient verbalized feeling blind sided by DM diagnosis and does not know why we even drew the blood panel for it because "that is not why he came into the hospital." Spoke to patient about the possibility of being discharged on Metformin. Patient mentions that he will comply with what the doctor here says but he will get his PCP to redo everything as far as DM go. Patient reports that he will check his glucose and show to his PCP.  RNs to provide ongoing basic DM education at bedside with this patient and engage patient to actively check blood glucose. Have ordered educational booklet and DM videos.  MD: patient will need glucose meter kit when discharged (order # [id number])  Thanks,  Tama Headings RN, MSN, [facility] Inpatient Diabetes Coordinator Team Pager [phone]

## 2014-12-06 ENCOUNTER — Inpatient Hospital Stay (HOSPITAL_COMMUNITY): Payer: Medicare Other

## 2014-12-06 LAB — GLUCOSE, CAPILLARY
Glucose-Capillary: 122 mg/dL — ABNORMAL HIGH (ref 65–99)
Glucose-Capillary: 112 mg/dL — ABNORMAL HIGH (ref 65–99)
Glucose-Capillary: 107 mg/dL — ABNORMAL HIGH (ref 65–99)
Glucose-Capillary: 106 mg/dL — ABNORMAL HIGH (ref 65–99)

## 2014-12-06 LAB — URINE CULTURE: Culture: 5000

## 2014-12-06 MED ORDER — GABAPENTIN 600 MG PO TABS
300.0000 mg | ORAL_TABLET | Freq: Three times a day (TID) | ORAL | Status: DC
  Administered 2014-12-06: 23:00:00 via ORAL
  Administered 2014-12-06 – 2014-12-10 (×14): 300 mg via ORAL
  Filled 2014-12-06 (×16): qty 1

## 2014-12-06 MED ORDER — MORPHINE SULFATE 2 MG/ML IJ SOLN
2.0000 mg | Freq: Once | INTRAMUSCULAR | Status: AC
  Administered 2014-12-06: 2 mg via INTRAVENOUS
  Filled 2014-12-06: qty 1

## 2014-12-06 NOTE — Progress Notes (Signed)
Pt awake but refused diabetic education videos at this time. Requesting to view videos later during the day. Discussed printed information with patient including diet restrictions/changes. Stated he will discuss new diagnosis with PCP. Will continue to monitor.

## 2014-12-06 NOTE — Progress Notes (Signed)
Family Medicine Teaching Service Daily Progress Note Intern Pager: [phone]  Patient name: Jimmy [name] Medical record number: [id number] Date of birth: 1966/03/25 Age: 49 y.o. Gender: male  Primary Care Provider: Lucilla Edin, MD Consultants: none Code Status: full code  Pt Overview and Major Events to Date:  8/5 - readmission for pain control  Assessment and Plan: Jimmy [name] is a 49 y.o. male presenting with abdominal and back pain. PMH is significant for Herpes zoster ophthalmicus in June, history of spinal stenosis and low back pain s/p surgery  # Lower abdominal and back pain, prostatitis: Likely related to constipation and chronic MSK pain s/p multiple back surgeries. Voiding well. Unlikely intraabdominal/renal process given multiple negative CTs - f/u UCx - continue bactrim DS BID, can consider levofloxacin if still not improving - would hold on repeat imaging unless clinically worsens. - Norco for pain control and 1 time Morphine IV 2mg  this AM - continue pyridium - PT eval and treat  # Constipation: Resolved s/p soap suds enema - Continue colace and miralax  # Weight loss & Anemia: reported history of 20lb weight loss over past several months. CXR neg. CBC remarkable for anemia when compared to June 2016 and Oct 2010 from 14>>12 range. No reported early colon cancers in family. HIV negative, TSH wnl. Could be related to recent infections and decreased PO intake. - FOBT pending  - Consider outpatient colonoscopy  # T2DM: A1c 6.5 on admission. CBGs well controlled - CBG ACHS, Sensitive SSI - Consider starting Metformin on d/c  # Hx HZO: recent infection, s/p acyclovir   - monitor clinically  FEN/GI: reg diet / saline lock Prophylaxis: heparin sq  Disposition: Med-surg, d/c pending clinical improvement and pain control  Subjective:  Reports that pain recurred in R hip this time early this AM.  Feels somewhat dizzy too, but thinks it is all related to pain.   Abd had improved yesterday after large BM  Objective: Temp:  [98.3 F (36.8 C)-98.6 F (37 C)] 98.6 F (37 C) (08/07 0521) Pulse Rate:  [91-99] 99 (08/07 0521) Resp:  [16-18] 18 (08/07 0521) BP: (100-126)/(62-79) 110/70 mmHg (08/07 0521) SpO2:  [99 %-100 %] 100 % (08/07 0521) Physical Exam: General: mild distress, writhing in bed CV: RRR, no murmurs appreciated. Intact distal pulses Respiratory: CTAB, normal effort Abdomen: obese, soft. Diffusely tender to very light touch around his entire abdomen, no rebound and no guarding. Bowel sounds are normal.  MSK: normal bulk and tone. No deformities. TTP over L and R paraspinal muscles in lumbar region and R hip Skin: no rashes. Upper left face with residual scarring from prior HZO infection. Neuro: alert and oriented x 3, leg strength intact bilaterally. Psych: mood normal. Thought content normal. Speech normal.  Laboratory:  Recent Labs Lab 12/02/14 0558 12/04/14 1151 12/04/14 1510 12/05/14 0446  WBC 5.2 5.4 4.2 3.9*  HGB 12.1* 12.5* 12.0* 12.1*  HCT 34.0* 36.9* 34.0* 34.7*  PLT 202  --  224 229    Recent Labs Lab 11/29/14 2238  12/01/14 2020 12/02/14 0558 12/04/14 1510 12/05/14 0446  NA  --   < > 129* 134* 128* 132*  K  --   < > 3.4* 3.7 3.5 3.8  CL  --   < > 97* 104 96* 99*  CO2  --   < > 20* 23 21* 23  BUN  --   < > 6 <5* <5* 8  CREATININE  --   < > 0.84 0.72 1.11  1.02  CALCIUM  --   < > 9.5 9.1 9.3 9.1  PROT 6.0*  --  6.7  --  6.5  --   BILITOT 0.7  --  0.8  --  0.7  --   ALKPHOS 48  --  56  --  51  --   ALT 12*  --  14*  --  19  --   AST 15  --  19  --  20  --   GLUCOSE  --   < > 114* 120* 139* 115*  < > = values in this interval not displayed.  Urinalysis    Component Value Date/Time   COLORURINE AMBER* 12/04/2014 1529   APPEARANCEUR CLEAR 12/04/2014 1529   LABSPEC 1.011 12/04/2014 1529   PHURINE 6.5 12/04/2014 1529   GLUCOSEU NEGATIVE 12/04/2014 1529   HGBUR NEGATIVE 12/04/2014 1529   BILIRUBINUR  NEGATIVE 12/04/2014 1529   BILIRUBINUR neg 12/04/2014 1151   KETONESUR NEGATIVE 12/04/2014 1529   PROTEINUR NEGATIVE 12/04/2014 1529   PROTEINUR neg 12/04/2014 1151   UROBILINOGEN 1.0 12/04/2014 1529   UROBILINOGEN 1.0 12/04/2014 1151   NITRITE POSITIVE* 12/04/2014 1529   NITRITE pos 12/04/2014 1151   LEUKOCYTESUR NEGATIVE 12/04/2014 1529    Hgb A1c 6.5  TSH 1.145  Lipase 29  Micro:  UCx pending  Imaging/Diagnostic Tests: Ct Abdomen Pelvis Wo Contrast  12/02/2014   CLINICAL DATA:  Recurrent abdominal pain.  Subsequent encounter.  EXAM: CT ABDOMEN AND PELVIS WITHOUT CONTRAST  TECHNIQUE: Multidetector CT imaging of the abdomen and pelvis was performed following the standard protocol without IV contrast.  COMPARISON:  11/30/2014  FINDINGS: There are unremarkable unenhanced appearances of the liver, spleen, pancreas, adrenals and kidneys. There is no hydronephrosis. Ureters are unremarkable. Urinary bladder is collapsed around a Foley catheter, grossly unremarkable. There are normal appearances of the stomach, small bowel and colon. The appendix is normal. The abdominal aorta is normal in caliber. There is mild atherosclerotic calcification. There is no adenopathy in the abdomen or pelvis.  No acute inflammatory changes are evident in the abdomen or pelvis. There is no bowel obstruction. There is no extraluminal air.  There is a fat containing umbilical hernia.  There is no significant abnormality in the lower chest.  There is no significant skeletal lesion. Mild degenerative disc changes are present at L4-5.  IMPRESSION: No acute findings are evident in the abdomen or pelvis.  Fat containing umbilical hernia   Electronically Signed   By: Ellery Plunk M.D.   On: 12/02/2014 00:58   Dg Chest 2 View  12/04/2014   CLINICAL DATA:  Shortness of breath, fatigue, weight loss, previous history of tobacco use.  EXAM: CHEST  2 VIEW  COMPARISON:  Chest x-ray of February 03, 2009  FINDINGS: The lungs are  adequately inflated and clear. The heart and pulmonary vascularity are normal. The mediastinum is normal in width. There is no pleural effusion. The bony thorax is unremarkable.  IMPRESSION: There is no active cardiopulmonary disease.   Electronically Signed   By: David  [place] M.D.   On: 12/04/2014 12:51   Mr Lumbar Spine W Wo Contrast  11/30/2014   CLINICAL DATA:  Initial evaluation for acute back pain.  EXAM: MRI LUMBAR SPINE WITHOUT AND WITH CONTRAST  TECHNIQUE: Multiplanar and multiecho pulse sequences of the lumbar spine were obtained without and with intravenous contrast.  CONTRAST:  20mL MULTIHANCE GADOBENATE DIMEGLUMINE 529 MG/ML IV SOLN  COMPARISON:  Prior MRI from 05/20/2009  FINDINGS: For  the purposes of this dictation, the lowest well-formed intervertebral disc spaces presumed to be the L5-S1 level, and there presumed to be 5 lumbar type vertebral bodies.  Transitional lumbosacral anatomy with partial sacralization of the L5 vertebral body. In keeping with prior studies, the transitional segment is labeled L5. Alignment is stable with preservation of the normal lumbar lordosis. Vertebral body heights preserved. No fracture or listhesis. No marrow edema. Diffusely decreased marrow signal intensity is stable from previous studies.  Conus medullaris terminates normally at the T12 level. Signal intensity within the visualized cord is normal. Nerve roots of the cauda equina within normal limits.  Postoperative changes from prior posterior decompression again seen at L4-5.  Diffuse congenital shortening of the pedicles noted.  Paraspinous soft tissues within normal limits. Urinary bladder distension noted.  No abnormal enhancement.  T11-12:  Negative.  T12-L1:  Negative.  L1-2: Tiny central disc protrusion with associated annular fissure. No canal or foraminal stenosis.  L2-3: Mild diffuse annular disc bulge. Facet and ligamentous hypertrophy. No significant canal or foraminal stenosis.  L3-4: Mild diffuse  disc bulge. Superimposed facet and ligamentous hypertrophy. No significant canal or foraminal stenosis.  L4-5: Postoperative changes from prior decompressive wide laminectomy bilaterally. Thecal sac is widely patent without residual stenosis. Probable small amount of enhancing granulation tissue within the left ventral epidural space. Degenerative disc bulging with disc desiccation and associated endplate changes present. No neural impingement. Mild left foraminal narrowing related to disc bulge.  L5-S1: Transitional disc space without disc bulge or disc protrusion. Mild bilateral facet hypertrophy. No stenosis.  IMPRESSION: 1. Overall stable appearance of the lumbar spine with sequelae of prior decompressive laminectomy at L4-5. No recurrent stenosis at this level. 2. Congenital spinal stenosis with superimposed mild multilevel degenerative changes as above, overall relatively similar to prior MRI from 2011. No severe canal stenosis or evidence of cord compression. 3. Distended urinary bladder.   Electronically Signed   By: Rise Mu M.D.   On: 11/30/2014 03:53   Ct Abdomen Pelvis W Contrast  11/30/2014   CLINICAL DATA:  Generalized weakness for 4 days. Nausea and vomiting. Abdominal pain.  EXAM: CT ABDOMEN AND PELVIS WITH CONTRAST  TECHNIQUE: Multidetector CT imaging of the abdomen and pelvis was performed using the standard protocol following bolus administration of intravenous contrast.  CONTRAST:  OMNIPAQUE IOHEXOL 300 MG/ML  SOLN  COMPARISON:  None.  FINDINGS: Lower chest:  The included lung bases are clear.  Liver: No focal lesion.  Hepatobiliary: Gallbladder mildly distended without pericholecystic inflammatory change. No common bowel duct dilatation. No intrahepatic biliary dilatation.  Pancreas: Normal.  Spleen: Normal.  Adrenal glands: No nodule.  Kidneys: No hydronephrosis. Mildly prominent distal ureters, likely related to distended bladder. No localizing or focal renal abnormality.  Minimally heterogeneous appearance of the cortex of the upper left kidney without discrete lesion.  Stomach/Bowel: Stomach physiologically distended. There are no dilated or thickened small bowel loops. Liquid stool throughout the colon without colonic wall thickening. The appendix is normal.  Vascular/Lymphatic: No retroperitoneal adenopathy. Abdominal aorta is normal in caliber. Mild moderate atherosclerosis of the distal abdominal aorta and iliac branches.  Reproductive: Prostate gland is normal in size.  Bladder: Distended.  Other: No free air, free fluid, or intra-abdominal fluid collection. Fat containing umbilical hernia.  Musculoskeletal: There are no acute or suspicious osseous abnormalities. Transitional lumbosacral anatomy is seen. Postsurgical and degenerative change at L4-L5.  IMPRESSION: 1. Minimal heterogeneity of the upper pole of the left renal cortex, can  be seen in the setting of pyelonephritis. Small cortical lesions too small to characterize could have a similar appearance. Correlation with urinalysis recommended. The urinary bladder is distended. 2. Liquid stool in the colon without colonic wall thickening. 3. Gallbladder appears mildly distended but without CT findings of surrounding inflammation. 4. Fat containing umbilical hernia.   Electronically Signed   By: Rubye Oaks M.D.   On: 11/30/2014 00:51    Erasmo Downer, MD, MPH 12/06/2014, 7:16 AM PGY-2, [facility] FPTS Intern pager: [phone], text pages welcome

## 2014-12-07 DIAGNOSIS — I951 Orthostatic hypotension: Secondary | ICD-10-CM

## 2014-12-07 LAB — BASIC METABOLIC PANEL
Anion gap: 9 (ref 5–15)
BUN: 12 mg/dL (ref 6–20)
CO2: 22 mmol/L (ref 22–32)
Calcium: 9.3 mg/dL (ref 8.9–10.3)
Chloride: 100 mmol/L — ABNORMAL LOW (ref 101–111)
Creatinine, Ser: 0.86 mg/dL (ref 0.61–1.24)
GFR calc Af Amer: >60 mL/min (ref >60)
GFR calc non Af Amer: >60 mL/min (ref >60)
Glucose, Bld: 110 mg/dL — ABNORMAL HIGH (ref 65–99)
Potassium: 4.3 mmol/L (ref 3.5–5.1)
Sodium: 131 mmol/L — ABNORMAL LOW (ref 135–145)

## 2014-12-07 LAB — GLUCOSE, CAPILLARY
Glucose-Capillary: 109 mg/dL — ABNORMAL HIGH (ref 65–99)
Glucose-Capillary: 125 mg/dL — ABNORMAL HIGH (ref 65–99)
Glucose-Capillary: 115 mg/dL — ABNORMAL HIGH (ref 65–99)
Glucose-Capillary: 83 mg/dL (ref 65–99)
Glucose-Capillary: 116 mg/dL — ABNORMAL HIGH (ref 65–99)

## 2014-12-07 LAB — CBC
HCT: 36.5 % — ABNORMAL LOW (ref 39.0–52.0)
Hemoglobin: 12.5 g/dL — ABNORMAL LOW (ref 13.0–17.0)
MCH: 33.1 pg (ref 26.0–34.0)
MCHC: 34.2 g/dL (ref 30.0–36.0)
MCV: 96.6 fL (ref 78.0–100.0)
Platelets: 215 10*3/uL (ref 150–400)
RBC: 3.78 MIL/uL — ABNORMAL LOW (ref 4.22–5.81)
RDW: 12.5 % (ref 11.5–15.5)
WBC: 3.8 10*3/uL — ABNORMAL LOW (ref 4.0–10.5)

## 2014-12-07 LAB — C-REACTIVE PROTEIN: CRP: 1.2 mg/dL — ABNORMAL HIGH (ref <1.0)

## 2014-12-07 LAB — SEDIMENTATION RATE: Sed Rate: 30 mm/hr — ABNORMAL HIGH (ref 0–16)

## 2014-12-07 MED ORDER — SODIUM CHLORIDE 0.9 % IV BOLUS (SEPSIS)
1000.0000 mL | Freq: Once | INTRAVENOUS | Status: AC
  Administered 2014-12-07: 1000 mL via INTRAVENOUS

## 2014-12-07 MED ORDER — MORPHINE SULFATE 2 MG/ML IJ SOLN
2.0000 mg | INTRAMUSCULAR | Status: DC | PRN
  Administered 2014-12-07: 2 mg via INTRAVENOUS
  Filled 2014-12-07: qty 1

## 2014-12-07 NOTE — Progress Notes (Signed)
Family Medicine Teaching Service Daily Progress Note Intern Pager: [phone]  Patient name: Jimmy [name] Medical record number: [id number] Date of birth: 1965-11-23 Age: 49 y.o. Gender: male  Primary Care Provider: Lucilla Edin, MD Consultants: none Code Status: full code  Pt Overview and Major Events to Date:  08/05 - readmission for pain control 08/07 - Foley placed for urinary retention  Assessment and Plan: Jimmy [name] is a 49 y.o. male presenting with abdominal and back pain. PMH is significant for Herpes zoster ophthalmicus two months ago, history of spinal stenosis and low back pain s/p surgery.  # Lower abdominal and back pain, prostatitis: Likely related to constipation and chronic MSK pain s/p multiple back surgeries. Unlikely intra-abdominal/renal process given multiple negative CTs. Urine cultures no growth. WBC 3.8. CRP and sed rate both elevated. Received one dose IV morphine overnight (08/09). - Continue bactrim DS BID - Gabapentin 300 mg TID for pain control - Continue pyridium - PT eval and treat - D/c Flexeril, start Baclofen  TID PRN - Add meloxicam, PO morphine, Effexor  # Hypotension: high 90s/60s. Patient reports severe dizziness upon sitting or standing. Received one bolus yesterday with only mild effect.  - Positive orthostatics - BP 65/35 upon standing with HR 143 - AM cortisol (08/10)  # Urinary retention: Occurred during prior admission last week as well. Foley placed on 08/07. May be due to enlarged prostate from prostatitis.  - Cont Foley - Voiding trial today (08/09) - Resume Flomax  # Constipation: Resolved s/p soap suds enema - Continue colace  - Increased Miralax to scheduled BID   # Weight loss & Anemia: reported history of 20lb weight loss over past several months. CXR neg. CBC remarkable for anemia when compared to June 2016 and Oct 2010 from 14>>12 range. No reported early colon cancers in family. HIV negative, TSH wnl. Could be  related to recent infections and decreased PO intake. Hgb 12.5. - FOBT pending  - Consider outpatient colonoscopy  # T2DM: A1c 6.5 on admission. CBGs well controlled. Consider beginning metformin on d/c. Patient had episode of dizziness and weakness after insulin administration so insulin d/ced. CBGs average low 100s in past 24 hrs. - CBG ACHS  # Hx HZO: recent infection, s/p acyclovir  - Monitor clinically  FEN/GI: reg diet / saline lock Prophylaxis: heparin sq  Disposition: home pending medical improvement   Subjective:  Patient reports that his pain has improved since getting a dose of morphine last night. He describes his pain of 6/10 now. He is still very concerned about his blood pressure, but says that he has not tried to get out of bed at all since I saw him yesterday afternoon.   Objective: Temp:  [97.5 F (36.4 C)-98.4 F (36.9 C)] 97.5 F (36.4 C) (08/09 0640) Pulse Rate:  [97-114] 97 (08/09 0640) Resp:  [18] 18 (08/08 2142) BP: (98-110)/(59-68) 99/68 mmHg (08/09 0640) SpO2:  [100 %] 100 % (08/09 0640) Physical Exam General: lying in bed in NAD Cardiovascular: RRR, no murmurs appreciated Respiratory: CTAB, no wheezing Abdomen: soft, tender to light palpation but no tenderness upon deep palpation, non-distended Extremities: minimal pain upon passive manipulation of both legs  Neuro: A&Ox3, no focal deficits  Laboratory:  Recent Labs Lab 12/04/14 1510 12/05/14 0446 12/07/14 0535  WBC 4.2 3.9* 3.8*  HGB 12.0* 12.1* 12.5*  HCT 34.0* 34.7* 36.5*  PLT 224 229 215    Recent Labs Lab 12/01/14 2020  12/04/14 1510 12/05/14 0446 12/07/14 0535  NA  129*  < > 128* 132* 131*  K 3.4*  < > 3.5 3.8 4.3  CL 97*  < > 96* 99* 100*  CO2 20*  < > 21* 23 22  BUN 6  < > <5* 8 12  CREATININE 0.84  < > 1.11 1.02 0.86  CALCIUM 9.5  < > 9.3 9.1 9.3  PROT 6.7  --  6.5  --   --   BILITOT 0.8  --  0.7  --   --   ALKPHOS 56  --  51  --   --   ALT 14*  --  19  --   --    AST 19  --  20  --   --   GLUCOSE 114*  < > 139* 115* 110*  < > = values in this interval not displayed.   Imaging/Diagnostic Tests: No results found.   Marquette Saa, MD 12/08/2014, 1:12 PM PGY-1, [facility] Family Medicine FPTS Intern pager: [phone], text pages welcome

## 2014-12-07 NOTE — Progress Notes (Signed)
Family Medicine Teaching Service Daily Progress Note Intern Pager: [phone]  Patient name: Jimmy [name] Medical record number: [id number] Date of birth: 1965/06/01 Age: 49 y.o. Gender: male  Primary Care Provider: Lucilla Edin, MD Consultants: none Code Status: full code  Pt Overview and Major Events to Date:  8/5 - readmission for pain control 08/07 - Foley placed for urinary retention  Assessment and Plan: Jimmy [name] is a 49 y.o. male presenting with abdominal and back pain. PMH is significant for Herpes zoster ophthalmicus two months ago, history of spinal stenosis and low back pain s/p surgery.  # Lower abdominal and back pain, prostatitis: Likely related to constipation and chronic MSK pain s/p multiple back surgeries. Unlikely intra-abdominal/renal process given multiple negative CTs. Urine cultures no growth. WBC 3.8. Received Foley overnight (08/07). - Continue bactrim DS BID - Norco, gabapentin, Flexeril for pain control  - Continue pyridium - PT eval and treat - Cont Foley  # Constipation: Resolved s/p soap suds enema - Continue colace and miralax  # Weight loss & Anemia: reported history of 20lb weight loss over past several months. CXR neg. CBC remarkable for anemia when compared to June 2016 and Oct 2010 from 14>>12 range. No reported early colon cancers in family. HIV negative, TSH wnl. Could be related to recent infections and decreased PO intake. Hgb 12.5 today (08/08). - FOBT pending  - Consider outpatient colonoscopy  # T2DM: A1c 6.5 on admission. CBGs well controlled. Consider beginning metformin on d/c.  - CBG ACHS, Sensitive SSI  # Hx HZO: recent infection, s/p acyclovir  - Monitor clinically  FEN/GI: reg diet / saline lock Prophylaxis: heparin sq  Disposition: home pending medical improvement   Subjective:  Jimmy [name] reports continued pain this morning. He rates his pain as 9/10. He says it is mostly in his R lower back, thigh, and  lateral leg. He describes the pain as a constant ache with occasional shooting pain down his leg. He says no medications have worked except morphine and sometimes gabapentin. He also reports persistent muscle spasms not relieved by Flexeril.   Objective: Temp:  [97.8 F (36.6 C)-98.4 F (36.9 C)] 97.8 F (36.6 C) (08/08 1329) Pulse Rate:  [104-114] 114 (08/08 1329) Resp:  [18] 18 (08/08 1329) BP: (97-108)/(59-74) 98/59 mmHg (08/08 1329) SpO2:  [97 %-100 %] 100 % (08/08 1329) Physical Exam: General: lying in bed moaning in pain Cardiovascular: RRR, no murmurs appreciated Respiratory: CTAB, no wheezing Abdomen: soft, tender to palpation with voluntary guarding Extremities: pain upon any movement of both legs, upon palpation of hips and lower back  Neuro: A&Ox3, no focal deficits  Laboratory:  Recent Labs Lab 12/04/14 1510 12/05/14 0446 12/07/14 0535  WBC 4.2 3.9* 3.8*  HGB 12.0* 12.1* 12.5*  HCT 34.0* 34.7* 36.5*  PLT 224 229 215    Recent Labs Lab 12/01/14 2020  12/04/14 1510 12/05/14 0446 12/07/14 0535  NA 129*  < > 128* 132* 131*  K 3.4*  < > 3.5 3.8 4.3  CL 97*  < > 96* 99* 100*  CO2 20*  < > 21* 23 22  BUN 6  < > <5* 8 12  CREATININE 0.84  < > 1.11 1.02 0.86  CALCIUM 9.5  < > 9.3 9.1 9.3  PROT 6.7  --  6.5  --   --   BILITOT 0.8  --  0.7  --   --   ALKPHOS 56  --  51  --   --  ALT 14*  --  19  --   --   AST 19  --  20  --   --   GLUCOSE 114*  < > 139* 115* 110*  < > = values in this interval not displayed.   Imaging/Diagnostic Tests: Dg Chest 2 View  12/04/2014   CLINICAL DATA:  Shortness of breath, fatigue, weight loss, previous history of tobacco use.  EXAM: CHEST  2 VIEW  COMPARISON:  Chest x-ray of February 03, 2009  FINDINGS: The lungs are adequately inflated and clear. The heart and pulmonary vascularity are normal. The mediastinum is normal in width. There is no pleural effusion. The bony thorax is unremarkable.  IMPRESSION: There is no active  cardiopulmonary disease.   Electronically Signed   By: David  [place] M.D.   On: 12/04/2014 12:51     Marquette Saa, MD 12/07/2014, 3:29 PM PGY-1, [facility] Family Medicine FPTS Intern pager: [phone], text pages welcome

## 2014-12-07 NOTE — Care Management Important Message (Signed)
Important Message  Patient Details  Name: Jimmy [name] MRN: [id number] Date of Birth: Oct 13, 1965   Medicare Important Message Given:  Yes-second notification given    Yvonna Alanis 12/07/2014, 3:10 PM

## 2014-12-07 NOTE — Care Management Note (Signed)
Case Management Note  Patient Details  Name: Rishit Burkhalter MRN: [id number] Date of Birth: February 14, 1966  Subjective/Objective:   49 y.o. M admitted for continued abdominal/low back pain despite treatment for Prostatitis with Abx and insertion of Foley Cath. Complicated Pain control. Lives in private residence with spouse and should return with no discharge needs          Action/Plan:Will follow for discharge needs.   Expected Discharge Date:                  Expected Discharge Plan:  Home/Self Care  In-House Referral:     Discharge planning Services  CM Consult  Post Acute Care Choice:    Choice offered to:     DME Arranged:    DME Agency:     HH Arranged:    HH Agency:     Status of Service:  In process, will continue to follow  Medicare Important Message Given:  Yes-second notification given Date Medicare IM Given:    Medicare IM give by:    Date Additional Medicare IM Given:    Additional Medicare Important Message give by:     If discussed at Long Length of Stay Meetings, dates discussed:    Additional Comments:  Yvone Neu, RN 12/07/2014, 5:47 PM

## 2014-12-07 NOTE — Discharge Summary (Signed)
[facility] Discharge Summary  Patient name: Jimmy [name] Medical record number: [id number] Date of birth: 22-Mar-1966 Age: 49 y.o. Gender: male Date of Admission: 12/04/2014  Date of Discharge: 12/13/14 Admitting Physician: Lupita Dawn, MD  Primary Care Provider: Jenny Reichmann, MD Consultants: None  Indication for Hospitalization: abdominal and back pain  Discharge Diagnoses/Problem List:  Patient Active Problem List   Diagnosis Date Noted  . Abnormal EKG 12/12/2014  . POTS (postural orthostatic tachycardia syndrome)   . Adrenal insufficiency   . Groin pain   . Orthostatic hypotension   . Lower abdominal pain 12/04/2014  . Weight loss, unintentional   . Absolute anemia   . Prostatitis 12/03/2014  . Abdominal pain 12/02/2014  . Hypokalemia 12/02/2014  . Urinary retention 12/02/2014  . Prostatitis, acute   . Abdominal pain, lower   . Chronic lower back pain      Disposition: home  Discharge Condition: stable  Discharge Exam:  General: comfortably resting in bed in NAD  Cardiovascular: RRR, no murmurs appreciated Respiratory: CTAB, no wheezing Abdomen: soft, diffuse mild tenderness, non-distended, +BS Neuro: A&Ox3, no focal deficits  Brief Hospital Course:  Patient presented one day after discharge for hospitalization for abdominal and back pain with similar symptoms. During that hospitalization, he was diagnosed with prostatitis and started on Bactrim. He also experienced urinary retention during this previous stay.  At PCP appointment on day of admission he was found to be orthostatic, and subsequently presented to the ED.   Once admitted, Bactrim was continued for prostatitis, and Norco, gabapentin, and flexeril were started for pain. He also began to report constipation. This resolved with soap suds enema. He reported persistently unrelenting 8-10/10 pain regardless of treatment. He reported pain with both movement and touch, primarily  in his R lower back, hip, groin, and shooting pain down his R leg. He began to experience urinary retention as well. Foley was subsequently placed and he was sent home Foley and close urology follow-up.   Patient was persistently hypotensive, with orthostatic BPs as low as 50/34 upon standing. He also became leukopenic with WBC 3.8. The patient met SIRS criteria at one point (hypotension, tachycardia, source of infection, leukopenia), so MRI hip was obtained to rule out septic hip from prostatitis. MRI was negative for hip abnormalities.   Orthostatic hypotension persisted, so AM cortisol was obtained, which showed levels slightly below normal (4.5). He was subsequently started on hydrocortisone, with no effect. He was then started on Florinef and midodrine, at which point his hypotension improved.   Given the timing of his symptom onset (dizziness began after contracting viral illness thought to have triggered herpes zoster ophthalmicus), urinary retention, orthostasis and associated tachycardia, and heat intolerance (he reported dizziness when exposed to sunlight), consideration for POTS diagnosis. Other possibility is primary autonomic insufficiency.  Once his pain was under control and his BP was stable upon standing, patient was discharged with instructions for close follow-up with PCP and cardiology for further POTS workup.  Issues for Follow Up:  1. Patient reported several months of weight loss, and was found to be mildly anemic. FOBT negative. Consider outpatient colonoscopy.  2. New diagnosis of Type II DM was made. Patient did not respond well to insulin during admission, becoming very dizzy and weak after administration. He said he does not believe he has diabetes and would not take any medication upon discharge. CBG were well-controlled during latter half of admission so he was not discharged with any meds.  Recommend CBG monitoring at follow-up and potentially starting treatment  regimen. 3. Given multiple episodes of urinary retention, monitor this recurrent condition. Could be due to enlarged prostate from prostatitis, but may need further work-up if occurs again. He was also started on Flomax for this condition. Discharged on pyridium 100 mg TID with meals and 0.4 mg Flomax qd. 4. Patient was discharged with the following pain regimen: baclofen 5 mg TID PRN, PO morphine 15 mg q6 PRN, meloxicam 7.5 mg qd, effexor 25 mg BID, gabapentin 300 mg TID, Norco 5-325 mg q4 PRN, ibuprofen 400-600 mg q6 PRN, tramadol 50 mg q8 PRN. 5. Patient was discharged with Bactrim 800-160 mg BID. Should continue for 5 weeks to complete 6 week treatment course indicated for prostatitis.   6. For orthostatic hypotension, patient discharged with florinef 0.1 mg qd and midodrine 5 mg TID with meals. Will need close cardiology f/u for presumed POTS/primary autoimmune insufficiency.   Significant Procedures: None  Significant Labs and Imaging:  TSH - WNL HIV - negative  Sed rate - 30 CRP - 1.2 MRI hip - no evidence of septic hip; no abnormality to explain patient's symptoms  A1C - 6.5 Urine culture - no growth FOBT - negative AM cortisol - 4.5   Recent Labs Lab 12/08/14 1452 12/10/14 0516 12/11/14 0650  WBC 4.2 4.0 3.7*  HGB 12.7* 12.3* 12.2*  HCT 36.0* 35.2* 34.1*  PLT 216 218 179    Recent Labs Lab 12/09/14 0645 12/10/14 0516 12/11/14 0650  NA 130* 131* 130*  K 3.9 4.2 4.0  CL 97* 95* 95*  CO2 23 24 23   GLUCOSE 113* 110* 100*  BUN 10 11 12   CREATININE 0.97 0.95 0.89  CALCIUM 9.3 9.5 9.2    Results/Tests Pending at Time of Discharge: None  Discharge Medications:    Medication List    TAKE these medications        baclofen 10 MG tablet  Commonly known as:  LIORESAL  Take 0.5 tablets (5 mg total) by mouth 3 (three) times daily as needed for muscle spasms.     docusate sodium 250 MG capsule  Commonly known as:  COLACE  Take 1 capsule (250 mg total) by mouth  daily.     fludrocortisone 0.1 MG tablet  Commonly known as:  FLORINEF  Take 1 tablet (0.1 mg total) by mouth daily.     gabapentin 300 MG capsule  Commonly known as:  NEURONTIN  Take 1 capsule (300 mg total) by mouth 3 (three) times daily.     HYDROcodone-acetaminophen 5-325 MG per tablet  Commonly known as:  NORCO/VICODIN  Take 1-2 tablets by mouth every 4 (four) hours as needed.     ibuprofen 200 MG tablet  Commonly known as:  ADVIL,MOTRIN  Take 400-600 mg by mouth every 6 (six) hours as needed for moderate pain.     meloxicam 7.5 MG tablet  Commonly known as:  MOBIC  Take 1 tablet (7.5 mg total) by mouth daily.     midodrine 5 MG tablet  Commonly known as:  PROAMATINE  Take 1 tablet (5 mg total) by mouth 3 (three) times daily with meals.     morphine 15 MG tablet  Commonly known as:  MSIR  Take 1 tablet (15 mg total) by mouth every 6 (six) hours as needed for severe pain.     ondansetron 4 MG disintegrating tablet  Commonly known as:  ZOFRAN ODT  Take 1 tablet (4 mg total) by mouth  every 8 (eight) hours as needed for nausea or vomiting.     phenazopyridine 100 MG tablet  Commonly known as:  PYRIDIUM  Take 1 tablet (100 mg total) by mouth 3 (three) times daily with meals.     polyethylene glycol powder powder  Commonly known as:  GLYCOLAX/MIRALAX  Take 17 g by mouth 2 (two) times daily as needed.     sulfamethoxazole-trimethoprim 800-160 MG per tablet  Commonly known as:  BACTRIM DS,SEPTRA DS  Take 1 tablet by mouth 2 (two) times daily.     tamsulosin 0.4 MG Caps capsule  Commonly known as:  FLOMAX  Take 1 capsule (0.4 mg total) by mouth daily.     traMADol 50 MG tablet  Commonly known as:  ULTRAM  Take 1 tablet (50 mg total) by mouth every 8 (eight) hours as needed.     venlafaxine 25 MG tablet  Commonly known as:  EFFEXOR  Take 1 tablet (25 mg total) by mouth 2 (two) times daily with a meal.        Discharge Instructions: Please refer to Patient  Instructions section of EMR for full details.  Patient was counseled important signs and symptoms that should prompt return to medical care, changes in medications, dietary instructions, activity restrictions, and follow up appointments.   Verner Mould, MD 12/14/2014, 1:42 PM PGY-1, [place]

## 2014-12-08 ENCOUNTER — Inpatient Hospital Stay (HOSPITAL_COMMUNITY): Payer: Medicare Other

## 2014-12-08 LAB — CBC WITH DIFFERENTIAL/PLATELET
Basophils Absolute: 0 10*3/uL (ref 0.0–0.1)
Basophils Relative: 1 % (ref 0–1)
Eosinophils Absolute: 0.2 10*3/uL (ref 0.0–0.7)
Eosinophils Relative: 5 % (ref 0–5)
HCT: 36 % — ABNORMAL LOW (ref 39.0–52.0)
Hemoglobin: 12.7 g/dL — ABNORMAL LOW (ref 13.0–17.0)
Lymphocytes Relative: 31 % (ref 12–46)
Lymphs Abs: 1.3 10*3/uL (ref 0.7–4.0)
MCH: 34.4 pg — ABNORMAL HIGH (ref 26.0–34.0)
MCHC: 35.3 g/dL (ref 30.0–36.0)
MCV: 97.6 fL (ref 78.0–100.0)
Monocytes Absolute: 0.3 10*3/uL (ref 0.1–1.0)
Monocytes Relative: 8 % (ref 3–12)
Neutro Abs: 2.3 10*3/uL (ref 1.7–7.7)
Neutrophils Relative %: 55 % (ref 43–77)
Platelets: 216 10*3/uL (ref 150–400)
RBC: 3.69 MIL/uL — ABNORMAL LOW (ref 4.22–5.81)
RDW: 12.5 % (ref 11.5–15.5)
WBC: 4.2 10*3/uL (ref 4.0–10.5)

## 2014-12-08 LAB — GLUCOSE, CAPILLARY
Glucose-Capillary: 110 mg/dL — ABNORMAL HIGH (ref 65–99)
Glucose-Capillary: 110 mg/dL — ABNORMAL HIGH (ref 65–99)
Glucose-Capillary: 112 mg/dL — ABNORMAL HIGH (ref 65–99)
Glucose-Capillary: 122 mg/dL — ABNORMAL HIGH (ref 65–99)

## 2014-12-08 LAB — HEAVY METALS, RANDOM URINE
Arsenic Random, Urine: 11 mcg/g creat (ref <51)
Creatinine Random, Urine: 51.1 mg/dL (ref 20.0–370.0)

## 2014-12-08 LAB — CORTISOL-AM, BLOOD: Cortisol - AM: 5 ug/dL — ABNORMAL LOW (ref 6.7–22.6)

## 2014-12-08 MED ORDER — VENLAFAXINE HCL 25 MG PO TABS
25.0000 mg | ORAL_TABLET | Freq: Two times a day (BID) | ORAL | Status: DC
  Administered 2014-12-08 – 2014-12-13 (×11): 25 mg via ORAL
  Filled 2014-12-08 (×15): qty 1

## 2014-12-08 MED ORDER — LORAZEPAM 2 MG/ML IJ SOLN
1.0000 mg | Freq: Once | INTRAMUSCULAR | Status: AC
  Administered 2014-12-08: 1 mg via INTRAVENOUS
  Filled 2014-12-08: qty 1

## 2014-12-08 MED ORDER — POLYETHYLENE GLYCOL 3350 17 GM/SCOOP PO POWD: 17.0000 g | Freq: Two times a day (BID) | ORAL | Status: DC

## 2014-12-08 MED ORDER — MORPHINE SULFATE 15 MG PO TABS
7.5000 mg | ORAL_TABLET | Freq: Four times a day (QID) | ORAL | Status: DC | PRN
  Administered 2014-12-08: 7.5 mg via ORAL
  Filled 2014-12-08: qty 1

## 2014-12-08 MED ORDER — BACLOFEN 10 MG PO TABS
5.0000 mg | ORAL_TABLET | Freq: Three times a day (TID) | ORAL | Status: DC | PRN
  Administered 2014-12-10 – 2014-12-11 (×2): 5 mg via ORAL
  Filled 2014-12-08: qty 1
  Filled 2014-12-08: qty 0.5
  Filled 2014-12-08: qty 1

## 2014-12-08 MED ORDER — POLYETHYLENE GLYCOL 3350 17 G PO PACK
17.0000 g | PACK | Freq: Two times a day (BID) | ORAL | Status: DC
  Administered 2014-12-08 – 2014-12-13 (×8): 17 g via ORAL
  Filled 2014-12-08 (×10): qty 1

## 2014-12-08 MED ORDER — MELOXICAM 7.5 MG PO TABS
7.5000 mg | ORAL_TABLET | Freq: Every day | ORAL | Status: DC
  Administered 2014-12-08 – 2014-12-13 (×6): 7.5 mg via ORAL
  Filled 2014-12-08 (×7): qty 1

## 2014-12-08 MED ORDER — MORPHINE SULFATE 15 MG PO TABS
15.0000 mg | ORAL_TABLET | Freq: Four times a day (QID) | ORAL | Status: DC | PRN
  Administered 2014-12-08 – 2014-12-13 (×18): 15 mg via ORAL
  Filled 2014-12-08 (×18): qty 1

## 2014-12-08 MED ORDER — BOOST / RESOURCE BREEZE PO LIQD
1.0000 | Freq: Two times a day (BID) | ORAL | Status: DC
  Administered 2014-12-08 – 2014-12-09 (×2): 1 via ORAL

## 2014-12-08 MED ORDER — GADOBENATE DIMEGLUMINE 529 MG/ML IV SOLN
18.0000 mL | Freq: Once | INTRAVENOUS | Status: AC | PRN
  Administered 2014-12-08: 18 mL via INTRAVENOUS

## 2014-12-08 MED ORDER — TAMSULOSIN HCL 0.4 MG PO CAPS
0.4000 mg | ORAL_CAPSULE | Freq: Every day | ORAL | Status: DC
  Administered 2014-12-08 – 2014-12-13 (×6): 0.4 mg via ORAL
  Filled 2014-12-08 (×7): qty 1

## 2014-12-08 NOTE — Progress Notes (Signed)
PT Cancellation Note  Patient Details Name: Jimmy [name] MRN: [id number] DOB: 1966/02/27   Cancelled Treatment:    Reason Eval/Treat Not Completed: Other (comment).  Pt politely refusing to participate with PT right now. He reports significant orthostatic hypotension when attempting to get OOB this AM (had the BPs written down).  He also reports he is trying to transition from IV to oral morphine. He reports that when I saw him on (12/05/14) and signed off he had his IV morphine and was feeling good (he was up walking around the room independently).  PT will attempt eval later today (left my pager with pt if he felt better) or tomorrow.   Thanks,    Rollene Rotunda. [name], PT, DPT [phone]   12/08/2014, 12:27 PM

## 2014-12-09 DIAGNOSIS — R1030 Lower abdominal pain, unspecified: Secondary | ICD-10-CM | POA: Diagnosis present

## 2014-12-09 LAB — GLUCOSE, CAPILLARY
Glucose-Capillary: 110 mg/dL — ABNORMAL HIGH (ref 65–99)
Glucose-Capillary: 130 mg/dL — ABNORMAL HIGH (ref 65–99)
Glucose-Capillary: 123 mg/dL — ABNORMAL HIGH (ref 65–99)
Glucose-Capillary: 126 mg/dL — ABNORMAL HIGH (ref 65–99)

## 2014-12-09 LAB — BASIC METABOLIC PANEL
Anion gap: 10 (ref 5–15)
BUN: 10 mg/dL (ref 6–20)
CO2: 23 mmol/L (ref 22–32)
Calcium: 9.3 mg/dL (ref 8.9–10.3)
Chloride: 97 mmol/L — ABNORMAL LOW (ref 101–111)
Creatinine, Ser: 0.97 mg/dL (ref 0.61–1.24)
GFR calc Af Amer: >60 mL/min (ref >60)
GFR calc non Af Amer: >60 mL/min (ref >60)
Glucose, Bld: 113 mg/dL — ABNORMAL HIGH (ref 65–99)
Potassium: 3.9 mmol/L (ref 3.5–5.1)
Sodium: 130 mmol/L — ABNORMAL LOW (ref 135–145)

## 2014-12-09 LAB — OCCULT BLOOD X 1 CARD TO LAB, STOOL: Fecal Occult Bld: NEGATIVE

## 2014-12-09 NOTE — Progress Notes (Signed)
   12/02/14 1621  PT Time Calculation  PT Start Time (ACUTE ONLY) 1607  PT Stop Time (ACUTE ONLY) 1617  PT Time Calculation (min) (ACUTE ONLY) 10 min  PT G-Codes **NOT FOR INPATIENT CLASS**  Functional Assessment Tool Used Clinical judgement  Functional Limitation Mobility: Walking and moving around  Mobility: Walking and Moving Around Current Status (Q2595) CI  Mobility: Walking and Moving Around Goal Status (G3875) CH  PT General Charges  $$ ACUTE PT VISIT 1 Procedure  PT Evaluation  $Initial PT Evaluation Tier I 1 Procedure

## 2014-12-09 NOTE — Progress Notes (Signed)
Family Medicine Teaching Service Daily Progress Note Intern Pager: [phone]  Patient name: Jimmy [name] Medical record number: [id number] Date of birth: 03-Feb-1966 Age: 49 y.o. Gender: male  Primary Care Provider: Lucilla Edin, MD Consultants: None Code Status: FULL code  Pt Overview and Major Events to Date:  08/05 - readmission for pain control 08/07 - Foley placed for urinary retention 08/09 - MRI hip negative for septic hip  Assessment and Plan: Jimmy [name] is a 49 y.o. male presenting with abdominal and back pain. PMH is significant for Herpes zoster ophthalmicus two months ago, history of spinal stenosis and low back pain s/p surgery.  # R hip and back pain: potentially related to chronic MSK pain s/p multiple back surgeries. Patient now describing pain in sciatic distribution. With persistent R hip pain, concern for seeding to hip from prostatitis, so obtained hip MRI.  - Gabapentin 300 mg TID, meloxicam, PO morphine, Effexor for pain control - Baclofen TID for back spasms - Discontinue pyridium - PT eval and treat - patient refused yesterday, will try again today  - MRI hip - no abnormalities - BMP - slight hyponatremia, monitor   # Orthostatic hypotension: Decreased to 65/35 with HR 143 upon standing yesterday (08/09). Patient reports associated severe dizziness with standing. AM cortisol mistakenly drawn last night rather than this morning.  - Up with assistance only  - Repeat orthostatics pending  - Add TED hose   # Leukopenia: Concern for sepsis given tachycardia, hypotension, known infective source (prostatitis). WBC now WNL at 4.2. - Monitor - Consider changing abx if becomes leukopenic again/other sepsis criteria persist   # Prostatitis: Discovered on prior admission last week. Urine cultures no growth. CRP and sed rate both elevated. WBC 4.2 (08/09). Currently treated with Bactrim. Concern for inadequate treatment given leukopenia, but that has improved  today. - Monitor WBC - Cont Bactrim - D/c pyridium  - If WBC drops again, consider switching from Bactrim to IV abx - MRI shows no evidence of septic hip  # Urinary retention: Occurred during prior admission last week as well. Foley placed on 08/07. May be due to enlarged prostate from prostatitis.  - Voiding trial today (08/10) - Resume Flomax  # Constipation: Resolved s/p soap suds enema - Continue colace, Miralax to scheduled BID   # Weight loss & Anemia: reported history of 20lb weight loss over past several months. CXR neg. CBC remarkable for anemia when compared to June 2016 and Oct 2010 from 14>>12 range. No reported early colon cancers in family. HIV negative, TSH WNL. Could be related to recent infections and decreased PO intake. Hgb 12.5. - Consider outpatient colonoscopy  # T2DM: A1c 6.5 on admission. CBGs well controlled. Consider beginning metformin on d/c. Patient had episode of dizziness and weakness after insulin administration so insulin d/ced. CBGs average low 100s in past 24 hrs. - CBG ACHS  # Hx HZO: recent infection, s/p acyclovir  - Monitor clinically  FEN/GI: carb-modified diet/ saline lock Prophylaxis: heparin sq  Disposition: home pending medical improvement   Subjective:  Jimmy [name] reports that his pain has improved to a 4 now. He was resting comfortably when I entered the room. He says the pain is still in his R hip, lower back, and leg, but he is able to move more easily now. He states he is most concerned about his hypotension and the dizziness he experiences when standing up.   Objective: Temp:  [97.6 F (36.4 C)-98 F (36.7 C)] 98 F (  36.7 C) (08/10 1039) Pulse Rate:  [94-116] 94 (08/10 0546) Resp:  [18] 18 (08/10 1039) BP: (105-106)/(67-69) 105/69 mmHg (08/10 0546) SpO2:  [96 %-100 %] 100 % (08/10 1039) Physical Exam: General: sleeping but easily aroused when I entered the room in NAD Cardiovascular: RRR, no murmurs  appreciated Respiratory: CTAB, no wheezing Abdomen: soft, TTP, non-distended, +BS Extremities: minimal pain upon passive manipulation of both legs  Neuro: A&Ox3, no focal deficits  Laboratory:  Recent Labs Lab 12/05/14 0446 12/07/14 0535 12/08/14 1452  WBC 3.9* 3.8* 4.2  HGB 12.1* 12.5* 12.7*  HCT 34.7* 36.5* 36.0*  PLT 229 215 216    Recent Labs Lab 12/04/14 1510 12/05/14 0446 12/07/14 0535 12/09/14 0645  NA 128* 132* 131* 130*  K 3.5 3.8 4.3 3.9  CL 96* 99* 100* 97*  CO2 21* 23 22 23   BUN <5* 8 12 10   CREATININE 1.11 1.02 0.86 0.97  CALCIUM 9.3 9.1 9.3 9.3  PROT 6.5  --   --   --   BILITOT 0.7  --   --   --   ALKPHOS 51  --   --   --   ALT 19  --   --   --   AST 20  --   --   --   GLUCOSE 139* 115* 110* 113*     Imaging/Diagnostic Tests: Mr Hip Right W Wo Contrast  12/09/2014   CLINICAL DATA:  Back and right hip pain. History of prostatitis. Question hip infection. Subsequent encounter.  EXAM: MRI OF THE RIGHT HIP WITHOUT AND WITH CONTRAST  TECHNIQUE: Multiplanar, multisequence MR imaging was performed both before and after administration of intravenous contrast.  CONTRAST:  18 mL MULTIHANCE GADOBENATE DIMEGLUMINE 529 MG/ML IV SOLN  COMPARISON:  CT abdomen and pelvis 12/02/2014. Lumbar spine MRI 11/30/2014.  FINDINGS: Bones: Bone marrow signal is normal in both hips, throughout the pelvis and visualized femurs. Mild degenerative endplate signal change is noted at L4-5 as seen on the comparison lumbar spine MRI.  Articular cartilage and labrum  Articular cartilage:  Appears normal.  Labrum:  Intact.  Joint or bursal effusion  Joint effusion:  None.  Bursae:  Unremarkable.  Muscles and tendons  Muscles and tendons:  Intact.  Other findings  Miscellaneous:  None.  IMPRESSION: Negative examination. There is no evidence of septic hip. No abnormality to explain the patient's symptoms is identified.   Electronically Signed   By: Drusilla Kanner M.D.   On: 12/09/2014 07:57      Donnalee Cellucci Shelbie Hutching, MD 12/09/2014, 12:16 PM PGY-1, [facility] FPTS Intern pager: [phone], text pages welcome

## 2014-12-09 NOTE — Evaluation (Signed)
Physical Therapy Evaluation Patient Details Name: Jimmy [name] MRN: [id number] DOB: 1965/08/19 Today's Date: 12/09/2014   History of Present Illness  49 y.o. male admitted to [facility] on 12/04/14 with abdominal pain and back pain and orthostatic hypotension.  Pt with significant PMHx of back pain and back surgery, and herpes zoster opthalmicus.    Clinical Impression  Pt is symptomatic on his feet, reporting dizziness and weakness.  Seating and standing BPs taken (see vitals below).  Pt has an interesting physical presentation when he is standing to take standing BP.  His eyes never roll back in his head, he is talking the entire time, and instead of going limp at the knees he throws himself (extends) back into the chair.  I spoke with RN and recommended possibly taking a manual BP to check the accuracy of the automatic cuffs.  Most of the pressures have been taken in his left arm as well.  PT will continue to follow acutely to help progress gait and mobility, but his physical issues are likely related to his low BP.      Follow Up Recommendations No PT follow up;Other (comment) (as his issue is related to his BP)    Equipment Recommendations  Rolling walker with 5" wheels    Recommendations for Other Services   NA    Precautions / Restrictions Precautions Precautions: Fall      Mobility  Bed Mobility               General bed mobility comments: pt seated EOB when PT entered the room.   Transfers Overall transfer level: Needs assistance Equipment used: Rolling walker (2 wheeled) Transfers: Sit to/from Stand Sit to Stand: Min guard         General transfer comment: min guard assist to steady pt during transitions as he immediately gets symptomatic in standing.   Ambulation/Gait Ambulation/Gait assistance: Min assist Ambulation Distance (Feet): 10 Feet (with one seated rest break) Assistive device: Rolling walker (2 wheeled)   Gait velocity: decreased Gait velocity  interpretation: Below normal speed for age/gender General Gait Details: pt wtih generally weak gait patttern, reporting lightheadedness throughout.  Using RW for support as he feels weaker the longer he is on his feet.          Balance Overall balance assessment: Needs assistance Sitting-balance support: Feet supported;No upper extremity supported Sitting balance-Leahy Scale: Good     Standing balance support: Single extremity supported;Bilateral upper extremity supported Standing balance-Leahy Scale: Poor                               Pertinent Vitals/Pain Pain Assessment: No/denies pain (he has been medicated with oral morphine)    12/09/14 1509  Vital Signs  BP Location Left Arm  Orthostatic Sitting  BP- Sitting 115/68 mmHg  Pulse- Sitting 114  Orthostatic Standing at 0 minutes  BP- Standing at 0 minutes (!) 50/34 mmHg  Pulse- Standing at 0 minutes 84    Home Living Family/patient expects to be discharged to:: Private residence Living Arrangements: Spouse/significant other Available Help at Discharge: Family;Available 24 hours/day Type of Home: House Home Access: Stairs to enter Entrance Stairs-Rails: Doctor, general practice of Steps: 2 Home Layout: One level        Prior Function Level of Independence: Independent                  Extremity/Trunk Assessment   Upper Extremity Assessment: Overall [facility]  for tasks assessed           Lower Extremity Assessment: Generalized weakness (but only when he starts to feel dizzy)      Cervical / Trunk Assessment: Other exceptions  Communication   Communication: No difficulties  Cognition Arousal/Alertness: Awake/alert Behavior During Therapy: WFL for tasks assessed/performed Overall Cognitive Status: Within Functional Limits for tasks assessed                      General Comments General comments (skin integrity, edema, etc.): Took seated and standing BPs. Pt was already  seated EOB upon entering room, no supine BP taken.           Assessment/Plan    PT Assessment Patient needs continued PT services  PT Diagnosis Difficulty walking;Abnormality of gait;Generalized weakness;Acute pain   PT Problem List Decreased strength;Decreased activity tolerance;Decreased mobility;Decreased balance;Decreased knowledge of use of DME;Pain  PT Treatment Interventions DME instruction;Gait training;Stair training;Functional mobility training;Therapeutic activities;Therapeutic exercise;Balance training;Neuromuscular re-education;Patient/family education   PT Goals (Current goals can be found in the Care Plan section) Acute Rehab PT Goals Patient Stated Goal: "to figure out what is wrong with me" PT Goal Formulation: With patient/family Time For Goal Achievement: 12/23/14 Potential to Achieve Goals: Good    Frequency Min 3X/week           End of Session Equipment Utilized During Treatment: Gait belt Activity Tolerance: Patient limited by fatigue Patient left: in chair;with call bell/phone within reach;with family/visitor present Nurse Communication: Mobility status         Time: 1610-9604 PT Time Calculation (min) (ACUTE ONLY): 20 min   Charges:   PT Evaluation $Initial PT Evaluation Tier I: 1 Procedure          Jayde Mcallister B. Shyanna Klingel, PT, DPT [phone]   12/09/2014, 3:35 PM

## 2014-12-09 NOTE — Progress Notes (Signed)
Family Medicine Teaching Service Daily Progress Note Intern Pager: [phone]  Patient name: Jimmy [name] Medical record number: [id number] Date of birth: 09/06/1965 Age: 49 y.o. Gender: male  Primary Care Provider: Lucilla Edin, MD Consultants: None Code Status: FULL code  Pt Overview and Major Events to Date:  08/05 - readmission for pain control 08/07 - Foley placed for urinary retention 08/09 - MRI hip negative for septic hip  Assessment and Plan: Jimmy [name] is a 49 y.o. male presenting with abdominal and back pain. PMH is significant for Herpes zoster ophthalmicus two months ago, history of spinal stenosis and low back pain s/p surgery.  # R hip and back pain: potentially related to chronic MSK pain s/p multiple back surgeries. Patient now describing pain in sciatic distribution. Hip MRI - no abnormalities.  PT recommends rolling walker; signed off.  - Gabapentin 300 mg TID, meloxicam, PO morphine, Effexor for pain control - Baclofen TID for back spasms   # Orthostatic hypotension: Lowest BP 50/34 on standing. Patient still reporting severe dizziness with standing. AM cortisol slightly decreased at 4.5. - Up with assistance only  - Repeat orthostatics q4 - Begin hydrocortisone 5 mg TID - Monitor orthostatics throughout afternoon, can d/c if clinically improved   # Leukopenia: Concern for sepsis given tachycardia, hypotension, known infective source (prostatitis). WBC now WNL at 4.2. - Monitor - Consider changing abx if becomes leukopenic again/other sepsis criteria persist   # Prostatitis: Discovered on prior admission last week. Urine cultures no growth. CRP and sed rate both elevated. WBC 4.2 (08/09). Currently treated with Bactrim.  - Cont Bactrim - If WBC drops again, consider switching from Bactrim to IV abx  # Urinary retention: Occurred during prior admission last week as well. Foley initially placed on 08/07. May be due to enlarged prostate from prostatitis.  Postvoid residual volume was >500 cc after voiding trial. Foley reinserted.  - Resume Flomax - D/c with Foley and urology outpatient follow-up  # Constipation: Resolved s/p soap suds enema - Continue colace, Miralax scheduled BID   # Weight loss & Anemia: reported history of 20 lb weight loss over past several months. CXR neg. CBC remarkable for anemia when compared to June 2016 and Oct 2010 from 14>>12 range. No reported early colon cancers in family. HIV negative, TSH WNL. Could be related to recent infections and decreased PO intake. Hgb 12.5. FOBT negative.  - Consider outpatient colonoscopy  # T2DM: A1c 6.5 on admission. CBGs well controlled. Consider beginning metformin on d/c. Patient had episode of dizziness and weakness after insulin administration so insulin d/ced. CBGs average low 120s in past 24 hrs. - CBG ACHS  # Hx HZO: recent infection, s/p acyclovir  - Monitor clinically  FEN/GI: carb-modified diet/ saline lock Prophylaxis: heparin sq  Disposition: home pending medical improvement   Subjective:  Jimmy [name] reports that his pain is improved today but he still feels dizzy when he stands. He is still very concerned about his hypotension and does not think he should go home today.   Objective: Temp:  [97.8 F (36.6 C)-98.1 F (36.7 C)] 98 F (36.7 C) (08/11 0538) Pulse Rate:  [94-103] 94 (08/11 0538) Resp:  [18] 18 (08/11 0538) BP: (101-106)/(66-71) 106/66 mmHg (08/11 0538) SpO2:  [97 %-99 %] 97 % (08/11 0538) Physical Exam: General: resting in bed in NAD  Cardiovascular: RRR, no murmurs appreciated Respiratory: CTAB, no wheezing Abdomen: soft, TTP, non-distended, +BS Extremities: minimal pain upon passive manipulation of both legs  Neuro:  A&Ox3, no focal deficits  Laboratory:  Recent Labs Lab 12/07/14 0535 12/08/14 1452 12/10/14 0516  WBC 3.8* 4.2 4.0  HGB 12.5* 12.7* 12.3*  HCT 36.5* 36.0* 35.2*  PLT 215 216 218    Recent Labs Lab  12/04/14 1510  12/07/14 0535 12/09/14 0645 12/10/14 0516  NA 128*  < > 131* 130* 131*  K 3.5  < > 4.3 3.9 4.2  CL 96*  < > 100* 97* 95*  CO2 21*  < > BUN <5*  < > CREATININE 1.11  < > 0.86 0.97 0.95  CALCIUM 9.3  < > 9.3 9.3 9.5  PROT 6.5  --   --   --   --   BILITOT 0.7  --   --   --   --   ALKPHOS 51  --   --   --   --   ALT 19  --   --   --   --   AST 20  --   --   --   --   GLUCOSE 139*  < > 110* 113* 110*  < > = values in this interval not displayed.   Imaging/Diagnostic Tests: Mr Hip Right W Wo Contrast  12/09/2014   CLINICAL DATA:  Back and right hip pain. History of prostatitis. Question hip infection. Subsequent encounter.  EXAM: MRI OF THE RIGHT HIP WITHOUT AND WITH CONTRAST  TECHNIQUE: Multiplanar, multisequence MR imaging was performed both before and after administration of intravenous contrast.  CONTRAST:  18 mL MULTIHANCE GADOBENATE DIMEGLUMINE 529 MG/ML IV SOLN  COMPARISON:  CT abdomen and pelvis 12/02/2014. Lumbar spine MRI 11/30/2014.  FINDINGS: Bones: Bone marrow signal is normal in both hips, throughout the pelvis and visualized femurs. Mild degenerative endplate signal change is noted at L4-5 as seen on the comparison lumbar spine MRI.  Articular cartilage and labrum  Articular cartilage:  Appears normal.  Labrum:  Intact.  Joint or bursal effusion  Joint effusion:  None.  Bursae:  Unremarkable.  Muscles and tendons  Muscles and tendons:  Intact.  Other findings  Miscellaneous:  None.  IMPRESSION: Negative examination. There is no evidence of septic hip. No abnormality to explain the patient's symptoms is identified.   Electronically Signed   By: Drusilla Kanner M.D.   On: 12/09/2014 07:57     Dore Oquin Shelbie Hutching, MD 12/10/2014, 11:48 AM PGY-1,  Family Medicine FPTS Intern pager: [phone], text pages welcome

## 2014-12-10 DIAGNOSIS — E2740 Unspecified adrenocortical insufficiency: Secondary | ICD-10-CM

## 2014-12-10 LAB — BASIC METABOLIC PANEL
Anion gap: 12 (ref 5–15)
BUN: 11 mg/dL (ref 6–20)
CO2: 24 mmol/L (ref 22–32)
Calcium: 9.5 mg/dL (ref 8.9–10.3)
Chloride: 95 mmol/L — ABNORMAL LOW (ref 101–111)
Creatinine, Ser: 0.95 mg/dL (ref 0.61–1.24)
GFR calc Af Amer: >60 mL/min (ref >60)
GFR calc non Af Amer: >60 mL/min (ref >60)
Glucose, Bld: 110 mg/dL — ABNORMAL HIGH (ref 65–99)
Potassium: 4.2 mmol/L (ref 3.5–5.1)
Sodium: 131 mmol/L — ABNORMAL LOW (ref 135–145)

## 2014-12-10 LAB — CORTISOL-AM, BLOOD: Cortisol - AM: 4.5 ug/dL — ABNORMAL LOW (ref 6.7–22.6)

## 2014-12-10 LAB — GLUCOSE, CAPILLARY
Glucose-Capillary: 117 mg/dL — ABNORMAL HIGH (ref 65–99)
Glucose-Capillary: 116 mg/dL — ABNORMAL HIGH (ref 65–99)
Glucose-Capillary: 131 mg/dL — ABNORMAL HIGH (ref 65–99)
Glucose-Capillary: 111 mg/dL — ABNORMAL HIGH (ref 65–99)

## 2014-12-10 LAB — CBC
HCT: 35.2 % — ABNORMAL LOW (ref 39.0–52.0)
Hemoglobin: 12.3 g/dL — ABNORMAL LOW (ref 13.0–17.0)
MCH: 33.5 pg (ref 26.0–34.0)
MCHC: 34.9 g/dL (ref 30.0–36.0)
MCV: 95.9 fL (ref 78.0–100.0)
Platelets: 218 10*3/uL (ref 150–400)
RBC: 3.67 MIL/uL — ABNORMAL LOW (ref 4.22–5.81)
RDW: 12.3 % (ref 11.5–15.5)
WBC: 4 10*3/uL (ref 4.0–10.5)

## 2014-12-10 MED ORDER — HYDROCORTISONE 5 MG PO TABS
5.0000 mg | ORAL_TABLET | Freq: Three times a day (TID) | ORAL | Status: AC
  Administered 2014-12-10 (×2): 5 mg via ORAL
  Filled 2014-12-10 (×2): qty 1

## 2014-12-10 MED ORDER — HYDROCORTISONE 5 MG PO TABS
5.0000 mg | ORAL_TABLET | Freq: Three times a day (TID) | ORAL | Status: DC
  Administered 2014-12-10: 5 mg via ORAL
  Filled 2014-12-10 (×3): qty 1

## 2014-12-10 MED ORDER — PREDNISONE 5 MG PO TABS
5.0000 mg | ORAL_TABLET | Freq: Every day | ORAL | Status: DC
  Administered 2014-12-11: 5 mg via ORAL
  Filled 2014-12-10 (×2): qty 1

## 2014-12-10 NOTE — Care Management Important Message (Signed)
Important Message  Patient Details  Name: Jimmy [name] MRN: [id number] Date of Birth: [birth year]   Medicare Important Message Given:  Yes-third notification given    Kyla Balzarine 12/10/2014, 3:01 PMImportant Message  Patient Details  Name: Jimmy [name] MRN: [id number] Date of Birth: 08/08/1965   Medicare Important Message Given:  Yes-third notification given    Kyla Balzarine 12/10/2014, 3:00 PM

## 2014-12-10 NOTE — Progress Notes (Signed)
Pt states no complaints. Family at bedside. Resting-watching TV

## 2014-12-10 NOTE — Progress Notes (Signed)
Nutrition Follow-up  DOCUMENTATION CODES:   Not applicable  INTERVENTION:   -D/c Boost Breeze po BID, each supplement provides 250 kcal and 9 grams of protein -Continue Carb Modified diet  NUTRITION DIAGNOSIS:   Inadequate oral intake related to poor appetite as evidenced by per patient/family report.  Progressing  GOAL:   Patient will meet greater than or equal to 90% of their needs  Met  MONITOR:   PO intake, Labs, Weight trends, Skin, I & O's  REASON FOR ASSESSMENT:   Malnutrition Screening Tool    ASSESSMENT:   49 year old African-American male with past history of spinal stenosis status post low back surgery 2, herpes zoster ophthalmicus, and chronic back pain. Patient was discharged from [facility] on 12/03/2014 with prostatitis. Patient was discharged on Bactrim has been taking this as prescribed. He was seen today by his PCP and was noted to have increased back and abdominal pain. The low back pain is primarily in the right paraspinal lumbar area, he does report radicular symptoms in the posterior right leg, no bladder or bowel incontinence, he reports mild urinary hesitancy initially and then has good flow, abdominal pain is located bilateral lower quadrant as well as epigastric, no associated nausea or vomiting, last BM was 5 days ago, please refer to resident note for additional history present illness.  Pt reports improving appetite. He reveals he consumed about 50% of his breakfast, due to being served "the wrong thing". Intake has been variable; PO: 5-100%.   Pt wife reports that she feels pt's weight loss was likely due to pain; control has improved significantly since last admission. She has observed him eating more this hospitalization. He does not like the Ensure or Boost Breeze supplements. Offered other options on formulary, but pt politely declined and requested to d/c current supplement order.  Discussed importance of good PO intake to support  healing. Pt and wife deny any further nutrition-related questions or concerns, however, express appreciation for visit.   Labs reviewed: Na: 131 (on IV supplementation).   Diet Order:  Diet Carb Modified Fluid consistency:: Thin; Room service appropriate?: Yes  Skin:  Reviewed, no issues  Last BM:  12/09/14  Height:   Ht Readings from Last 1 Encounters:  12/04/14 5' 11"  (1.803 m)    Weight:   Wt Readings from Last 1 Encounters:  12/04/14 198 lb 3.2 oz (89.903 kg)    Ideal Body Weight:  78.2 kg  BMI:  Body mass index is 27.66 kg/(m^2).  Estimated Nutritional Needs:   Kcal:  2000-2200  Protein:  100-110 grams  Fluid:  2.0-2.2 L  EDUCATION NEEDS:   No education needs identified at this time  Myisha Pickerel A. Jimmye Norman, RD, LDN, CDE Pager: [phone] After hours Pager: [phone]

## 2014-12-11 DIAGNOSIS — I498 Other specified cardiac arrhythmias: Secondary | ICD-10-CM | POA: Diagnosis present

## 2014-12-11 DIAGNOSIS — G90A Postural orthostatic tachycardia syndrome (POTS): Secondary | ICD-10-CM | POA: Diagnosis present

## 2014-12-11 DIAGNOSIS — I951 Orthostatic hypotension: Secondary | ICD-10-CM

## 2014-12-11 DIAGNOSIS — R000 Tachycardia, unspecified: Secondary | ICD-10-CM

## 2014-12-11 LAB — OPIATES/OPIOIDS (LC/MS-MS)
Codeine Urine: NEGATIVE ng/mL (ref <50)
Hydrocodone: NEGATIVE ng/mL (ref <50)
Hydromorphone: NEGATIVE ng/mL (ref <50)
Morphine Urine: 50 ng/mL — AB (ref <50)
Norhydrocodone, Ur: 283 ng/mL — AB (ref <50)
Noroxycodone, Ur: NEGATIVE ng/mL (ref <50)
Oxycodone, ur: NEGATIVE ng/mL (ref <50)
Oxymorphone: NEGATIVE ng/mL (ref <50)

## 2014-12-11 LAB — CBC
HCT: 34.1 % — ABNORMAL LOW (ref 39.0–52.0)
Hemoglobin: 12.2 g/dL — ABNORMAL LOW (ref 13.0–17.0)
MCH: 34 pg (ref 26.0–34.0)
MCHC: 35.8 g/dL (ref 30.0–36.0)
MCV: 95 fL (ref 78.0–100.0)
Platelets: 179 10*3/uL (ref 150–400)
RBC: 3.59 MIL/uL — ABNORMAL LOW (ref 4.22–5.81)
RDW: 12.2 % (ref 11.5–15.5)
WBC: 3.7 10*3/uL — ABNORMAL LOW (ref 4.0–10.5)

## 2014-12-11 LAB — BASIC METABOLIC PANEL
Anion gap: 12 (ref 5–15)
BUN: 12 mg/dL (ref 6–20)
CO2: 23 mmol/L (ref 22–32)
Calcium: 9.2 mg/dL (ref 8.9–10.3)
Chloride: 95 mmol/L — ABNORMAL LOW (ref 101–111)
Creatinine, Ser: 0.89 mg/dL (ref 0.61–1.24)
GFR calc Af Amer: >60 mL/min (ref >60)
GFR calc non Af Amer: >60 mL/min (ref >60)
Glucose, Bld: 100 mg/dL — ABNORMAL HIGH (ref 65–99)
Potassium: 4 mmol/L (ref 3.5–5.1)
Sodium: 130 mmol/L — ABNORMAL LOW (ref 135–145)

## 2014-12-11 LAB — TROPONIN I: Troponin I: <0.03 ng/mL (ref <0.031)

## 2014-12-11 LAB — GLUCOSE, CAPILLARY
Glucose-Capillary: 97 mg/dL (ref 65–99)
Glucose-Capillary: 118 mg/dL — ABNORMAL HIGH (ref 65–99)
Glucose-Capillary: 126 mg/dL — ABNORMAL HIGH (ref 65–99)
Glucose-Capillary: 107 mg/dL — ABNORMAL HIGH (ref 65–99)

## 2014-12-11 MED ORDER — FLUDROCORTISONE ACETATE 0.1 MG PO TABS
0.1000 mg | ORAL_TABLET | Freq: Every day | ORAL | Status: DC
  Administered 2014-12-11 – 2014-12-13 (×3): 0.1 mg via ORAL
  Filled 2014-12-11 (×4): qty 1

## 2014-12-11 MED ORDER — SODIUM CHLORIDE 1 G PO TABS
1.0000 g | ORAL_TABLET | Freq: Two times a day (BID) | ORAL | Status: DC
  Administered 2014-12-11 – 2014-12-13 (×6): 1 g via ORAL
  Filled 2014-12-11 (×8): qty 1

## 2014-12-11 MED ORDER — GABAPENTIN 300 MG PO CAPS
300.0000 mg | ORAL_CAPSULE | Freq: Three times a day (TID) | ORAL | Status: DC
  Administered 2014-12-11 – 2014-12-13 (×7): 300 mg via ORAL
  Filled 2014-12-11 (×7): qty 1

## 2014-12-11 MED ORDER — MIDODRINE HCL 5 MG PO TABS
5.0000 mg | ORAL_TABLET | Freq: Three times a day (TID) | ORAL | Status: DC
  Administered 2014-12-12 – 2014-12-13 (×6): 5 mg via ORAL
  Filled 2014-12-11 (×7): qty 1

## 2014-12-11 MED ORDER — SODIUM CHLORIDE 0.9 % IV BOLUS (SEPSIS)
1000.0000 mL | Freq: Once | INTRAVENOUS | Status: AC
  Administered 2014-12-11: 1000 mL via INTRAVENOUS

## 2014-12-11 NOTE — Progress Notes (Addendum)
FPTS Interim Progress Note  Patient still hypotensive despite beginning hydrocortisone. Was only able to stand once today for orthostatic measurements, but had to sit down almost immediately as he reported severe side pain at that time that he later attributed to a muscle spasm. Regardless, he is hypotensive and tachycardic even when sitting.   Given timing of symptom onset (reports dizziness began recently when he had HZO), orthostatic hypotension with associated tachycardia, urinary retention, and heat intolerance, patient's symptoms are consistent with POTS. When hypoTN controlled and pt discharged, will need close outpatient follow-up for this.   Spoke with cardiology about treatment regimen. They agreed with our decision to begin fludrocortisone in place of hydrocortisone, but suggested adding midodrine as well.   O: BP 112/63 mmHg  Pulse 108  Temp(Src) 98.2 F (36.8 C) (Oral)  Resp 18  Ht  (1.803 m)  Wt 198 lb 3.2 oz (89.903 kg)  BMI 27.66 kg/m2  SpO2 96%    A/P: Orthostatic hypotension: patient not improving with hydrocortisone. Symptoms consistent with POTS. - D/c hydrocortisone - Begin fludrocortisone today (08/12); begin midodrine in AM (08/13 at 6 AM) unless significant improvement on fludrocortisone alone  Marquette Saa, MD 12/11/2014, 3:22 PM PGY-1, [facility] Family Medicine Service pager [phone]

## 2014-12-11 NOTE — Progress Notes (Signed)
Physical Therapy Treatment Patient Details Name: Jimmy [name] MRN: [id number] DOB: [birth year] Today's Date: 12/11/2014    History of Present Illness 49 y.o. male admitted to [facility] on 12/04/14 with abdominal pain and back pain and orthostatic hypotension.  Pt with significant PMHx of back pain and back surgery, and herpes zoster opthalmicus.      PT Comments    Pt is limited today by increased pain and continued low and symptomatic BPs.  Only able to get supine and sitting orthostatics today as pt was hardly able to tolerate sitting.  Wife continues to get him up OOB once daily.  Pt give both in bed and seated HEP to help try to prevent significant deconditioning. He doesn't need any f/u PT right now, but if he continues to be mostly in the bed he may before he is discharged.   Follow Up Recommendations  No PT follow up;Other (comment) (at this time as his issue is related to BPs)     Equipment Recommendations  Rolling walker with 5" wheels    Recommendations for Other Services   NA     Precautions / Restrictions Precautions Precautions: Fall (due to orthostatics) Precaution Comments: chronic low back pain    Mobility  Bed Mobility Overal bed mobility: Modified Independent             General bed mobility comments: Pt able to move in the bed and get to EOB with the support of the railing.   Transfers                 General transfer comment: not tested due to significant lightheadedness in sitting         Balance Overall balance assessment: Needs assistance Sitting-balance support: Feet supported;Single extremity supported Sitting balance-Leahy Scale: Fair                              Cognition Arousal/Alertness: Awake/alert Behavior During Therapy: WFL for tasks assessed/performed Overall Cognitive Status: Within Functional Limits for tasks assessed                      Exercises Other Exercises Other Exercises: HEP given of both  in bed and seated exercises to help with deconditioning from being in bed.   Given and verbally reviewed with his wife.         Pertinent Vitals/Pain Pain Assessment: Faces Faces Pain Scale: Hurts worst Pain Location: low back, abdomen, right leg, and after session right flank Pain Descriptors / Indicators: Aching;Burning Pain Intervention(s): Limited activity within patient's tolerance;Monitored during session;Repositioned           PT Goals (current goals can now be found in the care plan section) Acute Rehab PT Goals Patient Stated Goal: "to figure out what is wrong with me" Progress towards PT goals: Not progressing toward goals - comment (due to increased pain and continued low BPs)    Frequency  Min 3X/week    PT Plan Current plan remains appropriate       End of Session   Activity Tolerance: Patient limited by fatigue;Patient limited by lethargy;Treatment limited secondary to medical complications (Comment) (limited by low BPs) Patient left: in bed;with call bell/phone within reach;with family/visitor present     Time: 1215-1226 PT Time Calculation (min) (ACUTE ONLY): 11 min  Charges:  $Therapeutic Activity: 8-22 mins  Jimmy [name], PT, DPT [phone]   12/11/2014, 12:30 PM

## 2014-12-11 NOTE — Patient Instructions (Signed)
IN BED EXERCISES:   Ankle Pumps    Point toes down, then up. Repeat 10 times.  Do 2 sessions each day.     Knee Presses  Tighten top of left/right thigh. Hold for 3 seconds. Relax for 3 seconds. Repeat 10 times.  Repeat with other leg.  Do 2 sessions each day   Towel Squeezes: Next, put a towel between your knees and do knee presses while also squeezing the towel.  Hold for 3 seconds.  Relax for 3 seconds.  Repeat 10 times.  Do 2 sessions each day.   Heel Slides   Slide left/right heel along bed towards bottom. Hold for 3 seconds. Slide back to flat knee position. Repeat 10 times. Do 2 sessions each day.   Short Kicks   Place pillow or towel roll under knees. Keep your thigh on the roll and lift left/right foot until leg is straight.  Hold for 3 seconds.  Repeat 10 times.  Do 2 sessions each day.    Straight Leg Raise  Lie on your back with your left/right knee bent and foot flat.  Slowly lift left/right leg 6 inches off the bed.  It is important to keep your leg as straight as possible and your toes pointed up. Repeat 10 times.  Do 2 sessions each day.    Leg out to the side   Lie on your back.  Slide your left/right leg out to the side and then pull it back to the center.  Keep your toes pointed toward the ceiling.  Repeat 10 times.  Do 2 sessions each day.    SITTING EXERCISES:   Long Kicks   Sit on a firm seat and slowly kick your left/right foot up.  It is important to get your knee straight.  Hold for 3 seconds.  Relax for 3 seconds.  Repeat 10 times.  Do 2 sessions each day.   Copyright  VHI. All rights reserved.   High Step - Seated   Sit with back straight, feet flat, pointed out slightly. Shift weight onto one side: buttock and foot. Lift knee of unweighted foot to front. Hold _2__ seconds. Variation: Lift heel only Repeat _10__ times each side.  Copyright  VHI. All rights reserved.  Adduction: Hip - Knees Together (Sitting)   Sit with  towel roll between knees. Push knees together. Hold for _5__ seconds. Rest for _5__ seconds. Repeat _10__ times. Do _2__ times a day.  Copyright  VHI. All rights reserved.

## 2014-12-11 NOTE — Progress Notes (Signed)
Family Medicine Teaching Service Daily Progress Note Intern Pager: [phone]  Patient name: Jimmy [name] Medical record number: [id number] Date of birth: May 05, 1965 Age: 49 y.o. Gender: male  Primary Care Provider: Lucilla Edin, MD Consultants: None Code Status: FULL code  Pt Overview and Major Events to Date:  08/05 - readmission for pain control 08/07 - Foley placed for urinary retention 08/09 - MRI hip negative for septic hip  Assessment and Plan: Jimmy [name] is a 49 y.o. male presenting with abdominal and back pain. PMH is significant for Herpes zoster ophthalmicus two months ago, history of spinal stenosis and low back pain s/p surgery.  # Orthostatic hypotension: Lowest orthostatic last night (08/11) BP 64/40 after 3 minutes standing. AM cortisol slightly decreased at 4.5 Began repletion with hydrocortisone yesterday (08/12). Reports dizziness has not improved with hydrocortisone and he does not feel comfortable going home.  - Consider discontinuing hydrocortisone and begin prednisone once daily to simplify dosing upon discharge - Will need further adrenal stimulation testing and outpatient endocrinology follow-up appointment  - Cont orthostatics q4  # R hip and back pain: potentially related to chronic MSK pain s/p multiple back surgeries. Patient now describing pain in sciatic distribution. Hip MRI - no abnormalities.  PT recommends rolling walker; signed off. Reports pain is under control now.  - Gabapentin 300 mg TID, meloxicam, PO morphine, Effexor for pain control - Baclofen TID for back spasms   # Leukopenia: Concern for sepsis given tachycardia, hypotension, known infective source (prostatitis). WBC now at 3.7. - Monitor - Consider changing abx if becomes leukopenic again/other sepsis criteria persist   # Prostatitis: Discovered on prior admission last week. Urine cultures no growth. CRP and sed rate both elevated. WBC 4.2 (08/09). Currently treated with Bactrim.   - Cont Bactrim - If WBC drops again, consider switching from Bactrim to IV abx  # Urinary retention: Occurred during prior admission last week as well. Foley initially placed on 08/07. May be due to enlarged prostate from prostatitis. Postvoid residual volume was >500 cc after voiding trial. Foley reinserted.  - Resume Flomax - D/c with Foley and urology outpatient follow-up  # Constipation: Resolved s/p soap suds enema. Two episodes loose stool yesterday.  - Continue colace - D/c Miralax   # Weight loss & Anemia: reported history of 20 lb weight loss over past several months. CXR neg. CBC remarkable for anemia when compared to June 2016 and Oct 2010 from 14>>12 range. No reported early colon cancers in family. HIV negative, TSH WNL. Could be related to recent infections and decreased PO intake. Hgb 12.2. FOBT negative.  - Consider outpatient colonoscopy  # T2DM: A1c 6.5 on admission. CBGs well controlled. Consider beginning metformin on d/c. Patient had episode of dizziness and weakness after insulin administration so insulin d/ced.  - CBG ACHS  # Hx HZO: recent infection, s/p acyclovir  - Monitor clinically  FEN/GI: carb-modified diet/ saline lock Prophylaxis: heparin sq  Disposition: home pending medical improvement   Subjective:  Jimmy [name] reports that he is still very dizzy when sitting or standing. He says his dizziness is not improving with hydrocortisone and he does not feel able to go home today.  Pt's orthostatics measured while I was in room. BP 69/39 with HR 126 when sitting. Pt said he was very dizzy and weak. Did not perform subsequently standing orthostatics as a result.   Objective: Temp:  [98.2 F (36.8 C)-99.5 F (37.5 C)] 98.2 F (36.8 C) (08/12 0404) Pulse Rate:  [  100-135] 135 (08/12 0409) Resp:  [18] 18 (08/12 0404) BP: (64-134)/(40-115) 134/115 mmHg (08/12 0409) SpO2:  [99 %-100 %] 99 % (08/12 0409) Physical Exam: General: comfortably resting in  bed in NAD  Cardiovascular: RRR, no murmurs appreciated Respiratory: CTAB, no wheezing Abdomen: soft, TTP, non-distended, +BS Neuro: A&Ox3, no focal deficits  Laboratory:  Recent Labs Lab 12/08/14 1452 12/10/14 0516 12/11/14 0650  WBC 4.2 4.0 3.7*  HGB 12.7* 12.3* 12.2*  HCT 36.0* 35.2* 34.1*  PLT 216 218 179    Recent Labs Lab 12/04/14 1510  12/09/14 0645 12/10/14 0516 12/11/14 0650  NA 128*  < > 130* 131* 130*  K 3.5  < > 3.9 4.2 4.0  CL 96*  < > 97* 95* 95*  CO2 21*  < > 23 24 23   BUN <5*  < > 10 11 12   CREATININE 1.11  < > 0.97 0.95 0.89  CALCIUM 9.3  < > 9.3 9.5 9.2  PROT 6.5  --   --   --   --   BILITOT 0.7  --   --   --   --   ALKPHOS 51  --   --   --   --   ALT 19  --   --   --   --   AST 20  --   --   --   --   GLUCOSE 139*  < > 113* 110* 100*  < > = values in this interval not displayed.   Imaging/Diagnostic Tests: Mr Hip Right W Wo Contrast  12/09/2014   CLINICAL DATA:  Back and right hip pain. History of prostatitis. Question hip infection. Subsequent encounter.  EXAM: MRI OF THE RIGHT HIP WITHOUT AND WITH CONTRAST  TECHNIQUE: Multiplanar, multisequence MR imaging was performed both before and after administration of intravenous contrast.  CONTRAST:  18 mL MULTIHANCE GADOBENATE DIMEGLUMINE 529 MG/ML IV SOLN  COMPARISON:  CT abdomen and pelvis 12/02/2014. Lumbar spine MRI 11/30/2014.  FINDINGS: Bones: Bone marrow signal is normal in both hips, throughout the pelvis and visualized femurs. Mild degenerative endplate signal change is noted at L4-5 as seen on the comparison lumbar spine MRI.  Articular cartilage and labrum  Articular cartilage:  Appears normal.  Labrum:  Intact.  Joint or bursal effusion  Joint effusion:  None.  Bursae:  Unremarkable.  Muscles and tendons  Muscles and tendons:  Intact.  Other findings  Miscellaneous:  None.  IMPRESSION: Negative examination. There is no evidence of septic hip. No abnormality to explain the patient's symptoms is  identified.   Electronically Signed   By: Jimmy [name] M.D.   On: 12/09/2014 07:57     Jimmy Krupp Shelbie Hutching, MD 12/11/2014, 9:35 AM PGY-1, [facility] Family Medicine FPTS Intern pager: [phone], text pages welcome

## 2014-12-11 NOTE — Care Management Note (Signed)
Case Management Note  Patient Details  Name: Jimmy [name] MRN: [id number] Date of Birth: [birth year]  Subjective/Objective:       Patient conts to have problems with orthostatics,  He received hydrocortisone tablet yesterday but did not improve.  NCM will cont to follow for dc needs.              Action/Plan:   Expected Discharge Date:                  Expected Discharge Plan:  Home w Home Health Services  In-House Referral:     Discharge planning Services  CM Consult  Post Acute Care Choice:    Choice offered to:     DME Arranged:    DME Agency:     HH Arranged:    HH Agency:     Status of Service:  In process, will continue to follow  Medicare Important Message Given:  Yes-third notification given Date Medicare IM Given:    Medicare IM give by:    Date Additional Medicare IM Given:    Additional Medicare Important Message give by:     If discussed at Long Length of Stay Meetings, dates discussed:    Additional Comments:  Leone Haven, RN 12/11/2014, 11:42 AM

## 2014-12-11 NOTE — Progress Notes (Signed)
FPTS Interim Progress Note  S: Paged by nurse to see patient. They were doing orthostatic vital signs when he was noted to be bradycardic (HR 37) and on standing BP cuff did not register a reading. Patient began feeling very weak/faint and also has some chest pain. Pt reports the same symptoms along with some discomfort in his chest similar to earlier today. Also having some headache, no changes in vision.  O: BP 110/68 mmHg  Pulse 104  Temp(Src) 98.2 F (36.8 C) (Oral)  Resp 18  Ht  (1.803 m)  Wt 198 lb 3.2 oz (89.903 kg)  BMI 27.66 kg/m2  SpO2 94%   General: appears sleepy CV: regular rate and rhythm, no murmurs appreciated. 2+ radial pulses bilaterally Resp: CTAB, normal effort Neuro: alert, oriented, though acting somewhat drowsy  A/P: 49 y.o. male with suspect autonomic dysfunction/POTS causing profound orthostatic hypotension. Discussed with patient and we will STOP doing orthostatics for the remainder of the evening and until he is on both midodrine and florinef tomorrow. Repeat EKG now for bradycardia. Will also give 1L bolus at this time and encouraged PO fluids.  Nani Ravens, MD 12/11/2014, 6:52 PM PGY-3, [facility] Family Medicine Service pager [phone]

## 2014-12-12 ENCOUNTER — Encounter (HOSPITAL_COMMUNITY): Payer: Self-pay | Admitting: Cardiology

## 2014-12-12 DIAGNOSIS — R9431 Abnormal electrocardiogram [ECG] [EKG]: Secondary | ICD-10-CM

## 2014-12-12 LAB — TROPONIN I
Troponin I: <0.03 ng/mL (ref <0.031)
Troponin I: <0.03 ng/mL (ref <0.031)

## 2014-12-12 LAB — GLUCOSE, CAPILLARY
Glucose-Capillary: 87 mg/dL (ref 65–99)
Glucose-Capillary: 127 mg/dL — ABNORMAL HIGH (ref 65–99)
Glucose-Capillary: 118 mg/dL — ABNORMAL HIGH (ref 65–99)
Glucose-Capillary: 103 mg/dL — ABNORMAL HIGH (ref 65–99)

## 2014-12-12 NOTE — Consult Note (Signed)
Reason for Consult:   Abnormal EKG  Requesting Physician: [facility] Primary Cardiologist New  HPI: This is a 49 y.o.disabled AA male with a past medical history significant for chronic back pain. He was recently diagnosed with prostatitis and admitted 12/01/14. He has developed significant orthostatic hypotension. He has had continued abdominal and back pain. He was scheduled to possible go home today but he remains orthostatic with symptoms. Today he became tachycardic when up, then bradycardic. His EKG now shows TWI in the inferior leads. He has no history of CAD. He was recently diagnosed with NIDDM. He is an ex smoker (quit 7 yrs ago). He did have some chest pain associated with his orthostatic episode but now only complains of localized Rt chest pain. Unfortunately he is not on telemetry so there is no documentation of his rhythm.   PMHx:  Past Medical History  Diagnosis Date  . Back pain     Past Surgical History  Procedure Laterality Date  . Back surgery      SOCHx:  reports that he quit smoking about 7 years ago. He has never used smokeless tobacco. He reports that he does not drink alcohol or use illicit drugs.  FAMHx: Family History  Problem Relation Age of Onset  . Heart disease Mother   . Heart disease Maternal Grandmother   . Cancer Maternal Grandfather     ALLERGIES: Allergies  Allergen Reactions  . Antihistamines, Chlorpheniramine-Type Other (See Comments)    Hallucinations    ROS: Pertinent items are noted in HPI. see H&P for complete ROS  HOME MEDICATIONS: Prior to Admission medications   Medication Sig Start Date End Date Taking? Authorizing Provider  ibuprofen (ADVIL,MOTRIN) 200 MG tablet Take 400-600 mg by mouth every 6 (six) hours as needed for moderate pain.    Yes Historical Provider, MD  phenazopyridine (PYRIDIUM) 100 MG tablet Take 1 tablet (100 mg total) by mouth 3 (three) times daily with meals. 12/03/14  Yes Asiyah Mayra Reel, MD    sulfamethoxazole-trimethoprim (BACTRIM DS,SEPTRA DS) 800-160 MG per tablet Take 1 tablet by mouth 2 (two) times daily. Patient taking differently: Take 1 tablet by mouth 2 (two) times daily. 42 day course started 12/03/14 pm 12/03/14  Yes Asiyah Mayra Reel, MD  docusate sodium (COLACE) 250 MG capsule Take 1 capsule (250 mg total) by mouth daily. 12/04/14   Tishira R Brewington, PA-C  HYDROcodone-acetaminophen (NORCO/VICODIN) 5-325 MG per tablet Take 1-2 tablets by mouth every 4 (four) hours as needed. Patient not taking: Reported on 11/30/2014 10/21/14   Everlene Farrier, PA-C  ondansetron (ZOFRAN ODT) 4 MG disintegrating tablet Take 1 tablet (4 mg total) by mouth every 8 (eight) hours as needed for nausea or vomiting. Patient not taking: Reported on 12/04/2014 11/30/14   Francee Piccolo, PA-C  polyethylene glycol powder (GLYCOLAX/MIRALAX) powder Take 17 g by mouth 2 (two) times daily as needed. Patient taking differently: Take 17 g by mouth 2 (two) times daily as needed (constipation).  12/04/14   Tishira R Brewington, PA-C  traMADol (ULTRAM) 50 MG tablet Take 1 tablet (50 mg total) by mouth every 8 (eight) hours as needed. Patient taking differently: Take 50 mg by mouth every 8 (eight) hours as needed (pain).  12/04/14   Hinton Rao, PA-C    HOSPITAL MEDICATIONS: I have reviewed the patient's current medications.  VITALS: Blood pressure 111/61, pulse 96, temperature 98.3 F (36.8 C), temperature source Oral, resp. rate 18, height  (1.803  m), weight 198 lb 3.2 oz (89.903 kg), SpO2 100 %.  PHYSICAL EXAM: General appearance: alert, cooperative, no distress and mildly obese Neck: no carotid bruit and no JVD Lungs: clear to auscultation bilaterally Heart: regular rate and rhythm Abdomen: obese, small umbilical hernia Extremities: extremities normal, atraumatic, no cyanosis or edema Pulses: 2+ and symmetric Skin: Skin color, texture, turgor normal. No rashes or lesions Neurologic:  Grossly normal  LABS: Results for orders placed or performed during the hospital encounter of 12/04/14 (from the past 24 hour(s))  Glucose, capillary     Status: Abnormal   Collection Time: 12/11/14  4:58 PM  Result Value Ref Range   Glucose-Capillary 126 (H) 65 - 99 mg/dL  Glucose, capillary     Status: Abnormal   Collection Time: 12/11/14  9:19 PM  Result Value Ref Range   Glucose-Capillary 107 (H) 65 - 99 mg/dL   Comment 1 Notify RN    Comment 2 Document in Chart   Glucose, capillary     Status: None   Collection Time: 12/12/14  7:52 AM  Result Value Ref Range   Glucose-Capillary 87 65 - 99 mg/dL  Glucose, capillary     Status: Abnormal   Collection Time: 12/12/14 12:07 PM  Result Value Ref Range   Glucose-Capillary 127 (H) 65 - 99 mg/dL  Troponin I (q 6hr x 3)     Status: None   Collection Time: 12/12/14 12:20 PM  Result Value Ref Range   Troponin I <0.03 <0.031 ng/mL    EKG: NSR with PACs, TWI 2,3,F (new c/w 11/29/14)  IMAGING: CXR 12/04/14 NAD  IMPRESSION: Principal Problem:   Abdominal pain Active Problems:   Orthostatic hypotension   POTS (postural orthostatic tachycardia syndrome)   Prostatitis, acute   Chronic lower back pain   Groin pain   Adrenal insufficiency   Abnormal EKG   RECOMMENDATION: Will review with MD, check echo.   Time Spent Directly with Patient: 45 minutes  Abelino Derrick [phone] beeper 12/12/2014, 1:54 PM   Personally seen and examined. Agree with above.  49 year old male with newly discovered T-wave inversions on EKG, atypical chest pain, orthostatic hypotension.  -We will check echocardiogram to ensure proper structure and function of his left ventricle.  -If abnormal, we will likely proceed with stress test.  -If normal, continue with current management  -Chest pain fairly atypical.  -Troponins have been normal.  - POTS/ dysautonomia is often very challenging. Continue with salt liberalization, fluid administration,  sports drinks are helpful.  We will follow with results of echocardiogram.

## 2014-12-12 NOTE — Progress Notes (Signed)
Family Medicine Teaching Service Daily Progress Note Intern Pager: [phone]  Patient name: Jimmy [name] Medical record number: [id number] Date of birth: [birth year] Age: 49 y.o. Gender: male  Primary Care Provider: Lucilla Edin, MD Consultants: None Code Status: FULL code  Pt Overview and Major Events to Date:  08/05 - readmission for pain control 08/07 - Foley placed for urinary retention 08/09 - MRI hip negative for septic hip  Assessment and Plan: Jimmy [name] is a 49 y.o. male presenting with abdominal and back pain. PMH is significant for Herpes zoster ophthalmicus two months ago, history of spinal stenosis and low back pain s/p surgery.  # Orthostatic hypotension: Lowest orthostatic last night (08/11) BP 64/40 after 3 minutes standing. AM cortisol slightly decreased at 4.5 Began repletion with hydrocortisone yesterday (08/12). Reports dizziness has not improved with hydrocortisone and he does not feel comfortable going home.  - Consider discontinuing hydrocortisone and begin prednisone once daily to simplify dosing upon discharge - Will need further adrenal stimulation testing and outpatient endocrinology follow-up appointment  - Cont orthostatics q4  # New inferior lead t-wave inversion: no current chest pain although had previous episode. Troponin negative previously. Possible could be contributing to hypotension, but appears to be a new finding. No prior echo. - repeat troponin q6hr - consult cardiology  # R hip and back pain: potentially related to chronic MSK pain s/p multiple back surgeries. Patient now describing pain in sciatic distribution. Hip MRI - no abnormalities.  PT recommends rolling walker; signed off. Reports pain is under control now.  - Gabapentin 300 mg TID, meloxicam, PO morphine, Effexor for pain control - Baclofen TID for back spasms   # Leukopenia: Mild. Initial concern for sepsis given tachycardia, hypotension, known infective source  (prostatitis). - Monitor  # Prostatitis: Discovered on prior admission last week. Urine cultures no growth. CRP and sed rate both elevated. WBC 4.2 (08/09). Currently treated with Bactrim.  - Cont Bactrim  # Urinary retention: Occurred during prior admission last week as well. Foley initially placed on 08/07. May be due to enlarged prostate from prostatitis. Postvoid residual volume was >500 cc after voiding trial. Foley reinserted.  - Resume Flomax - D/c with Foley and urology outpatient follow-up  # Constipation: Resolved s/p soap suds enema. Two episodes loose stool yesterday.  - Continue colace - D/c Miralax   # Weight loss & Anemia: reported history of 20 lb weight loss over past several months. CXR neg. CBC remarkable for anemia when compared to June 2016 and Oct 2010 from 14>>12 range. No reported early colon cancers in family. HIV negative, TSH WNL. Could be related to recent infections and decreased PO intake. Hgb 12.2. FOBT negative.  - Consider outpatient colonoscopy  # T2DM: A1c 6.5 on admission. CBGs well controlled. Consider beginning metformin on d/c. Patient had episode of dizziness and weakness after insulin administration so insulin d/ced.  - CBG AC,HS  # Hx HZO: recent infection, s/p acyclovir  - Monitor clinically  FEN/GI: carb-modified diet/ saline lock Prophylaxis: heparin sq  Disposition: home pending medical improvement   Subjective:  No real improvement of dizzy symptoms. He is still having back pain, abdominal and leg pain, but states his pain is improving. No chest pain. Overnight, he was bothered by the constant orthostatic vital checks, but otherwise, had an uneventful night.  Objective: Temp:  [97.5 F (36.4 C)-98.2 F (36.8 C)] 98.1 F (36.7 C) (08/13 1045) Pulse Rate:  [92-115] 115 (08/13 1049) Resp:  [18-20] 20 (08/13  1049) BP: (80-122)/(47-74) 95/60 mmHg (08/13 1049) SpO2:  [87 %-100 %] 99 % (08/13 1049)   Physical Exam: General:  comfortably resting in bed in NAD  Cardiovascular: RRR, no murmurs appreciated Respiratory: CTAB, no wheezing Abdomen: soft, diffuse tenderness, non-distended, +BS Neuro: A&Ox3, no focal deficits  Laboratory:  Recent Labs Lab 12/08/14 1452 12/10/14 0516 12/11/14 0650  WBC 4.2 4.0 3.7*  HGB 12.7* 12.3* 12.2*  HCT 36.0* 35.2* 34.1*  PLT 216 218 179    Recent Labs Lab 12/09/14 0645 12/10/14 0516 12/11/14 0650  NA 130* 131* 130*  K 3.9 4.2 4.0  CL 97* 95* 95*  CO2 BUN CREATININE 0.97 0.95 0.89  CALCIUM 9.3 9.5 9.2  GLUCOSE 113* 110* 100*     Imaging/Diagnostic Tests: Mr Hip Right W Wo Contrast  12/09/2014   CLINICAL DATA:  Back and right hip pain. History of prostatitis. Question hip infection. Subsequent encounter.  EXAM: MRI OF THE RIGHT HIP WITHOUT AND WITH CONTRAST  TECHNIQUE: Multiplanar, multisequence MR imaging was performed both before and after administration of intravenous contrast.  CONTRAST:  18 mL MULTIHANCE GADOBENATE DIMEGLUMINE 529 MG/ML IV SOLN  COMPARISON:  CT abdomen and pelvis 12/02/2014. Lumbar spine MRI 11/30/2014.  FINDINGS: Bones: Bone marrow signal is normal in both hips, throughout the pelvis and visualized femurs. Mild degenerative endplate signal change is noted at L4-5 as seen on the comparison lumbar spine MRI.  Articular cartilage and labrum  Articular cartilage:  Appears normal.  Labrum:  Intact.  Joint or bursal effusion  Joint effusion:  None.  Bursae:  Unremarkable.  Muscles and tendons  Muscles and tendons:  Intact.  Other findings  Miscellaneous:  None.  IMPRESSION: Negative examination. There is no evidence of septic hip. No abnormality to explain the patient's symptoms is identified.   Electronically Signed   By: Jimmy [name] M.D.   On: 12/09/2014 07:57     Jimmy Bonds, MD 12/12/2014, 10:50 AM PGY-3, [facility] FPTS Intern pager: [phone], text pages welcome

## 2014-12-13 ENCOUNTER — Inpatient Hospital Stay (HOSPITAL_COMMUNITY): Payer: Medicare Other

## 2014-12-13 DIAGNOSIS — R1010 Upper abdominal pain, unspecified: Secondary | ICD-10-CM

## 2014-12-13 DIAGNOSIS — R9431 Abnormal electrocardiogram [ECG] [EKG]: Secondary | ICD-10-CM

## 2014-12-13 LAB — TROPONIN I: Troponin I: <0.03 ng/mL (ref <0.031)

## 2014-12-13 LAB — GLUCOSE, CAPILLARY
Glucose-Capillary: 103 mg/dL — ABNORMAL HIGH (ref 65–99)
Glucose-Capillary: 121 mg/dL — ABNORMAL HIGH (ref 65–99)

## 2014-12-13 MED ORDER — VENLAFAXINE HCL 25 MG PO TABS: 25.0000 mg | ORAL_TABLET | Freq: Two times a day (BID) | ORAL | Status: DC

## 2014-12-13 MED ORDER — BACLOFEN 10 MG PO TABS: 5.0000 mg | ORAL_TABLET | Freq: Three times a day (TID) | ORAL | Status: DC | PRN

## 2014-12-13 MED ORDER — MELOXICAM 7.5 MG PO TABS: 7.5000 mg | ORAL_TABLET | Freq: Every day | ORAL | Status: DC

## 2014-12-13 MED ORDER — TAMSULOSIN HCL 0.4 MG PO CAPS: 0.4000 mg | ORAL_CAPSULE | Freq: Every day | ORAL | Status: DC

## 2014-12-13 MED ORDER — MORPHINE SULFATE 15 MG PO TABS: 15.0000 mg | ORAL_TABLET | Freq: Four times a day (QID) | ORAL | Status: DC | PRN

## 2014-12-13 MED ORDER — GABAPENTIN 300 MG PO CAPS: 300.0000 mg | ORAL_CAPSULE | Freq: Three times a day (TID) | ORAL | Status: DC

## 2014-12-13 MED ORDER — FLUDROCORTISONE ACETATE 0.1 MG PO TABS: 0.1000 mg | ORAL_TABLET | Freq: Every day | ORAL | Status: DC

## 2014-12-13 MED ORDER — MIDODRINE HCL 5 MG PO TABS: 5.0000 mg | ORAL_TABLET | Freq: Three times a day (TID) | ORAL | Status: DC

## 2014-12-13 MED ORDER — WHITE PETROLATUM GEL
Status: AC
Start: 2014-12-13 — End: 2014-12-13
  Administered 2014-12-13: 18:00:00
  Filled 2014-12-13: qty 1

## 2014-12-13 NOTE — Progress Notes (Signed)
      Reassuring ECHO, normal EF. No further cardiac testing. Further treatment of orthostatics per primary team. Florinef/midodrine, salt, hydration.  Adrenal testing.  Will sign off. Please call if needed.  Donato Schultz, MD

## 2014-12-13 NOTE — Progress Notes (Signed)
Echocardiogram 2D Echocardiogram has been performed.  Dorothey Baseman 12/13/2014, 8:52 AM

## 2014-12-13 NOTE — Progress Notes (Addendum)
FPTS Interim Progress Note  S: Went to evaluate patient in PM today for re-check to see if potential discharge home. He was still having dizziness earlier when evaluated, but had not made major attempts to get out of bed.  Patient sitting up on side of bed with walker in front of him. Family at bedside. He states he attempted to walk around but still dizzy. Does not feel safe going home right now, would feel more comfortable going home tomorrow. Still complaining of same pain in back and right lower ext as previous, concerned Morphine is not covering his pain. No new complaints. Still foley in place. Aware of current plan to discharge with Bactrim for possible prostatitis, medicines for POTS, he plans to follow-up with Dr. Cleta Alberts at [facility] soon, and future Endocrinology and Urology follow-up.  O: BP 109/64 mmHg  Pulse 94  Temp(Src) 98.3 F (36.8 C) (Oral)  Resp 18  Ht  (1.803 m)  Wt 198 lb 3.2 oz (89.903 kg)  BMI 27.66 kg/m2  SpO2 100%  Gen - sitting up on side of bed, comfortable, cooperative, NAD Heart - RRR, no murmurs Lungs - CTAB, good air movement Ext - no edema, non-tender Neuro - AAOx3, non-focal  A/P: Briefly, 89 yr M admitted for variety of pains, suspected prostatitis with complex back history with multiple surgeries for spinal stenosis. Further work-up has confirmed Orthostatic hypotension with postural tachycardia (POTS) with likely adrenal insufficiency. Imaging during hospitalization with multiple X-rays, MRIs have been negative for acute etiology of pain. - See detailed progress note dated 8/14 for further details on problem list - Currently patient not comfortable going home this afternoon, due to still dizzy and unsteady, needs more time to ambulate - Continue PO hydration, salt intake, Florinef and Midodrine, will go home with these rx - Continue Bactrim for possible prostatitis (already has 6 wk rx at home) - Continue current Morphine  PO q 6 hr PRN - Keep  Foley in place on discharge likely tomorrow - ECHO negative, Cardiology signed off 8/14 - Will need follow-up arranged for Endocrine (further adrenal stim testing) and Urology (voiding trial w/o foley for urinary retention) - Close follow-up with [facility] on discharge, would be considered high risk of repeat admission  UPDATE @ approx 1715 paged by patients RN stating patient has been doing better and now feels more comfortable, and requesting to be discharged home as previously discussed by rounding team. - Discharge to home orders placed - Already has Bactrim abx at home and appropriate instructions - New rx given for Morphine  IR q 6 hr PRN (#30, 0 refills), printed and given on DC - Also given baclofen, florinef, midodrine, effexor - Instructions given to follow-up at [facility] within 1 week, may need to do walk-in, follow-up foley voiding trial, discuss with PCP will need referral to Endocrine and Urology  Smitty Cords, DO 12/13/2014, 3:39 PM PGY-3, [facility] Family Medicine Service pager [phone]

## 2014-12-13 NOTE — Progress Notes (Signed)
Family Medicine Teaching Service Daily Progress Note Intern Pager: [phone]  Patient name: Jimmy [name] Medical record number: [id number] Date of birth: Aug 12, 1965 Age: 49 y.o. Gender: male  Primary Care Provider: Lucilla Edin, MD Consultants: None Code Status: FULL code  Pt Overview and Major Events to Date:  08/05 - readmission for pain control 08/07 - Foley placed for urinary retention 08/09 - MRI hip negative for septic hip  Assessment and Plan: Jimmy [name] is a 49 y.o. male presenting with abdominal and back pain. PMH is significant for Herpes zoster ophthalmicus two months ago, history of spinal stenosis and low back pain s/p surgery.  # Orthostatic hypotension: Lowest orthostatic last night (08/11) BP 64/40 after 3 minutes standing. AM cortisol slightly decreased at 4.5 Began repletion with hydrocortisone yesterday (08/12). Reports dizziness has not improved with hydrocortisone and he does not feel comfortable going home.  - Will need further adrenal stimulation testing and outpatient endocrinology follow-up appointment  - orthostatics qshift - florinef and midodrine started yesterday  # New inferior lead t-wave inversion: no current chest pain although had previous episode. Troponin negative previously. Possible could be contributing to hypotension, but appears to be a new finding. No prior echo. - repeat troponin q6hr - echocardiogram per cardiology  # R hip and back pain: potentially related to chronic MSK pain s/p multiple back surgeries. Patient now describing pain in sciatic distribution. Hip MRI - no abnormalities.  PT recommends rolling walker; signed off. Reports pain is under control now.  - Gabapentin 300 mg TID, meloxicam, PO morphine, Effexor for pain control - Baclofen TID for back spasms   # Leukopenia: Mild. Initial concern for sepsis given tachycardia, hypotension, known infective source (prostatitis). - Monitor  # Prostatitis: Discovered on prior  admission last week. Urine cultures no growth. CRP and sed rate both elevated. WBC 4.2 (08/09). Currently treated with Bactrim.  - Cont Bactrim  # Urinary retention: Occurred during prior admission last week as well. Foley initially placed on 08/07. May be due to enlarged prostate from prostatitis. Postvoid residual volume was >500 cc after voiding trial. Foley reinserted.  - Resume Flomax - D/c with Foley and urology outpatient follow-up  # Constipation: Resolved s/p soap suds enema. Two episodes loose stool yesterday.  - Continue colace - D/c Miralax   # Weight loss & Anemia: reported history of 20 lb weight loss over past several months. CXR neg. CBC remarkable for anemia when compared to June 2016 and Oct 2010 from 14>>12 range. No reported early colon cancers in family. HIV negative, TSH WNL. Could be related to recent infections and decreased PO intake. Hgb 12.2. FOBT negative.  - Consider outpatient colonoscopy  # T2DM: A1c 6.5 on admission. CBGs well controlled. Consider beginning metformin on d/c. Patient had episode of dizziness and weakness after insulin administration so insulin d/ced.  - CBG AC,HS  # Hx HZO: recent infection, s/p acyclovir  - Monitor clinically  FEN/GI: carb-modified diet/ saline lock Prophylaxis: heparin sq  Disposition: home pending medical improvement   Subjective:  He has not tried to get up out of bed. He has some dizziness when sitting up. Pain is persisting and back pain slightly worse which he thinks is because he has been lying in bed for so long.  Objective: Temp:  [97.5 F (36.4 C)-98.4 F (36.9 C)] 98.4 F (36.9 C) (08/13 2226) Pulse Rate:  [66-115] 66 (08/13 2226) Resp:  [18-20] 18 (08/13 2226) BP: (80-133)/(47-80) 133/80 mmHg (08/13 2226) SpO2:  [87 %-  100 %] 98 % (08/13 2226)   Physical Exam: General: comfortably resting in bed in NAD  Cardiovascular: RRR, no murmurs appreciated Respiratory: CTAB, no wheezing Abdomen: soft,  diffuse mild tenderness, non-distended, +BS Neuro: A&Ox3, no focal deficits  Laboratory:  Recent Labs Lab 12/08/14 1452 12/10/14 0516 12/11/14 0650  WBC 4.2 4.0 3.7*  HGB 12.7* 12.3* 12.2*  HCT 36.0* 35.2* 34.1*  PLT 216 218 179    Recent Labs Lab 12/09/14 0645 12/10/14 0516 12/11/14 0650  NA 130* 131* 130*  K 3.9 4.2 4.0  CL 97* 95* 95*  CO2 BUN CREATININE 0.97 0.95 0.89  CALCIUM 9.3 9.5 9.2  GLUCOSE 113* 110* 100*     Imaging/Diagnostic Tests: No results found.   Jimmy Bonds, MD 12/13/2014, 12:54 AM PGY-3, [facility] FPTS Intern pager: [phone], text pages welcome

## 2014-12-13 NOTE — Progress Notes (Signed)
Patient discharge teaching given, including activity, diet, follow-up appoints, and medications. Patient verbalized understanding of all discharge instructions. IV access was d/c'd. Vitals are stable. Skin is intact except as charted in most recent assessments. Pt to be escorted out by NT, to be driven home by family. 

## 2014-12-14 DIAGNOSIS — M25551 Pain in right hip: Secondary | ICD-10-CM | POA: Insufficient documentation

## 2014-12-15 LAB — PRESCRIPTION MONITORING PROFILE (9 PANEL)
Amphetamine/Meth: NEGATIVE ng/mL
Barbiturate Screen, Urine: NEGATIVE ng/mL
Benzodiazepine Screen, Urine: NEGATIVE ng/mL
Cannabinoid Scrn, Ur: NEGATIVE ng/mL
Cocaine Metabolites: NEGATIVE ng/mL
Creatinine, Urine: 49.27 mg/dL (ref >20.0)
Methadone Screen, Urine: NEGATIVE ng/mL
Nitrites, Initial: NEGATIVE ug/mL
Oxycodone Screen, Ur: NEGATIVE ng/mL
Propoxyphene: NEGATIVE ng/mL
pH, Initial: 6.7 pH (ref 4.5–8.9)

## 2014-12-17 ENCOUNTER — Encounter (HOSPITAL_COMMUNITY): Payer: Self-pay

## 2014-12-17 ENCOUNTER — Ambulatory Visit (INDEPENDENT_AMBULATORY_CARE_PROVIDER_SITE_OTHER): Payer: Medicare Other | Admitting: Emergency Medicine

## 2014-12-17 ENCOUNTER — Observation Stay (HOSPITAL_COMMUNITY): Payer: Medicare Other

## 2014-12-17 ENCOUNTER — Inpatient Hospital Stay (HOSPITAL_COMMUNITY)
Admission: EM | Admit: 2014-12-17 | Discharge: 2014-12-23 | DRG: 312 | Disposition: A | Discharge status: Home / Self Care | Payer: Medicare Other | Attending: Family Medicine | Admitting: Family Medicine

## 2014-12-17 VITALS — BP 92/60 | HR 130 | Temp 98.2°F | Resp 18 | Ht 70.0 in | Wt 193.0 lb

## 2014-12-17 DIAGNOSIS — G8929 Other chronic pain: Secondary | ICD-10-CM | POA: Diagnosis present

## 2014-12-17 DIAGNOSIS — I4891 Unspecified atrial fibrillation: Secondary | ICD-10-CM | POA: Diagnosis not present

## 2014-12-17 DIAGNOSIS — I498 Other specified cardiac arrhythmias: Secondary | ICD-10-CM | POA: Diagnosis not present

## 2014-12-17 DIAGNOSIS — R1031 Right lower quadrant pain: Secondary | ICD-10-CM | POA: Diagnosis not present

## 2014-12-17 DIAGNOSIS — R55 Syncope and collapse: Secondary | ICD-10-CM | POA: Diagnosis present

## 2014-12-17 DIAGNOSIS — N419 Inflammatory disease of prostate, unspecified: Secondary | ICD-10-CM | POA: Diagnosis present

## 2014-12-17 DIAGNOSIS — B0230 Zoster ocular disease, unspecified: Secondary | ICD-10-CM | POA: Diagnosis present

## 2014-12-17 DIAGNOSIS — R000 Tachycardia, unspecified: Secondary | ICD-10-CM

## 2014-12-17 DIAGNOSIS — R630 Anorexia: Secondary | ICD-10-CM | POA: Diagnosis not present

## 2014-12-17 DIAGNOSIS — D649 Anemia, unspecified: Secondary | ICD-10-CM | POA: Diagnosis not present

## 2014-12-17 DIAGNOSIS — I951 Orthostatic hypotension: Secondary | ICD-10-CM

## 2014-12-17 DIAGNOSIS — Z888 Allergy status to other drugs, medicaments and biological substances status: Secondary | ICD-10-CM

## 2014-12-17 DIAGNOSIS — E2740 Unspecified adrenocortical insufficiency: Secondary | ICD-10-CM | POA: Diagnosis not present

## 2014-12-17 DIAGNOSIS — R42 Dizziness and giddiness: Secondary | ICD-10-CM | POA: Diagnosis present

## 2014-12-17 DIAGNOSIS — Z791 Long term (current) use of non-steroidal anti-inflammatories (NSAID): Secondary | ICD-10-CM

## 2014-12-17 DIAGNOSIS — R634 Abnormal weight loss: Secondary | ICD-10-CM | POA: Diagnosis present

## 2014-12-17 DIAGNOSIS — Z960 Presence of urogenital implants: Secondary | ICD-10-CM | POA: Diagnosis present

## 2014-12-17 DIAGNOSIS — E871 Hypo-osmolality and hyponatremia: Secondary | ICD-10-CM | POA: Diagnosis not present

## 2014-12-17 DIAGNOSIS — Z79899 Other long term (current) drug therapy: Secondary | ICD-10-CM

## 2014-12-17 DIAGNOSIS — K5900 Constipation, unspecified: Secondary | ICD-10-CM | POA: Diagnosis present

## 2014-12-17 DIAGNOSIS — Z7952 Long term (current) use of systemic steroids: Secondary | ICD-10-CM

## 2014-12-17 DIAGNOSIS — M545 Low back pain: Secondary | ICD-10-CM | POA: Diagnosis present

## 2014-12-17 DIAGNOSIS — E119 Type 2 diabetes mellitus without complications: Secondary | ICD-10-CM | POA: Diagnosis present

## 2014-12-17 DIAGNOSIS — Z87891 Personal history of nicotine dependence: Secondary | ICD-10-CM

## 2014-12-17 DIAGNOSIS — M4800 Spinal stenosis, site unspecified: Secondary | ICD-10-CM | POA: Diagnosis present

## 2014-12-17 DIAGNOSIS — G90A Postural orthostatic tachycardia syndrome (POTS): Secondary | ICD-10-CM

## 2014-12-17 DIAGNOSIS — G894 Chronic pain syndrome: Secondary | ICD-10-CM | POA: Insufficient documentation

## 2014-12-17 DIAGNOSIS — Z79891 Long term (current) use of opiate analgesic: Secondary | ICD-10-CM

## 2014-12-17 DIAGNOSIS — R339 Retention of urine, unspecified: Secondary | ICD-10-CM | POA: Diagnosis present

## 2014-12-17 LAB — CBC WITH DIFFERENTIAL/PLATELET
Basophils Absolute: 0 10*3/uL (ref 0.0–0.1)
Basophils Relative: 1 % (ref 0–1)
Eosinophils Absolute: 0.1 10*3/uL (ref 0.0–0.7)
Eosinophils Relative: 3 % (ref 0–5)
HCT: 29.5 % — ABNORMAL LOW (ref 39.0–52.0)
Hemoglobin: 10.4 g/dL — ABNORMAL LOW (ref 13.0–17.0)
Lymphocytes Relative: 16 % (ref 12–46)
Lymphs Abs: 0.8 10*3/uL (ref 0.7–4.0)
MCH: 33.8 pg (ref 26.0–34.0)
MCHC: 35.3 g/dL (ref 30.0–36.0)
MCV: 95.8 fL (ref 78.0–100.0)
Monocytes Absolute: 0.5 10*3/uL (ref 0.1–1.0)
Monocytes Relative: 11 % (ref 3–12)
Neutro Abs: 3.4 10*3/uL (ref 1.7–7.7)
Neutrophils Relative %: 69 % (ref 43–77)
Platelets: 221 10*3/uL (ref 150–400)
RBC: 3.08 MIL/uL — ABNORMAL LOW (ref 4.22–5.81)
RDW: 12.5 % (ref 11.5–15.5)
WBC: 4.9 10*3/uL (ref 4.0–10.5)

## 2014-12-17 LAB — URINALYSIS, ROUTINE W REFLEX MICROSCOPIC
Bilirubin Urine: NEGATIVE
Glucose, UA: NEGATIVE mg/dL
Hgb urine dipstick: NEGATIVE
Ketones, ur: NEGATIVE mg/dL
Leukocytes, UA: NEGATIVE
Nitrite: NEGATIVE
Protein, ur: NEGATIVE mg/dL
Specific Gravity, Urine: 1.004 — ABNORMAL LOW (ref 1.005–1.030)
Urobilinogen, UA: 1 mg/dL (ref 0.0–1.0)
pH: 6.5 (ref 5.0–8.0)

## 2014-12-17 LAB — CBC
HCT: 34.1 % — ABNORMAL LOW (ref 39.0–52.0)
Hemoglobin: 11.9 g/dL — ABNORMAL LOW (ref 13.0–17.0)
MCH: 34.6 pg — ABNORMAL HIGH (ref 26.0–34.0)
MCHC: 34.9 g/dL (ref 30.0–36.0)
MCV: 99.1 fL (ref 78.0–100.0)
Platelets: 171 10*3/uL (ref 150–400)
RBC: 3.44 MIL/uL — ABNORMAL LOW (ref 4.22–5.81)
RDW: 12.6 % (ref 11.5–15.5)
WBC: 3.1 10*3/uL — ABNORMAL LOW (ref 4.0–10.5)

## 2014-12-17 LAB — BASIC METABOLIC PANEL
Anion gap: 9 (ref 5–15)
BUN: 8 mg/dL (ref 6–20)
CO2: 25 mmol/L (ref 22–32)
Calcium: 9.6 mg/dL (ref 8.9–10.3)
Chloride: 97 mmol/L — ABNORMAL LOW (ref 101–111)
Creatinine, Ser: 0.85 mg/dL (ref 0.61–1.24)
GFR calc Af Amer: >60 mL/min (ref >60)
GFR calc non Af Amer: >60 mL/min (ref >60)
Glucose, Bld: 107 mg/dL — ABNORMAL HIGH (ref 65–99)
Potassium: 3.9 mmol/L (ref 3.5–5.1)
Sodium: 131 mmol/L — ABNORMAL LOW (ref 135–145)

## 2014-12-17 LAB — I-STAT CG4 LACTIC ACID, ED: Lactic Acid, Venous: 1.05 mmol/L (ref 0.5–2.0)

## 2014-12-17 LAB — I-STAT TROPONIN, ED: Troponin i, poc: 0 ng/mL (ref 0.00–0.08)

## 2014-12-17 LAB — CREATININE, SERUM
Creatinine, Ser: 0.81 mg/dL (ref 0.61–1.24)
GFR calc Af Amer: >60 mL/min (ref >60)
GFR calc non Af Amer: >60 mL/min (ref >60)

## 2014-12-17 MED ORDER — MORPHINE SULFATE (PF) 4 MG/ML IV SOLN: 4.0000 mg | Freq: Once | INTRAVENOUS | Status: DC

## 2014-12-17 MED ORDER — POLYETHYLENE GLYCOL 3350 17 G PO PACK
17.0000 g | PACK | Freq: Two times a day (BID) | ORAL | Status: DC | PRN
  Administered 2014-12-19 (×2): 17 g via ORAL
  Filled 2014-12-17 (×3): qty 1

## 2014-12-17 MED ORDER — GABAPENTIN 300 MG PO CAPS
300.0000 mg | ORAL_CAPSULE | Freq: Three times a day (TID) | ORAL | Status: DC
  Administered 2014-12-17 – 2014-12-23 (×18): 300 mg via ORAL
  Filled 2014-12-17 (×18): qty 1

## 2014-12-17 MED ORDER — SODIUM CHLORIDE 0.9 % IV SOLN
INTRAVENOUS | Status: DC
  Administered 2014-12-17 – 2014-12-20 (×7): via INTRAVENOUS

## 2014-12-17 MED ORDER — DOCUSATE SODIUM 50 MG PO CAPS
250.0000 mg | ORAL_CAPSULE | Freq: Every day | ORAL | Status: DC
  Administered 2014-12-18 – 2014-12-23 (×6): 250 mg via ORAL
  Filled 2014-12-17 (×7): qty 1

## 2014-12-17 MED ORDER — FENTANYL CITRATE (PF) 100 MCG/2ML IJ SOLN
50.0000 ug | Freq: Once | INTRAMUSCULAR | Status: AC
  Administered 2014-12-17: 50 ug via NASAL
  Filled 2014-12-17: qty 2

## 2014-12-17 MED ORDER — MORPHINE SULFATE (PF) 2 MG/ML IV SOLN
2.0000 mg | Freq: Once | INTRAVENOUS | Status: AC
  Administered 2014-12-17: 2 mg via INTRAVENOUS
  Filled 2014-12-17: qty 1

## 2014-12-17 MED ORDER — BACLOFEN 5 MG HALF TABLET
5.0000 mg | ORAL_TABLET | Freq: Three times a day (TID) | ORAL | Status: DC | PRN
  Administered 2014-12-17: 5 mg via ORAL
  Filled 2014-12-17: qty 1

## 2014-12-17 MED ORDER — MORPHINE SULFATE 15 MG PO TABS
15.0000 mg | ORAL_TABLET | Freq: Four times a day (QID) | ORAL | Status: DC | PRN
  Administered 2014-12-17 – 2014-12-23 (×21): 15 mg via ORAL
  Filled 2014-12-17 (×21): qty 1

## 2014-12-17 MED ORDER — SODIUM CHLORIDE 0.9 % IV BOLUS (SEPSIS)
1000.0000 mL | Freq: Once | INTRAVENOUS | Status: AC
  Administered 2014-12-17: 1000 mL via INTRAVENOUS

## 2014-12-17 MED ORDER — VENLAFAXINE HCL 25 MG PO TABS
25.0000 mg | ORAL_TABLET | Freq: Two times a day (BID) | ORAL | Status: DC
  Administered 2014-12-17 – 2014-12-23 (×12): 25 mg via ORAL
  Filled 2014-12-17 (×18): qty 1

## 2014-12-17 MED ORDER — HEPARIN SODIUM (PORCINE) 5000 UNIT/ML IJ SOLN
5000.0000 [IU] | Freq: Three times a day (TID) | INTRAMUSCULAR | Status: DC
  Administered 2014-12-17 – 2014-12-23 (×17): 5000 [IU] via SUBCUTANEOUS
  Filled 2014-12-17 (×14): qty 1

## 2014-12-17 MED ORDER — ONDANSETRON 4 MG PO TBDP
4.0000 mg | ORAL_TABLET | Freq: Three times a day (TID) | ORAL | Status: DC | PRN
  Filled 2014-12-17: qty 1

## 2014-12-17 MED ORDER — FENTANYL CITRATE (PF) 100 MCG/2ML IJ SOLN
50.0000 ug | Freq: Once | INTRAMUSCULAR | Status: AC
  Administered 2014-12-17: 50 ug via INTRAVENOUS
  Filled 2014-12-17: qty 2

## 2014-12-17 MED ORDER — MIDODRINE HCL 5 MG PO TABS
5.0000 mg | ORAL_TABLET | Freq: Three times a day (TID) | ORAL | Status: DC
  Administered 2014-12-17 – 2014-12-18 (×2): 5 mg via ORAL
  Filled 2014-12-17 (×3): qty 1

## 2014-12-17 MED ORDER — TAMSULOSIN HCL 0.4 MG PO CAPS: 0.4000 mg | ORAL_CAPSULE | Freq: Every day | ORAL | Status: DC

## 2014-12-17 MED ORDER — FLUDROCORTISONE ACETATE 0.1 MG PO TABS
0.1000 mg | ORAL_TABLET | Freq: Every day | ORAL | Status: DC
  Administered 2014-12-17 – 2014-12-18 (×2): 0.1 mg via ORAL
  Filled 2014-12-17 (×2): qty 1

## 2014-12-17 MED ORDER — SODIUM CHLORIDE 0.9 % IV SOLN: INTRAVENOUS | Status: DC

## 2014-12-17 MED ORDER — SULFAMETHOXAZOLE-TRIMETHOPRIM 800-160 MG PO TABS
1.0000 | ORAL_TABLET | Freq: Two times a day (BID) | ORAL | Status: DC
  Administered 2014-12-17 – 2014-12-23 (×13): 1 via ORAL
  Filled 2014-12-17 (×13): qty 1

## 2014-12-17 MED ORDER — HYDROCODONE-ACETAMINOPHEN 5-325 MG PO TABS
1.0000 | ORAL_TABLET | ORAL | Status: DC | PRN
Start: 2014-12-17 — End: 2014-12-17
  Administered 2014-12-17: 2 via ORAL
  Filled 2014-12-17: qty 2

## 2014-12-17 MED ORDER — POLYETHYLENE GLYCOL 3350 17 GM/SCOOP PO POWD: 17.0000 g | Freq: Two times a day (BID) | ORAL | Status: DC | PRN

## 2014-12-17 MED ORDER — MELOXICAM 7.5 MG PO TABS
7.5000 mg | ORAL_TABLET | Freq: Every day | ORAL | Status: DC
  Administered 2014-12-17 – 2014-12-23 (×7): 7.5 mg via ORAL
  Filled 2014-12-17 (×7): qty 1

## 2014-12-17 NOTE — H&P (Signed)
Family Medicine Teaching [facility] Admission History and Physical Service Pager: [phone]  Patient name: Jimmy [name] Medical record number: [id number] Date of birth: 1965-08-14 Age: 49 y.o. Gender: male  Primary Care Provider: Lucilla Edin, MD Consultants: None Code Status: Full  Chief Complaint:  Dizziness, sycope  Assessment and Plan: Jimmy [name] is a 49 y.o. male presenting from clinic for orthostatic hypotension and syncopal event . PMH is significant for recent admission for prostatitis, mild adrenal insufficiency, POTS, Herpes zoster ophthalmicus in June, history of spinal stenosis and low back pain s/p surgery  Orthostatic Hypotension- During his last admission he was noted to be mildly adrenally insufficient with a random cortisol of 4.5. He has maintained on fludrocortisone and midodrine with some improvement in his hypotension. On admission to the ED on 8/18 he had + orthostatics with symptoms of dizziness not relieved with fluid bolus - hold flomax, baclofen as can cause hypotension - NS IVF 125cc/hr - CT head to r/o intracranial process - ACTH stim test - attempt transfer to tertiary center in am for endocrinology consult  Chronic low back pain- Stable and well controlled on home regimen of Morphine PO 15 mg q6hr - continue home regimen  Prostatitis- He was still on bactrim as prescribed to continue a 6 week course. He was afebrile on presentation and did not report fevers at home. His low back pain and lower abdominal pain were stable from prior admission - continue bactrim 800-160 BID to complete 6 week course - trend CBCs  Urinary retention with indwelling foley catheter- Reported some irritation at catheter site, but as above no fevers. No drainage, swelling or irritation - Exam not concerning for infection - Attempt void trial  - UA with + nitrite, neg leuk  Concern for weight loss- Stable weight since 8/5 was 195, 193 today - Cotinue to monitor  weights  Constipation- Required an enema during last inpatient admission and has since been taking Miiralax and Colace with stable constipation symptoms.  - continue Miralax/Colace  T2DM- New diagnosis with A1c 6.5 on 12/04/2014.  - SSI - CBG ACHS  FEN/GI: Carb modified diet, NS 125cc/hr Prophylaxis: heparin subq  Disposition: Admit to Family medicine teaching service  History of Present Illness: Jimmy [name] is a 49 y.o. male presenting from clinic after being noted to have hypotension and dizziness with standing. He has had dizziness with sitting and standing since discharge on 8/14. At that time he states he "felt as if he would pass out" but did not. Significantly, he did faint yesterday while sitting. He was sitting on the side of his bed talking to his wife and stopped responding. Wife noted he started to lean forward as if to fall but wife caught him and pushed him back to the bed. This lasted for approximately 10 seconds and after calling his name his eyes "popped right open" and he began speaking as normal. Denies limb jerking, loss of bowel continence. Has an indwelling foley catheter He then went to his PCP this AM for follow up and was noted to have orthostatic hypotension on standing when he  " became extremely lightheaded no blood pressure was able to be obtained and there was not a definite palpable radial pulse". There was concern for an element of adrenal insufficiency as a cause of his hypotension. He was directed to the ED here to consider transfer to [facility] for inpatient endocrinology work up  He was then sent to the Emergency Department here where he was  found to be orthostatic and given fluid bolus with minimal improvement of his symptoms  Currently denies dizziness while laying down. Denies chest pain, SOB associated wit his dizziness. Denies palpitations.     Review Of Systems: Per HPI.    Patient Active Problem List   Diagnosis Date Noted  . Syncope  12/17/2014  . Faintness   . Right hip pain   . Abnormal EKG 12/12/2014  . POTS (postural orthostatic tachycardia syndrome)   . Adrenal insufficiency   . Groin pain   . Orthostatic hypotension   . Lower abdominal pain 12/04/2014  . Weight loss, unintentional   . Absolute anemia   . Prostatitis 12/03/2014  . Abdominal pain 12/02/2014  . Hypokalemia 12/02/2014  . Urinary retention 12/02/2014  . Prostatitis, acute   . Abdominal pain, lower   . Chronic lower back pain    Past Medical History: Past Medical History  Diagnosis Date  . Back pain    Past Surgical History: Past Surgical History  Procedure Laterality Date  . Back surgery     Social History: Social History  Substance Use Topics  . Smoking status: Former Smoker    Quit date: 05/02/2007  . Smokeless tobacco: Never Used  . Alcohol Use: No   Additional social history: Denies etoh, Tobacco use, drug use Please also refer to relevant sections of EMR.  Family History: Family History  Problem Relation Age of Onset  . Heart disease Maternal Grandmother   . Cancer Maternal Grandfather    Allergies and Medications: Allergies  Allergen Reactions  . Antihistamines, Chlorpheniramine-Type Other (See Comments)    Hallucinations  . Other     pyridium  . Phenazopyridine Hives and Other (See Comments)    Pyridium. Dizziness and indigestion   No current facility-administered medications on file prior to encounter.   Current Outpatient Prescriptions on File Prior to Encounter  Medication Sig Dispense Refill  . baclofen (LIORESAL) 10 MG tablet Take 0.5 tablets (5 mg total) by mouth 3 (three) times daily as needed for muscle spasms. 30 each 0  . docusate sodium (COLACE) 250 MG capsule Take 1 capsule (250 mg total) by mouth daily. 10 capsule 0  . fludrocortisone (FLORINEF) 0.1 MG tablet Take 1 tablet (0.1 mg total) by mouth daily. 30 tablet 3  . gabapentin (NEURONTIN) 300 MG capsule Take 1 capsule (300 mg total) by mouth 3  (three) times daily. 30 capsule 3  . meloxicam (MOBIC) 7.5 MG tablet Take 1 tablet (7.5 mg total) by mouth daily. 30 tablet 3  . midodrine (PROAMATINE) 5 MG tablet Take 1 tablet (5 mg total) by mouth 3 (three) times daily with meals. 30 tablet 3  . morphine (MSIR) 15 MG tablet Take 1 tablet (15 mg total) by mouth every 6 (six) hours as needed for severe pain. 30 tablet 0  . polyethylene glycol powder (GLYCOLAX/MIRALAX) powder Take 17 g by mouth 2 (two) times daily as needed. (Patient taking differently: Take 17 g by mouth 2 (two) times daily as needed (constipation). ) 3350 g 1  . sulfamethoxazole-trimethoprim (BACTRIM DS,SEPTRA DS) 800-160 MG per tablet Take 1 tablet by mouth 2 (two) times daily. (Patient taking differently: Take 1 tablet by mouth 2 (two) times daily. 42 day course started 12/03/14 pm) 84 tablet 0  . tamsulosin (FLOMAX) 0.4 MG CAPS capsule Take 1 capsule (0.4 mg total) by mouth daily. 30 capsule 0  . venlafaxine (EFFEXOR) 25 MG tablet Take 1 tablet (25 mg total) by mouth 2 (two)  times daily with a meal. 30 tablet 3  . HYDROcodone-acetaminophen (NORCO/VICODIN) 5-325 MG per tablet Take 1-2 tablets by mouth every 4 (four) hours as needed. (Patient not taking: Reported on 11/30/2014) 20 tablet 0  . ondansetron (ZOFRAN ODT) 4 MG disintegrating tablet Take 1 tablet (4 mg total) by mouth every 8 (eight) hours as needed for nausea or vomiting. (Patient not taking: Reported on 12/04/2014) 20 tablet 0  . phenazopyridine (PYRIDIUM) 100 MG tablet Take 1 tablet (100 mg total) by mouth 3 (three) times daily with meals. (Patient not taking: Reported on 12/17/2014) 10 tablet 0  . traMADol (ULTRAM) 50 MG tablet Take 1 tablet (50 mg total) by mouth every 8 (eight) hours as needed. (Patient not taking: Reported on 12/17/2014) 30 tablet 0    Objective: BP 120/72 mmHg  Pulse 99  Temp(Src) 98.5 F (36.9 C) (Oral)  Resp 17  SpO2 99% Exam: General: NAD, lying in bed comfortably HEENT: PERRL, no cervical  LAD, no oral lesions Neck: supple, non tender Cardiovascular: RRR, no murmurs Respiratory: CTAB, no wheezes, rales, or crackles Abdomen: soft, mildly tender to right lower quadrant, no round or guarding, no organomegally MSK: normal muscle tone Skin: no rashes or lesions GU: normal male genitalia, mild tenderness and foley catheter site but no erythema, lesions or purulent discharge Neuro: no focal deficits Psych: normal mood and affect   Labs and Imaging: CBC BMET   Recent Labs Lab 12/17/14 1515  WBC 3.1*  HGB 11.9*  HCT 34.1*  PLT 171    Recent Labs Lab 12/17/14 1134 12/17/14 1515  NA 131*  --   K 3.9  --   CL 97*  --   CO2 25  --   BUN 8  --   CREATININE 0.85 0.81  GLUCOSE 107*  --   CALCIUM 9.6  --      Velora Heckler, MD  Abram Sander, MD 12/17/2014, 7:42 PM PGY-3, [facility] Intern pager: [phone], text pages welcome

## 2014-12-17 NOTE — ED Notes (Signed)
Pt refused occult blood sample

## 2014-12-17 NOTE — ED Notes (Signed)
Ok for pt to have PO fluids per EDP 

## 2014-12-17 NOTE — H&P (Addendum)
49 Y/O M with pmx of POTS/Orthostatic hypotension,hypokalemia, ?? Adrenal insufficiency,newly diagnosed DM2,chronic back pain, chronic constipation. He was sent to the ED from his PCP's office. He was recently discharged from [facility] few days ago for dizziness and postural hypotension for which he was diagnosed with POTS, he was subsequently discharged home on florinef and midodrine. Today while following up with his PCP after his recent hospitalization, he was found to have low BP with standing as well as increased HR to the 120s. He was subsequently sent to the ED for evaluation. He stated since last admission his symptoms never went away completely. Yesterday her lost consciousness for few second while sitting on the side of his bed. No head injury. He also has hx of chronic back pain for years and urinary retention on going for few weeks or months. He was d/c home from last admission with indwelling urinary catheter. He currently feels less dizzy since he remains flat in bed, he denies headache, no change in vision, no n/v. + hx of constipation, no blood in his stool.  No current facility-administered medications on file prior to encounter.   Current Outpatient Prescriptions on File Prior to Encounter  Medication Sig Dispense Refill  . baclofen (LIORESAL) 10 MG tablet Take 0.5 tablets (5 mg total) by mouth 3 (three) times daily as needed for muscle spasms. 30 each 0  . docusate sodium (COLACE) 250 MG capsule Take 1 capsule (250 mg total) by mouth daily. 10 capsule 0  . fludrocortisone (FLORINEF) 0.1 MG tablet Take 1 tablet (0.1 mg total) by mouth daily. 30 tablet 3  . gabapentin (NEURONTIN) 300 MG capsule Take 1 capsule (300 mg total) by mouth 3 (three) times daily. 30 capsule 3  . meloxicam (MOBIC) 7.5 MG tablet Take 1 tablet (7.5 mg total) by mouth daily. 30 tablet 3  . midodrine (PROAMATINE) 5 MG tablet Take 1 tablet (5 mg total) by mouth 3 (three) times daily with meals. 30 tablet 3  . morphine  (MSIR) 15 MG tablet Take 1 tablet (15 mg total) by mouth every 6 (six) hours as needed for severe pain. 30 tablet 0  . polyethylene glycol powder (GLYCOLAX/MIRALAX) powder Take 17 g by mouth 2 (two) times daily as needed. (Patient taking differently: Take 17 g by mouth 2 (two) times daily as needed (constipation). ) 3350 g 1  . sulfamethoxazole-trimethoprim (BACTRIM DS,SEPTRA DS) 800-160 MG per tablet Take 1 tablet by mouth 2 (two) times daily. (Patient taking differently: Take 1 tablet by mouth 2 (two) times daily. 42 day course started 12/03/14 pm) 84 tablet 0  . tamsulosin (FLOMAX) 0.4 MG CAPS capsule Take 1 capsule (0.4 mg total) by mouth daily. 30 capsule 0  . venlafaxine (EFFEXOR) 25 MG tablet Take 1 tablet (25 mg total) by mouth 2 (two) times daily with a meal. 30 tablet 3  . HYDROcodone-acetaminophen (NORCO/VICODIN) 5-325 MG per tablet Take 1-2 tablets by mouth every 4 (four) hours as needed. (Patient not taking: Reported on 11/30/2014) 20 tablet 0  . ondansetron (ZOFRAN ODT) 4 MG disintegrating tablet Take 1 tablet (4 mg total) by mouth every 8 (eight) hours as needed for nausea or vomiting. (Patient not taking: Reported on 12/04/2014) 20 tablet 0  . phenazopyridine (PYRIDIUM) 100 MG tablet Take 1 tablet (100 mg total) by mouth 3 (three) times daily with meals. (Patient not taking: Reported on 12/17/2014) 10 tablet 0  . traMADol (ULTRAM) 50 MG tablet Take 1 tablet (50 mg total) by mouth every 8 (  eight) hours as needed. (Patient not taking: Reported on 12/17/2014) 30 tablet 0   Past Medical History  Diagnosis Date  . Back pain    Filed Vitals:   12/17/14 1815 12/17/14 1830 12/17/14 1845 12/17/14 1910  BP: 97/54 120/63 137/70 120/72  Pulse: 104 102 98 99  Temp:    98.5 F (36.9 C)  TempSrc:    Oral  Resp: SpO2: 97% 98% 97% 99%     Exam:  Gen: Lying flat in bed, not in distress. Appears angry.  HEENT: EOMI, PERRLA, patchy hypopigmentation over his left forehead. Mild left  conjunctiva injection.  Neuro: Awake and alert, oriented x 3. Gait not assessed. No sensory loss, CN II-XI grossly intact.  Resp: Air entry equal and CTA B/L  Heart: S1 S2 normal, no murmur, RRR.  Abd: Soft, mild generalized tenderness, BS+ and normal.  Ext: No edema.  GU: He declined rectal exam.   A/P: 49 Y/O M with  1. Orthostatic hypotension vs POTS.   Currently stable.   He had full cardiovascular work up during last visit.   EKG done today reviewed with no major changes from previous.   IVF hydration to keep BP up.   Cortisol level checked during last visit was borderline low.   He will benefit from High dose ACTH stimulation test. We will order while he is here.  On Midodrine and Florinef.   Recommended Endocrine evaluation.   Patient currently does not want to stay at [facility], he preferred to be transferred to [facility] if accepted.   He could potentially see an inpatient endocrinologist at [facility].   I agree with transfer. Vitals is currently stable with him lying down.   Fall precaution.      Avoid baclofen and Flomax which can both cause orthostatic hypotension.  2. Syncope: 1 day ago: Cardiovascular or vasovagal vs intracranial pathology.   CT head recommended prior to transfer to [facility], patient declined, he stated he wants everything to be done at [facility].   3. Anemia: Hg dropped from 12 during last admission to 10 today.   + hx of weight loss.   Rectal exam for FOBT testing recommended, patient declined at this time.   As discussed with him, if he continues to lose weight with low Hgb in the setting of chronic constipation and abdominal pain, he should be seen by GI for colonoscopy. He verbalized understanding.   4. Chronic back pain: MRI spine recently done was with no acute changes.   Pain control as needed.   Follow up spine surgeon after hospital d/c.   5. Indwelling catheter: Urinary retention.   Need repeat  UA.   Need urology follow-up.   To consult during admission.   6. New onset DM2: Diet control, he did not require insulin during last admission.    SSI, routine CGB and serum glucose check.   Disposition: Transfer to tertiary institution when accepted for further workup and management.

## 2014-12-17 NOTE — ED Provider Notes (Signed)
CSN: [id number]     Arrival date & time 12/17/14  1016 History   First MD Initiated Contact with Patient 12/17/14 1020     Chief Complaint  Patient presents with  . Hypotension  . Near Syncope     (Consider location/radiation/quality/duration/timing/severity/associated sxs/prior Treatment) HPI Comments: Jimmy [name] is a 49 y.o. male with a PMHx of back pain and recent diagnoses of POTS/primary autonomic insufficiency and DM2 after being admitted under FM teaching service on 8/5-14/16, who presents to the ED sent here from [facility] with complaints of orthostasis and lightheadedness with standing. He reports this is been ongoing since his admission. Chart review reveals that during his admission they obtained cortisol levels which were mildly low but did not improve after administration of styloids, and that he was discharged home with midodrine and fludrocortisone after this helped his orthostatic VS and gave the presumed diagnosis of POTS/primary autonomic insufficiency. He reports he has been taking this regularly with no improvement in his symptoms. He went to [place] urgent care today to see his primary care doctor Dr.Daub where he was found to be orthostatic, blood pressure laying down was 101/67, sitting was 95/64, and standing they were unable to obtain a blood pressure or palpate a definite radial pulse. At that time he was sent for further evaluation. He denies any headache, vision changes, fevers, chills, chest pain, shortness of breath, nausea, vomiting, diarrhea, hematuria, malodorous urine, changes in urination (currently has foley), rectal pain/bleeding, numbness, tingling, weakness, arthralgias, myalgias, or any syncopal episodes today. He has ongoing low abd pain which is improving since his hospital stay, only occurs with palpation to abdomen. CT previously was negative at last ER visit. He also has chronic back pain, unchanged.   Of note, he takes tamulosin at 10am daily, but he  didn't take this medication this morning. He has not yet scheduled his f/up with urology. He has a foley in place due to recurrent urinary retention and dx of prostatitis while admitted. Additionally he takes morphine daily which he took this morning around 4am.  Patient is a 49 y.o. male presenting with near-syncope. The history is provided by the patient. No language interpreter was used.  Near Syncope This is a recurrent problem. The current episode started today. The problem occurs constantly. The problem has been unchanged. Associated symptoms include abdominal pain (improving from last admission, lower abd only with palpation). Pertinent negatives include no arthralgias, chest pain, chills, fever, headaches, myalgias, nausea, neck pain, numbness, urinary symptoms, vertigo, visual change, vomiting or weakness. The symptoms are aggravated by standing. Treatments tried: midodrine and fludrocortisone. The treatment provided no relief.    Past Medical History  Diagnosis Date  . Back pain    Past Surgical History  Procedure Laterality Date  . Back surgery     Family History  Problem Relation Age of Onset  . Heart disease Maternal Grandmother   . Cancer Maternal Grandfather    Social History  Substance Use Topics  . Smoking status: Former Smoker    Quit date: 05/02/2007  . Smokeless tobacco: Never Used  . Alcohol Use: No    Review of Systems  Constitutional: Negative for fever and chills.  Eyes: Negative for visual disturbance.  Respiratory: Negative for shortness of breath.   Cardiovascular: Positive for near-syncope. Negative for chest pain.  Gastrointestinal: Positive for abdominal pain (improving from last admission, lower abd only with palpation). Negative for nausea, vomiting, diarrhea, blood in stool and rectal pain.  Genitourinary: Negative for  hematuria.       No malodorous urine, no changes in urine  Musculoskeletal: Positive for back pain (chronic and unchanged).  Negative for myalgias, arthralgias and neck pain.  Skin: Negative for color change.  Allergic/Immunologic: Positive for immunocompromised state (diabetic (recent diagnosis)).  Neurological: Positive for light-headedness (with standing). Negative for vertigo, syncope, weakness, numbness and headaches.  Hematological: Does not bruise/bleed easily.  Psychiatric/Behavioral: Negative for confusion.   10 Systems reviewed and are negative for acute change except as noted in the HPI.    Allergies  Antihistamines, chlorpheniramine-type; Other; and Phenazopyridine  Home Medications   Prior to Admission medications   Medication Sig Start Date End Date Taking? Authorizing Provider  baclofen (LIORESAL) 10 MG tablet Take 0.5 tablets (5 mg total) by mouth 3 (three) times daily as needed for muscle spasms. 12/13/14   Bonney Aid, MD  docusate sodium (COLACE) 250 MG capsule Take 1 capsule (250 mg total) by mouth daily. 12/04/14   Tishira R Brewington, PA-C  fludrocortisone (FLORINEF) 0.1 MG tablet Take 1 tablet (0.1 mg total) by mouth daily. 12/13/14   Bonney Aid, MD  gabapentin (NEURONTIN) 300 MG capsule Take 1 capsule (300 mg total) by mouth 3 (three) times daily. 12/13/14   Bonney Aid, MD  HYDROcodone-acetaminophen (NORCO/VICODIN) 5-325 MG per tablet Take 1-2 tablets by mouth every 4 (four) hours as needed. Patient not taking: Reported on 11/30/2014 10/21/14   Everlene Farrier, PA-C  ibuprofen (ADVIL,MOTRIN) 200 MG tablet Take 400-600 mg by mouth every 6 (six) hours as needed for moderate pain.     Historical Provider, MD  meloxicam (MOBIC) 7.5 MG tablet Take 1 tablet (7.5 mg total) by mouth daily. 12/13/14   Alyssa A Haney, MD  midodrine (PROAMATINE) 5 MG tablet Take 1 tablet (5 mg total) by mouth 3 (three) times daily with meals. 12/13/14   Bonney Aid, MD  morphine (MSIR) 15 MG tablet Take 1 tablet (15 mg total) by mouth every 6 (six) hours as needed for severe pain. 12/13/14   Alyssa A Haney, MD   ondansetron (ZOFRAN ODT) 4 MG disintegrating tablet Take 1 tablet (4 mg total) by mouth every 8 (eight) hours as needed for nausea or vomiting. Patient not taking: Reported on 12/04/2014 11/30/14   Francee Piccolo, PA-C  phenazopyridine (PYRIDIUM) 100 MG tablet Take 1 tablet (100 mg total) by mouth 3 (three) times daily with meals. Patient not taking: Reported on 12/17/2014 12/03/14   Asiyah Mayra Reel, MD  polyethylene glycol powder (GLYCOLAX/MIRALAX) powder Take 17 g by mouth 2 (two) times daily as needed. Patient taking differently: Take 17 g by mouth 2 (two) times daily as needed (constipation).  12/04/14   Tishira R Brewington, PA-C  sulfamethoxazole-trimethoprim (BACTRIM DS,SEPTRA DS) 800-160 MG per tablet Take 1 tablet by mouth 2 (two) times daily. Patient taking differently: Take 1 tablet by mouth 2 (two) times daily. 42 day course started 12/03/14 pm 12/03/14   Asiyah Mayra Reel, MD  tamsulosin (FLOMAX) 0.4 MG CAPS capsule Take 1 capsule (0.4 mg total) by mouth daily. 12/13/14   Bonney Aid, MD  traMADol (ULTRAM) 50 MG tablet Take 1 tablet (50 mg total) by mouth every 8 (eight) hours as needed. Patient not taking: Reported on 12/17/2014 12/04/14   Tishira R Brewington, PA-C  venlafaxine (EFFEXOR) 25 MG tablet Take 1 tablet (25 mg total) by mouth 2 (two) times daily with a meal. 12/13/14   Alyssa A Haney, MD   BP 118/73 mmHg  Pulse 95  Temp(Src) 98.9 F (37.2 C) (Oral)  Resp 7  SpO2 98% Physical Exam  Constitutional: He is oriented to person, place, and time. Vital signs are normal. He appears well-developed and well-nourished.  Non-toxic appearance. No distress.  Afebrile, nontoxic, NAD  HENT:  Head: Normocephalic and atraumatic.  Mouth/Throat: Oropharynx is clear and moist and mucous membranes are normal.  Eyes: Conjunctivae and EOM are normal. Pupils are equal, round, and reactive to light. Right eye exhibits no discharge. Left eye exhibits no discharge.  Neck: Normal range of  motion. Neck supple.  Cardiovascular: Normal rate, regular rhythm, normal heart sounds and intact distal pulses.  Exam reveals no gallop and no friction rub.   No murmur heard. Pulmonary/Chest: Effort normal and breath sounds normal. No respiratory distress. He has no decreased breath sounds. He has no wheezes. He has no rhonchi. He has no rales.  Abdominal: Soft. Normal appearance and bowel sounds are normal. He exhibits no distension. There is tenderness in the right lower quadrant, suprapubic area and left lower quadrant. There is no rigidity, no rebound, no guarding, no CVA tenderness, no tenderness at McBurney's point and negative Murphy's sign.    Soft, nondistended, +BS throughout, with very mild diffuse lower abd tenderness, no r/g/r, neg murphy's, neg mcburney's, no CVA TTP   Musculoskeletal: Normal range of motion.  MAE x4 Strength and sensation grossly intact Distal pulses intact Unable to assess gait due to pt's lightheadedness with standing/sitting  Neurological: He is alert and oriented to person, place, and time. He has normal strength. No cranial nerve deficit or sensory deficit. Coordination normal. GCS eye subscore is 4. GCS verbal subscore is 5. GCS motor subscore is 6.  CN 2-12 grossly intact A&O x4 GCS 15 Sensation and strength intact Coordination with finger-to-nose WNL Neg pronator drift   Skin: Skin is warm, dry and intact. No rash noted.  Hyperpigmented area over L forehead/eyelid from resolving rash  Psychiatric: He has a normal mood and affect.  Nursing note and vitals reviewed.   ED Course  Procedures (including critical care time) 11:25:36 Orthostatic Vital Signs BH  Orthostatic Lying  - BP- Lying: 129/73 mmHg ; Pulse- Lying: 90  Orthostatic Sitting - BP- Sitting: 88/61 mmHg ; Pulse- Sitting: 121  Orthostatic Standing at 0 minutes - BP- Standing at 0 minutes:  (did not have pt stand d/t orthostatic changes from lying to sitting)       Labs Review Labs  Reviewed  CBC WITH DIFFERENTIAL/PLATELET - Abnormal; Notable for the following:    RBC 3.08 (*)    Hemoglobin 10.4 (*)    HCT 29.5 (*)    All other components within normal limits  BASIC METABOLIC PANEL - Abnormal; Notable for the following:    Sodium 131 (*)    Chloride 97 (*)    Glucose, Bld 107 (*)    All other components within normal limits  URINALYSIS, ROUTINE W REFLEX MICROSCOPIC (NOT AT [facility])  RPR  I-STAT CG4 LACTIC ACID, ED  I-STAT TROPOININ, ED   Results for orders placed or performed during the hospital encounter of 12/04/14  Culture, Urine  Result Value Ref Range   Specimen Description URINE, CLEAN CATCH    Special Requests NONE    Culture 5,000 COLONIES/mL INSIGNIFICANT GROWTH    Report Status 12/06/2014 FINAL   Comprehensive metabolic panel  Result Value Ref Range   Sodium 128 (L) 135 - 145 mmol/L   Potassium 3.5 3.5 - 5.1 mmol/L   Chloride  96 (L) 101 - 111 mmol/L   CO2 21 (L) 22 - 32 mmol/L   Glucose, Bld 139 (H) 65 - 99 mg/dL   BUN <5 (L) 6 - 20 mg/dL   Creatinine, Ser 4.09 0.61 - 1.24 mg/dL   Calcium 9.3 8.9 - 81.1 mg/dL   Total Protein 6.5 6.5 - 8.1 g/dL   Albumin 3.6 3.5 - 5.0 g/dL   AST 20 15 - 41 U/L   ALT 19 17 - 63 U/L   Alkaline Phosphatase 51 38 - 126 U/L   Total Bilirubin 0.7 0.3 - 1.2 mg/dL   GFR calc non Af Amer >60 >60 mL/min   GFR calc Af Amer >60 >60 mL/min   Anion gap 11 5 - 15  CBC WITH DIFFERENTIAL  Result Value Ref Range   WBC 4.2 4.0 - 10.5 K/uL   RBC 3.63 (L) 4.22 - 5.81 MIL/uL   Hemoglobin 12.0 (L) 13.0 - 17.0 g/dL   HCT 91.4 (L) 78.2 - 95.6 %   MCV 93.7 78.0 - 100.0 fL   MCH 33.1 26.0 - 34.0 pg   MCHC 35.3 30.0 - 36.0 g/dL   RDW 21.3 08.6 - 57.8 %   Platelets 224 150 - 400 K/uL   Neutrophils Relative % 53 43 - 77 %   Neutro Abs 2.3 1.7 - 7.7 K/uL   Lymphocytes Relative 36 12 - 46 %   Lymphs Abs 1.5 0.7 - 4.0 K/uL   Monocytes Relative 6 3 - 12 %   Monocytes Absolute 0.3 0.1 - 1.0 K/uL   Eosinophils Relative 4 0 - 5 %    Eosinophils Absolute 0.2 0.0 - 0.7 K/uL   Basophils Relative 1 0 - 1 %   Basophils Absolute 0.0 0.0 - 0.1 K/uL  Urinalysis, Routine w reflex microscopic (not at [facility])  Result Value Ref Range   Color, Urine AMBER (A) YELLOW   APPearance CLEAR CLEAR   Specific Gravity, Urine 1.011 1.005 - 1.030   pH 6.5 5.0 - 8.0   Glucose, UA NEGATIVE NEGATIVE mg/dL   Hgb urine dipstick NEGATIVE NEGATIVE   Bilirubin Urine NEGATIVE NEGATIVE   Ketones, ur NEGATIVE NEGATIVE mg/dL   Protein, ur NEGATIVE NEGATIVE mg/dL   Urobilinogen, UA 1.0 0.0 - 1.0 mg/dL   Nitrite POSITIVE (A) NEGATIVE   Leukocytes, UA NEGATIVE NEGATIVE  Lipase, blood  Result Value Ref Range   Lipase 29 22 - 51 U/L  TSH  Result Value Ref Range   TSH 1.145 0.350 - 4.500 uIU/mL  CBC with Differential/Platelet  Result Value Ref Range   WBC 4.2 4.0 - 10.5 K/uL   RBC 3.69 (L) 4.22 - 5.81 MIL/uL   Hemoglobin 12.7 (L) 13.0 - 17.0 g/dL   HCT 46.9 (L) 62.9 - 52.8 %   MCV 97.6 78.0 - 100.0 fL   MCH 34.4 (H) 26.0 - 34.0 pg   MCHC 35.3 30.0 - 36.0 g/dL   RDW 41.3 24.4 - 01.0 %   Platelets 216 150 - 400 K/uL   Neutrophils Relative % 55 43 - 77 %   Neutro Abs 2.3 1.7 - 7.7 K/uL   Lymphocytes Relative 31 12 - 46 %   Lymphs Abs 1.3 0.7 - 4.0 K/uL   Monocytes Relative 8 3 - 12 %   Monocytes Absolute 0.3 0.1 - 1.0 K/uL   Eosinophils Relative 5 0 - 5 %   Eosinophils Absolute 0.2 0.0 - 0.7 K/uL   Basophils Relative 1 0 - 1 %  Basophils Absolute 0.0 0.0 - 0.1 K/uL  Cortisol-am, blood  Result Value Ref Range   Cortisol - AM 5.0 (L) 6.7 - 22.6 ug/dL  CBC  Result Value Ref Range   WBC 4.0 4.0 - 10.5 K/uL   RBC 3.67 (L) 4.22 - 5.81 MIL/uL   Hemoglobin 12.3 (L) 13.0 - 17.0 g/dL   HCT 16.1 (L) 09.6 - 04.5 %   MCV 95.9 78.0 - 100.0 fL   MCH 33.5 26.0 - 34.0 pg   MCHC 34.9 30.0 - 36.0 g/dL   RDW 40.9 81.1 - 91.4 %   Platelets 218 150 - 400 K/uL  Basic metabolic panel  Result Value Ref Range   Sodium 131 (L) 135 - 145 mmol/L   Potassium  4.2 3.5 - 5.1 mmol/L   Chloride 95 (L) 101 - 111 mmol/L   CO2 24 22 - 32 mmol/L   Glucose, Bld 110 (H) 65 - 99 mg/dL   BUN 11 6 - 20 mg/dL   Creatinine, Ser 7.82 0.61 - 1.24 mg/dL   Calcium 9.5 8.9 - 95.6 mg/dL   GFR calc non Af Amer >60 >60 mL/min   GFR calc Af Amer >60 >60 mL/min   Anion gap 12 5 - 15  Cortisol-am, blood  Result Value Ref Range   Cortisol - AM 4.5 (L) 6.7 - 22.6 ug/dL  Occult blood card to lab, stool RN will collect  Result Value Ref Range   Fecal Occult Bld NEGATIVE NEGATIVE     Imaging Review No results found.  Ct Abdomen Pelvis Wo Contrast  12/02/2014   CLINICAL DATA:  Recurrent abdominal pain.  Subsequent encounter.  EXAM: CT ABDOMEN AND PELVIS WITHOUT CONTRAST  TECHNIQUE: Multidetector CT imaging of the abdomen and pelvis was performed following the standard protocol without IV contrast.  COMPARISON:  11/30/2014  FINDINGS: There are unremarkable unenhanced appearances of the liver, spleen, pancreas, adrenals and kidneys. There is no hydronephrosis. Ureters are unremarkable. Urinary bladder is collapsed around a Foley catheter, grossly unremarkable. There are normal appearances of the stomach, small bowel and colon. The appendix is normal. The abdominal aorta is normal in caliber. There is mild atherosclerotic calcification. There is no adenopathy in the abdomen or pelvis.  No acute inflammatory changes are evident in the abdomen or pelvis. There is no bowel obstruction. There is no extraluminal air.  There is a fat containing umbilical hernia.  There is no significant abnormality in the lower chest.  There is no significant skeletal lesion. Mild degenerative disc changes are present at L4-5.  IMPRESSION: No acute findings are evident in the abdomen or pelvis.  Fat containing umbilical hernia   Electronically Signed   By: Ellery Plunk M.D.   On: 12/02/2014 00:58   Dg Chest 2 View  12/04/2014   CLINICAL DATA:  Shortness of breath, fatigue, weight loss, previous  history of tobacco use.  EXAM: CHEST  2 VIEW  COMPARISON:  Chest x-ray of February 03, 2009  FINDINGS: The lungs are adequately inflated and clear. The heart and pulmonary vascularity are normal. The mediastinum is normal in width. There is no pleural effusion. The bony thorax is unremarkable.  IMPRESSION: There is no active cardiopulmonary disease.   Electronically Signed   By: David  [place] M.D.   On: 12/04/2014 12:51   Mr Lumbar Spine W Wo Contrast  11/30/2014   CLINICAL DATA:  Initial evaluation for acute back pain.  EXAM: MRI LUMBAR SPINE WITHOUT AND WITH CONTRAST  TECHNIQUE: Multiplanar and  multiecho pulse sequences of the lumbar spine were obtained without and with intravenous contrast.  CONTRAST:  20mL MULTIHANCE GADOBENATE DIMEGLUMINE 529 MG/ML IV SOLN  COMPARISON:  Prior MRI from 05/20/2009  FINDINGS: For the purposes of this dictation, the lowest well-formed intervertebral disc spaces presumed to be the L5-S1 level, and there presumed to be 5 lumbar type vertebral bodies.  Transitional lumbosacral anatomy with partial sacralization of the L5 vertebral body. In keeping with prior studies, the transitional segment is labeled L5. Alignment is stable with preservation of the normal lumbar lordosis. Vertebral body heights preserved. No fracture or listhesis. No marrow edema. Diffusely decreased marrow signal intensity is stable from previous studies.  Conus medullaris terminates normally at the T12 level. Signal intensity within the visualized cord is normal. Nerve roots of the cauda equina within normal limits.  Postoperative changes from prior posterior decompression again seen at L4-5.  Diffuse congenital shortening of the pedicles noted.  Paraspinous soft tissues within normal limits. Urinary bladder distension noted.  No abnormal enhancement.  T11-12:  Negative.  T12-L1:  Negative.  L1-2: Tiny central disc protrusion with associated annular fissure. No canal or foraminal stenosis.  L2-3: Mild diffuse  annular disc bulge. Facet and ligamentous hypertrophy. No significant canal or foraminal stenosis.  L3-4: Mild diffuse disc bulge. Superimposed facet and ligamentous hypertrophy. No significant canal or foraminal stenosis.  L4-5: Postoperative changes from prior decompressive wide laminectomy bilaterally. Thecal sac is widely patent without residual stenosis. Probable small amount of enhancing granulation tissue within the left ventral epidural space. Degenerative disc bulging with disc desiccation and associated endplate changes present. No neural impingement. Mild left foraminal narrowing related to disc bulge.  L5-S1: Transitional disc space without disc bulge or disc protrusion. Mild bilateral facet hypertrophy. No stenosis.  IMPRESSION: 1. Overall stable appearance of the lumbar spine with sequelae of prior decompressive laminectomy at L4-5. No recurrent stenosis at this level. 2. Congenital spinal stenosis with superimposed mild multilevel degenerative changes as above, overall relatively similar to prior MRI from 2011. No severe canal stenosis or evidence of cord compression. 3. Distended urinary bladder.   Electronically Signed   By: Rise Mu M.D.   On: 11/30/2014 03:53   Mr Hip Right W Wo Contrast  12/09/2014   CLINICAL DATA:  Back and right hip pain. History of prostatitis. Question hip infection. Subsequent encounter.  EXAM: MRI OF THE RIGHT HIP WITHOUT AND WITH CONTRAST  TECHNIQUE: Multiplanar, multisequence MR imaging was performed both before and after administration of intravenous contrast.  CONTRAST:  18 mL MULTIHANCE GADOBENATE DIMEGLUMINE 529 MG/ML IV SOLN  COMPARISON:  CT abdomen and pelvis 12/02/2014. Lumbar spine MRI 11/30/2014.  FINDINGS: Bones: Bone marrow signal is normal in both hips, throughout the pelvis and visualized femurs. Mild degenerative endplate signal change is noted at L4-5 as seen on the comparison lumbar spine MRI.  Articular cartilage and labrum  Articular  cartilage:  Appears normal.  Labrum:  Intact.  Joint or bursal effusion  Joint effusion:  None.  Bursae:  Unremarkable.  Muscles and tendons  Muscles and tendons:  Intact.  Other findings  Miscellaneous:  None.  IMPRESSION: Negative examination. There is no evidence of septic hip. No abnormality to explain the patient's symptoms is identified.   Electronically Signed   By: Drusilla Kanner M.D.   On: 12/09/2014 07:57   Ct Abdomen Pelvis W Contrast  11/30/2014   CLINICAL DATA:  Generalized weakness for 4 days. Nausea and vomiting. Abdominal pain.  EXAM: CT  ABDOMEN AND PELVIS WITH CONTRAST  TECHNIQUE: Multidetector CT imaging of the abdomen and pelvis was performed using the standard protocol following bolus administration of intravenous contrast.  CONTRAST:  OMNIPAQUE IOHEXOL 300 MG/ML  SOLN  COMPARISON:  None.  FINDINGS: Lower chest:  The included lung bases are clear.  Liver: No focal lesion.  Hepatobiliary: Gallbladder mildly distended without pericholecystic inflammatory change. No common bowel duct dilatation. No intrahepatic biliary dilatation.  Pancreas: Normal.  Spleen: Normal.  Adrenal glands: No nodule.  Kidneys: No hydronephrosis. Mildly prominent distal ureters, likely related to distended bladder. No localizing or focal renal abnormality. Minimally heterogeneous appearance of the cortex of the upper left kidney without discrete lesion.  Stomach/Bowel: Stomach physiologically distended. There are no dilated or thickened small bowel loops. Liquid stool throughout the colon without colonic wall thickening. The appendix is normal.  Vascular/Lymphatic: No retroperitoneal adenopathy. Abdominal aorta is normal in caliber. Mild moderate atherosclerosis of the distal abdominal aorta and iliac branches.  Reproductive: Prostate gland is normal in size.  Bladder: Distended.  Other: No free air, free fluid, or intra-abdominal fluid collection. Fat containing umbilical hernia.  Musculoskeletal: There are no  acute or suspicious osseous abnormalities. Transitional lumbosacral anatomy is seen. Postsurgical and degenerative change at L4-L5.  IMPRESSION: 1. Minimal heterogeneity of the upper pole of the left renal cortex, can be seen in the setting of pyelonephritis. Small cortical lesions too small to characterize could have a similar appearance. Correlation with urinalysis recommended. The urinary bladder is distended. 2. Liquid stool in the colon without colonic wall thickening. 3. Gallbladder appears mildly distended but without CT findings of surrounding inflammation. 4. Fat containing umbilical hernia.   Electronically Signed   By: Rubye Oaks M.D.   On: 11/30/2014 00:51    I have personally reviewed and evaluated these images and lab results as part of my medical decision-making.   EKG Interpretation   Date/Time:  Thursday December 17 2014 10:17:18 EDT Ventricular Rate:  93 PR Interval:  163 QRS Duration: 94 QT Interval:  338 QTC Calculation: 420 R Axis:   79 Text Interpretation:  Sinus rhythm Ventricular premature complex  Borderline T abnormalities, inferior leads but improved from previous   Confirmed by KOHUT  MD, STEPHEN ([id number]) on 12/17/2014 11:29:52 AM      MDM   Final diagnoses:  POTS (postural orthostatic tachycardia syndrome)  Hypotension, postural  Hyponatremia  Anemia, unspecified anemia type    49 y.o. male here with persistent orthostatic hypotension. Recently discharged from hospital on 8/14 with diagnosis of POTS or primary autonomic insufficiency. During admission they performed AM Cortisol level which was slightly low at 4.5 but trial of steroids did not help his orthostasis. Midodrine and fludrocortisone helped his symptoms. He has been taking these but persistently has lightheadedness with standing. Seen at [facility] who transferred him here for persistent orthostasis. Pt mildly tender in lower abd but this he states is chronic and ongoing, has already had CTs performed  during last admission and only noted to have distended bladder, has foley in place. Will obtain labs, EKG, and u/a. Likely admit. Will start fluids and get formal orthostatics here.  11:13 AM Pt requesting pain meds for his chronic back pain. He regularly takes morphine  q6h, last dose 4am therefore he would be due for this but want to hold off on any meds that may affect BP, no IV access yet, will give fentanyl intranasal for now.  12:17 PM Orthostatic VS grossly positive. CBC w/diff showing  Hbg 10.4 which is down from 12.3 during admission, FOBT neg during last admission, unclear where this anemia is coming from, doubt need for repeat rectal exam now. BMP showing persistent stable hyponatremia. Still awaiting U/A. EKG unremarkable aside from occasional PVC. Pt requesting pain meds. When lying flat pt's BP is stable in 110s/70s, will give low dose morphine now. Will consult family medicine for admission.  1:29 PM Family medicine delayed in returning page but Dr. Earlene Plater returned page now and will admit to [facility]. Please see their notes for further documentation of care.   BP 117/72 mmHg  Pulse 92  Temp(Src) 98.9 F (37.2 C) (Oral)  Resp 10  SpO2 100%  Meds ordered this encounter  Medications  . sodium chloride 0.9 % bolus 1,000 mL    Sig:   . fentaNYL (SUBLIMAZE) injection 50 mcg    Sig:   . morphine 2 MG/ML injection 2 mg    Sig:      Zale Marcotte Camprubi-Soms, PA-C 12/17/14 1330  ADDENDUM: Shirlee Latch of family medicine residency service calling back and requesting that we order a CT head to eval for pituitary issues. Also states that potentially a transfer to [facility] for tertiary care would be better for pt since endocrinology won't see pts inpatient here. I previously discussed ER-to-ER transfer with pt and pt and his family did not like this idea. Dr. Beryle Flock states she and her attending will come down to see pt now and discuss this option. Will await for further  instructions.  3:24 PM Dr. Beryle Flock requesting that we transfer pt to [facility] for his needs of inpatient endocrinology. Will cancel CT head given that he will be transferred for further care. Will attempt to contact [facility] admitting teams.  3:41 PM Attempted to call transfer center, no transfer nurses to take my call. They took a message and stated they'd call me back. I called Dr. Beryle Flock back to discuss that this is delaying his care and he needs admission and could be transferred after he's admitted. Will await for return call.  3:58 PM Dr. Beryle Flock working on getting PAL line to contact regarding direct admit. Will sign care out to [facility] who will follow up with transfer vs admission.  Blayze Haen Camprubi-Soms, PA-C 12/17/14 1600  Raeford Razor, MD 12/18/14 [phone]

## 2014-12-17 NOTE — ED Notes (Signed)
Per EMS - pt had near syncopal episode at MD office. Pt recently in hospital for 8 days and dx w/ POTS. Pt positive for orthostatic hypotension at MD office. BP initially around 90 SBP, now 110/70s.

## 2014-12-17 NOTE — Progress Notes (Addendum)
Received page from patient's nurse saying that Jimmy [name] wanted another dose of midodrine since he only got 1 dose today, whereas he normally gets 3. Explained to patient over phone that he had a dose this evening and his recent blood pressures looked good. Explained that this medication is relatively short-acting so missing doses would not put him behind. Asked how he has been taking it at home, and he said his wife helps him with it. Informed patient his next dose was in the morning with breakfast. Patient continued to state that, "if something happens, it's coming back to you." Asked patient if he had any further questions, and he did not.

## 2014-12-17 NOTE — Progress Notes (Signed)
Subjective:  This chart was scribed for Jimmy [name], by Veverly Fells, at [facility].  This patient was seen in room 4 and the patient's care was started at 8:58 AM.    Patient ID: Jimmy [name], male    DOB: 1966/01/10, 49 y.o.   MRN: [id number] Chief Complaint  Patient presents with  . Hospitalization Follow-up    HPI  HPI Comments: Jimmy [name] is a 49 y.o. male who presents to the [facility] for a follow up after his hospitalization four nights ago.  He was diagnosed with POTS and diabetes at that time.  Patient is worried regarding his pulse and blood pressure as he is unable to stand up.  Per wife, they were not able to get his medication right after getting out of the hospital and finally got it two days ago.  She states that the patient passed out last night. She also states that they were told at the hospital that there was something "off" with his adrenal gland. During his hospital visit, he was seen by a cardiologist and had an echocardiogram.     BP lying down:  101/67   Patient Active Problem List   Diagnosis Date Noted  . Right hip pain   . Abnormal EKG 12/12/2014  . POTS (postural orthostatic tachycardia syndrome)   . Adrenal insufficiency   . Groin pain   . Orthostatic hypotension   . Lower abdominal pain 12/04/2014  . Weight loss, unintentional   . Absolute anemia   . Prostatitis 12/03/2014  . Abdominal pain 12/02/2014  . Hypokalemia 12/02/2014  . Urinary retention 12/02/2014  . Prostatitis, acute   . Abdominal pain, lower   . Chronic lower back pain    Past Medical History  Diagnosis Date  . Back pain    Past Surgical History  Procedure Laterality Date  . Back surgery     Allergies  Allergen Reactions  . Antihistamines, Chlorpheniramine-Type Other (See Comments)    Hallucinations  . Other     pyridium  . Phenazopyridine     pyridium   Prior to Admission medications   Medication Sig Start  Date End Date Taking? Authorizing Provider  baclofen (LIORESAL) 10 MG tablet Take 0.5 tablets (5 mg total) by mouth 3 (three) times daily as needed for muscle spasms. 12/13/14  Yes Alyssa A Kennon Rounds, [name]  docusate sodium (COLACE) 250 MG capsule Take 1 capsule (250 mg total) by mouth daily. 12/04/14  Yes Tishira R Brewington, PA-C  fludrocortisone (FLORINEF) 0.1 MG tablet Take 1 tablet (0.1 mg total) by mouth daily. 12/13/14  Yes Alyssa A Kennon Rounds, [name]  gabapentin (NEURONTIN) 300 MG capsule Take 1 capsule (300 mg total) by mouth 3 (three) times daily. 12/13/14  Yes Alyssa A Haney, [name]  meloxicam (MOBIC) 7.5 MG tablet Take 1 tablet (7.5 mg total) by mouth daily. 12/13/14  Yes Alyssa A Haney, [name]  midodrine (PROAMATINE) 5 MG tablet Take 1 tablet (5 mg total) by mouth 3 (three) times daily with meals. 12/13/14  Yes Alyssa A Kennon Rounds, [name]  morphine (MSIR) 15 MG tablet Take 1 tablet (15 mg total) by mouth every 6 (six) hours as needed for severe pain. 12/13/14  Yes Alyssa A Haney, [name]  polyethylene glycol powder (GLYCOLAX/MIRALAX) powder Take 17 g by mouth 2 (two) times daily as needed. Patient taking differently: Take 17 g by mouth 2 (two) times daily as needed (constipation).  12/04/14  Yes Tishira R Brewington, PA-C  sulfamethoxazole-trimethoprim (BACTRIM DS,SEPTRA DS) 800-160 MG per tablet Take 1 tablet by mouth 2 (two) times daily. Patient taking differently: Take 1 tablet by mouth 2 (two) times daily. 42 day course started 12/03/14 pm 12/03/14  Yes Asiyah Mayra Reel, [name]  tamsulosin (FLOMAX) 0.4 MG CAPS capsule Take 1 capsule (0.4 mg total) by mouth daily. 12/13/14  Yes Bonney Aid, [name]  venlafaxine (EFFEXOR) 25 MG tablet Take 1 tablet (25 mg total) by mouth 2 (two) times daily with a meal. 12/13/14  Yes Alyssa A Haney, [name]  HYDROcodone-acetaminophen (NORCO/VICODIN) 5-325 MG per tablet Take 1-2 tablets by mouth every 4 (four) hours as needed. Patient not taking: Reported on 11/30/2014 10/21/14   Everlene Farrier, PA-C  ibuprofen  (ADVIL,MOTRIN) 200 MG tablet Take 400-600 mg by mouth every 6 (six) hours as needed for moderate pain.     Historical Provider, [name]  ondansetron (ZOFRAN ODT) 4 MG disintegrating tablet Take 1 tablet (4 mg total) by mouth every 8 (eight) hours as needed for nausea or vomiting. Patient not taking: Reported on 12/04/2014 11/30/14   Francee Piccolo, PA-C  phenazopyridine (PYRIDIUM) 100 MG tablet Take 1 tablet (100 mg total) by mouth 3 (three) times daily with meals. Patient not taking: Reported on 12/17/2014 12/03/14   Asiyah Mayra Reel, [name]  traMADol (ULTRAM) 50 MG tablet Take 1 tablet (50 mg total) by mouth every 8 (eight) hours as needed. Patient not taking: Reported on 12/17/2014 12/04/14   Hinton Rao, PA-C   Social History   Social History  . Marital Status: Married    Spouse Name: N/A  . Number of Children: N/A  . Years of Education: N/A   Occupational History  . Not on file.   Social History Main Topics  . Smoking status: Former Smoker    Quit date: 05/02/2007  . Smokeless tobacco: Never Used  . Alcohol Use: No  . Drug Use: No  . Sexual Activity: Yes   Other Topics Concern  . Not on file   Social History Narrative     Review of Systems  Constitutional: Negative for fever and chills.  Eyes: Negative for pain and redness.  Gastrointestinal: Negative for nausea and vomiting.  Musculoskeletal: Negative for neck pain and neck stiffness.  Neurological: Positive for dizziness and syncope.       Objective:   Physical Exam  EYES: EOMI/PERRL NECK: supple no meningeal signs SPINE/BACK:entire spine nontender LUNGS: Lungs are clear to auscultation bilaterally, no apparent distress GU:no cva tenderness NEURO: Pt is awake/alert/appropriate, moves all extremitiesx4.  No facial droop.   EXTREMITIES: pulses normal/equal, full ROM PSYCH: no abnormalities of mood noted, alert and oriented to situation Abd patient has an indwelling catheter. He is extremely tender right lower  abdomen. Filed Vitals:   12/17/14 0833  BP: 92/60  Pulse: 130  Temp: 98.2 F (36.8 C)  TempSrc: Oral  Resp: 18  Height: 5\' 10"  (1.778 m)  Weight: 193 lb (87.544 kg)  SpO2: 95%       Assessment & Plan:  Patient with a comp carrier history of abdominal pain suspected prostatitis urinary tract infection with indwelling catheter and diagnosis of pots disease. He has not been stable at home. He is also suspected to have adrenal insufficiency. He has had the syncopal episode yesterday today on arrival his heart rate was 1:30 with a blood pressure of 90. When being checked in room for orthostasis on standing he became extremely lightheaded no blood pressure was able to be obtained and  there was not a definite palpable radial pulse. The symptoms resolved on lying back down. Patient being sent back to [facility] ER. I have notified the family medicine service. I'm concerned that he may need tertiary care center evaluation since he continues to be extremely symptomatic on his current medication regimen.I personally performed the services described in this documentation, which was scribed in my presence. The recorded information has been reviewed and is accurate.  Earl Lites, [name]

## 2014-12-17 NOTE — Consult Note (Signed)
Family Medicine Teaching Service Consult Service Pager: [phone]  Patient name: Jimmy [name] Medical record number: [id number] Date of birth: [birth year] Age: 49 y.o. Gender: male  Primary Care Provider: Lucilla Edin, MD Consultants: None Code Status: Full  Chief Complaint:  Dizziness, sycope  Assessment and Plan: Jimmy [name] is a 49 y.o. male presenting from clinic for orthostatic hypotension and syncopal event . PMH is significant for recent admission for prostatitis, mild adrenal insufficiency, POTS, Herpes zoster ophthalmicus in June, history of spinal stenosis and low back pain s/p surgery  Orthostatic Hypotension- During his last admission he was noted to be mildly adrenally insufficient with a random cortisol of 4.5. He has maintained on fludrocortisone and midodrine with some improvement in his hypotension. On admission to the ED on 8/18 he had + orthostatics with symptoms of dizziness not relieved with fluid bolus - hold flomax, baclofen as can cause hypotension - NS IVF 125cc/hr - CT head to r/o intracranial process - ACTH stim test - attempt transfer to tertiary center in am for endocrinology consult  Chronic low back pain- Stable and well controlled on home regimen of Morphine PO 15 mg q6hr - continue home regimen  Prostatitis- He was still on bactrim as prescribed to continue a 6 week course. He was afebrile on presentation and did not report fevers at home. His low back pain and lower abdominal pain were stable from prior admission - continue bactrim 800-160 BID to complete 6 week course - trend CBCs  Urinary retention with indwelling foley catheter- Reported some irritation at catheter site, but as above no fevers. No drainage, swelling or irritation - Exam not concerning for infection - Attempt void trial  - UA with + nitrite, neg leuk  Concern for weight loss- Stable weight since 8/5 was 195, 193 today - Cotinue to monitor weights  Constipation- Required  an enema during last inpatient admission and has since been taking Miiralax and Colace with stable constipation symptoms.  - continue Miralax/Colace  T2DM- New diagnosis with A1c 6.5 on 12/04/2014.  - SSI - CBG ACHS  FEN/GI: Carb modified diet, NS 125cc/hr Prophylaxis: heparin subq  Disposition: Admit to Family medicine teaching service  History of Present Illness: Jimmy [name] is a 49 y.o. male presenting from clinic after being noted to have hypotension and dizziness with standing. He has had dizziness with sitting and standing since discharge on 8/14. At that time he states he "felt as if he would pass out" but did not. Significantly, he did faint yesterday while sitting. He was sitting on the side of his bed talking to his wife and stopped responding. Wife noted he started to lean forward as if to fall but wife caught him and pushed him back to the bed. This lasted for approximately 10 seconds and after calling his name his eyes "popped right open" and he began speaking as normal. Denies limb jerking, loss of bowel continence. Has an indwelling foley catheter He then went to his PCP this AM for follow up and was noted to have orthostatic hypotension on standing when he  " became extremely lightheaded no blood pressure was able to be obtained and there was not a definite palpable radial pulse". There was concern for an element of adrenal insufficiency as a cause of his hypotension. He was directed to the ED here to consider transfer to [facility] for inpatient endocrinology work up  He was then sent to the Emergency Department here where he was found to be orthostatic  and given fluid bolus with minimal improvement of his symptoms  Currently denies dizziness while laying down. Denies chest pain, SOB associated wit his dizziness. Denies palpitations.     Review Of Systems: Per HPI.    Patient Active Problem List   Diagnosis Date Noted  . Right hip pain   . Abnormal EKG 12/12/2014  .  POTS (postural orthostatic tachycardia syndrome)   . Adrenal insufficiency   . Groin pain   . Orthostatic hypotension   . Lower abdominal pain 12/04/2014  . Weight loss, unintentional   . Absolute anemia   . Prostatitis 12/03/2014  . Abdominal pain 12/02/2014  . Hypokalemia 12/02/2014  . Urinary retention 12/02/2014  . Prostatitis, acute   . Abdominal pain, lower   . Chronic lower back pain    Past Medical History: Past Medical History  Diagnosis Date  . Back pain    Past Surgical History: Past Surgical History  Procedure Laterality Date  . Back surgery     Social History: Social History  Substance Use Topics  . Smoking status: Former Smoker    Quit date: 05/02/2007  . Smokeless tobacco: Never Used  . Alcohol Use: No   Additional social history: Denies etoh, Tobacco use, drug use Please also refer to relevant sections of EMR.  Family History: Family History  Problem Relation Age of Onset  . Heart disease Maternal Grandmother   . Cancer Maternal Grandfather    Allergies and Medications: Allergies  Allergen Reactions  . Antihistamines, Chlorpheniramine-Type Other (See Comments)    Hallucinations  . Other     pyridium  . Phenazopyridine Hives and Other (See Comments)    Pyridium. Dizziness and indigestion   No current facility-administered medications on file prior to encounter.   Current Outpatient Prescriptions on File Prior to Encounter  Medication Sig Dispense Refill  . baclofen (LIORESAL) 10 MG tablet Take 0.5 tablets (5 mg total) by mouth 3 (three) times daily as needed for muscle spasms. 30 each 0  . docusate sodium (COLACE) 250 MG capsule Take 1 capsule (250 mg total) by mouth daily. 10 capsule 0  . fludrocortisone (FLORINEF) 0.1 MG tablet Take 1 tablet (0.1 mg total) by mouth daily. 30 tablet 3  . gabapentin (NEURONTIN) 300 MG capsule Take 1 capsule (300 mg total) by mouth 3 (three) times daily. 30 capsule 3  . meloxicam (MOBIC) 7.5 MG tablet Take 1  tablet (7.5 mg total) by mouth daily. 30 tablet 3  . midodrine (PROAMATINE) 5 MG tablet Take 1 tablet (5 mg total) by mouth 3 (three) times daily with meals. 30 tablet 3  . morphine (MSIR) 15 MG tablet Take 1 tablet (15 mg total) by mouth every 6 (six) hours as needed for severe pain. 30 tablet 0  . polyethylene glycol powder (GLYCOLAX/MIRALAX) powder Take 17 g by mouth 2 (two) times daily as needed. (Patient taking differently: Take 17 g by mouth 2 (two) times daily as needed (constipation). ) 3350 g 1  . sulfamethoxazole-trimethoprim (BACTRIM DS,SEPTRA DS) 800-160 MG per tablet Take 1 tablet by mouth 2 (two) times daily. (Patient taking differently: Take 1 tablet by mouth 2 (two) times daily. 42 day course started 12/03/14 pm) 84 tablet 0  . tamsulosin (FLOMAX) 0.4 MG CAPS capsule Take 1 capsule (0.4 mg total) by mouth daily. 30 capsule 0  . venlafaxine (EFFEXOR) 25 MG tablet Take 1 tablet (25 mg total) by mouth 2 (two) times daily with a meal. 30 tablet 3  . HYDROcodone-acetaminophen (NORCO/VICODIN)  5-325 MG per tablet Take 1-2 tablets by mouth every 4 (four) hours as needed. (Patient not taking: Reported on 11/30/2014) 20 tablet 0  . ondansetron (ZOFRAN ODT) 4 MG disintegrating tablet Take 1 tablet (4 mg total) by mouth every 8 (eight) hours as needed for nausea or vomiting. (Patient not taking: Reported on 12/04/2014) 20 tablet 0  . phenazopyridine (PYRIDIUM) 100 MG tablet Take 1 tablet (100 mg total) by mouth 3 (three) times daily with meals. (Patient not taking: Reported on 12/17/2014) 10 tablet 0  . traMADol (ULTRAM) 50 MG tablet Take 1 tablet (50 mg total) by mouth every 8 (eight) hours as needed. (Patient not taking: Reported on 12/17/2014) 30 tablet 0    Objective: BP 127/76 mmHg  Pulse 98  Temp(Src) 98.9 F (37.2 C) (Oral)  Resp 23  SpO2 98% Exam: General: NAD, lying in bed comfortably HEENT: PERRL, no cervical LAD, no oral lesions Neck: supple, non tender Cardiovascular: RRR, no  murmurs Respiratory: CTAB, no wheezes, rales, or crackles Abdomen: soft, mildly tender to right lower quadrant, no round or guarding, no organomegally MSK: normal muscle tone Skin: no rashes or lesions GU: normal male genitalia, mild tenderness and foley catheter site but no erythema, lesions or purulent discharge Neuro: no focal deficits Psych: normal mood and affect   Labs and Imaging: CBC BMET   Recent Labs Lab 12/17/14 1134  WBC 4.9  HGB 10.4*  HCT 29.5*  PLT 221    Recent Labs Lab 12/17/14 1134  NA 131*  K 3.9  CL 97*  CO2 25  BUN 8  CREATININE 0.85  GLUCOSE 107*  CALCIUM 9.6       Bonney Aid, MD 12/17/2014, 2:28 PM PGY-2,  Family Medicine FPTS Intern pager: [phone], text pages welcome  Beverely Low, MD, MPH [facility] Family Medicine PGY-3 12/17/2014 6:49 PM

## 2014-12-17 NOTE — ED Notes (Signed)
This RN unsuccessfully attempted to start IV.

## 2014-12-18 ENCOUNTER — Telehealth: Payer: Self-pay

## 2014-12-18 DIAGNOSIS — Z7952 Long term (current) use of systemic steroids: Secondary | ICD-10-CM | POA: Diagnosis not present

## 2014-12-18 DIAGNOSIS — R42 Dizziness and giddiness: Secondary | ICD-10-CM | POA: Diagnosis present

## 2014-12-18 DIAGNOSIS — Z79899 Other long term (current) drug therapy: Secondary | ICD-10-CM | POA: Diagnosis not present

## 2014-12-18 DIAGNOSIS — Z79891 Long term (current) use of opiate analgesic: Secondary | ICD-10-CM | POA: Diagnosis not present

## 2014-12-18 DIAGNOSIS — G894 Chronic pain syndrome: Secondary | ICD-10-CM | POA: Diagnosis not present

## 2014-12-18 DIAGNOSIS — I951 Orthostatic hypotension: Secondary | ICD-10-CM | POA: Insufficient documentation

## 2014-12-18 DIAGNOSIS — Z960 Presence of urogenital implants: Secondary | ICD-10-CM | POA: Diagnosis present

## 2014-12-18 DIAGNOSIS — R000 Tachycardia, unspecified: Secondary | ICD-10-CM | POA: Diagnosis not present

## 2014-12-18 DIAGNOSIS — K5900 Constipation, unspecified: Secondary | ICD-10-CM | POA: Diagnosis present

## 2014-12-18 DIAGNOSIS — M545 Low back pain: Secondary | ICD-10-CM | POA: Diagnosis present

## 2014-12-18 DIAGNOSIS — I498 Other specified cardiac arrhythmias: Secondary | ICD-10-CM | POA: Diagnosis present

## 2014-12-18 DIAGNOSIS — N419 Inflammatory disease of prostate, unspecified: Secondary | ICD-10-CM | POA: Diagnosis present

## 2014-12-18 DIAGNOSIS — B0230 Zoster ocular disease, unspecified: Secondary | ICD-10-CM | POA: Diagnosis present

## 2014-12-18 DIAGNOSIS — Z87891 Personal history of nicotine dependence: Secondary | ICD-10-CM | POA: Diagnosis not present

## 2014-12-18 DIAGNOSIS — G8929 Other chronic pain: Secondary | ICD-10-CM | POA: Diagnosis present

## 2014-12-18 DIAGNOSIS — M4800 Spinal stenosis, site unspecified: Secondary | ICD-10-CM | POA: Diagnosis present

## 2014-12-18 DIAGNOSIS — Z791 Long term (current) use of non-steroidal anti-inflammatories (NSAID): Secondary | ICD-10-CM | POA: Diagnosis not present

## 2014-12-18 DIAGNOSIS — D649 Anemia, unspecified: Secondary | ICD-10-CM | POA: Diagnosis not present

## 2014-12-18 DIAGNOSIS — R55 Syncope and collapse: Secondary | ICD-10-CM | POA: Diagnosis not present

## 2014-12-18 DIAGNOSIS — E871 Hypo-osmolality and hyponatremia: Secondary | ICD-10-CM | POA: Diagnosis not present

## 2014-12-18 DIAGNOSIS — Z888 Allergy status to other drugs, medicaments and biological substances status: Secondary | ICD-10-CM | POA: Diagnosis not present

## 2014-12-18 DIAGNOSIS — I4891 Unspecified atrial fibrillation: Secondary | ICD-10-CM | POA: Diagnosis present

## 2014-12-18 DIAGNOSIS — E119 Type 2 diabetes mellitus without complications: Secondary | ICD-10-CM | POA: Diagnosis present

## 2014-12-18 DIAGNOSIS — R339 Retention of urine, unspecified: Secondary | ICD-10-CM | POA: Diagnosis present

## 2014-12-18 DIAGNOSIS — R634 Abnormal weight loss: Secondary | ICD-10-CM | POA: Diagnosis present

## 2014-12-18 DIAGNOSIS — E2740 Unspecified adrenocortical insufficiency: Secondary | ICD-10-CM | POA: Diagnosis present

## 2014-12-18 DIAGNOSIS — R630 Anorexia: Secondary | ICD-10-CM | POA: Diagnosis present

## 2014-12-18 LAB — COMPREHENSIVE METABOLIC PANEL
ALT: 23 U/L (ref 17–63)
AST: 20 U/L (ref 15–41)
Albumin: 2.8 g/dL — ABNORMAL LOW (ref 3.5–5.0)
Alkaline Phosphatase: 50 U/L (ref 38–126)
Anion gap: 9 (ref 5–15)
BUN: 9 mg/dL (ref 6–20)
CO2: 22 mmol/L (ref 22–32)
Calcium: 8.7 mg/dL — ABNORMAL LOW (ref 8.9–10.3)
Chloride: 101 mmol/L (ref 101–111)
Creatinine, Ser: 0.89 mg/dL (ref 0.61–1.24)
GFR calc Af Amer: >60 mL/min (ref >60)
GFR calc non Af Amer: >60 mL/min (ref >60)
Glucose, Bld: 106 mg/dL — ABNORMAL HIGH (ref 65–99)
Potassium: 3.7 mmol/L (ref 3.5–5.1)
Sodium: 132 mmol/L — ABNORMAL LOW (ref 135–145)
Total Bilirubin: 0.4 mg/dL (ref 0.3–1.2)
Total Protein: 5.8 g/dL — ABNORMAL LOW (ref 6.5–8.1)

## 2014-12-18 LAB — ACTH STIMULATION, 3 TIME POINTS
Cortisol, 30 Min: 17.6 ug/dL
Cortisol, 60 Min: 23.4 ug/dL
Cortisol, Base: 4.4 ug/dL

## 2014-12-18 LAB — RPR: RPR Ser Ql: NONREACTIVE

## 2014-12-18 LAB — CBC
HCT: 29.6 % — ABNORMAL LOW (ref 39.0–52.0)
Hemoglobin: 10.3 g/dL — ABNORMAL LOW (ref 13.0–17.0)
MCH: 33.9 pg (ref 26.0–34.0)
MCHC: 34.8 g/dL (ref 30.0–36.0)
MCV: 97.4 fL (ref 78.0–100.0)
Platelets: 193 10*3/uL (ref 150–400)
RBC: 3.04 MIL/uL — ABNORMAL LOW (ref 4.22–5.81)
RDW: 12.3 % (ref 11.5–15.5)
WBC: 2.8 10*3/uL — ABNORMAL LOW (ref 4.0–10.5)

## 2014-12-18 MED ORDER — COSYNTROPIN 0.25 MG IJ SOLR
0.2500 mg | Freq: Once | INTRAMUSCULAR | Status: AC
  Administered 2014-12-18: 0.25 mg via INTRAVENOUS
  Filled 2014-12-18: qty 0.25

## 2014-12-18 MED ORDER — FLUDROCORTISONE ACETATE 0.1 MG PO TABS
0.1000 mg | ORAL_TABLET | Freq: Once | ORAL | Status: AC
  Administered 2014-12-18: 0.1 mg via ORAL
  Filled 2014-12-18 (×2): qty 1

## 2014-12-18 MED ORDER — COSYNTROPIN 0.25 MG IJ SOLR
0.2500 mg | Freq: Once | INTRAMUSCULAR | Status: DC
  Filled 2014-12-18: qty 0.25

## 2014-12-18 MED ORDER — HYDROCORTISONE 10 MG PO TABS
10.0000 mg | ORAL_TABLET | Freq: Every day | ORAL | Status: DC
  Administered 2014-12-20 – 2014-12-23 (×3): 10 mg via ORAL
  Filled 2014-12-18 (×7): qty 1

## 2014-12-18 MED ORDER — CORTISONE ACETATE 25 MG PO TABS
25.0000 mg | ORAL_TABLET | Freq: Every day | ORAL | Status: DC
  Administered 2014-12-18: 25 mg via ORAL
  Filled 2014-12-18 (×3): qty 1

## 2014-12-18 MED ORDER — HYDROCORTISONE 5 MG PO TABS
5.0000 mg | ORAL_TABLET | Freq: Two times a day (BID) | ORAL | Status: DC
  Administered 2014-12-18 – 2014-12-23 (×10): 5 mg via ORAL
  Filled 2014-12-18 (×14): qty 1

## 2014-12-18 MED ORDER — FLUDROCORTISONE ACETATE 0.1 MG PO TABS
0.2000 mg | ORAL_TABLET | Freq: Every day | ORAL | Status: DC
  Administered 2014-12-19 – 2014-12-22 (×4): 0.2 mg via ORAL
  Filled 2014-12-18 (×4): qty 2

## 2014-12-18 NOTE — Telephone Encounter (Signed)
Pt's wife called with some questions for Dr. Cleta Alberts in regards to pt's treatment here and the reasons for him being sent out by EMS on [phone]

## 2014-12-18 NOTE — Progress Notes (Signed)
Family Medicine Teaching Service Daily Progress Note Intern Pager: [phone]  Patient name: Jimmy [name] Medical record number: [id number] Date of birth: January 29, 1966 Age: 49 y.o. Gender: male  Primary Care Provider: Lucilla Edin, MD Consultants: none Code Status: full  Pt Overview and Major Events to Date:  8/18: Admit for orthostatic hypotension and syncopal event; CT head negative    Assessment and Plan: Jimmy [name] is a 49 y.o. male presenting from clinic for orthostatic hypotension and syncopal event . PMH is significant for recent admission for prostatitis, mild adrenal insufficiency, POTS, Herpes zoster ophthalmicus in June, history of spinal stenosis and low back pain s/p surgery  Orthostatic Hypotension- During his last admission he was noted to be mildly adrenally insufficient with a random cortisol of 4.5. He has maintained on fludrocortisone and midodrine with some improvement in his hypotension. On admission to the ED on 8/18 he had + orthostatics with symptoms of dizziness not relieved with fluid bolus. CT head performed to r/o intracranial process (8/18): No evidence of acute infarction, hemorrhage, hydrocephalus, or mass lesion/mass effect. ACTH stim test performed.  - NS IVF 125cc/hr and continuing midodrine  - awaiting call back from [facility] medicine for potential transfer of patient to tertiary center; [name] unable to accept    Chronic low back pain- Stable and well controlled on home regimen of Morphine PO 15 mg q6hr - continue home regimen  Prostatitis- He was still on bactrim as prescribed to continue a 6 week course. He was afebrile on presentation and did not report fevers at home. His low back pain and lower abdominal pain were stable from prior admission - continue bactrim 800-160 BID to complete 6 week course - trend CBCs  Urinary retention with indwelling foley catheter- Reported some irritation at catheter site, but as above no fevers. No drainage,  swelling or irritation - Exam not concerning for infection - Attempt void trial  - UA with + nitrite, neg leuk  Concern for weight loss- Stable weight since 8/5 was 195, 193 today - Cotinue to monitor weights  Constipation- Required an enema during last inpatient admission and has since been taking Miiralax and Colace with stable constipation symptoms.  - continue Miralax/Colace  T2DM- New diagnosis with A1c 6.5 on 12/04/2014.  - SSI - CBG ACHS  FEN/GI: Carb modified diet, NS 125cc/hr Prophylaxis: heparin subq  Disposition: stable   Subjective:  Only complaint is of chronic back pain. Wishing to be transferred to another hospital.   Objective: Temp:  [97.9 F (36.6 C)-98.9 F (37.2 C)] 97.9 F (36.6 C) (08/19 0554) Pulse Rate:  [83-130] 92 (08/19 0554) Resp:  [7-24] 18 (08/19 0554) BP: (92-146)/(44-98) 113/68 mmHg (08/19 0554) SpO2:  [95 %-100 %] 99 % (08/19 0554) Weight:  [193 lb (87.544 kg)] 193 lb (87.544 kg) (08/18 2956) Physical Exam: [name]: NAD, lying in bed, wife at bedside Cardiovascular: RRR, no murmurs appreciated Respiratory: CTAB, no wheezes, rales or crackles Abdomen: soft, ND, NT Extremities: no edema in LE   Laboratory:  Recent Labs Lab 12/17/14 1134 12/17/14 1515  WBC 4.9 3.1*  HGB 10.4* 11.9*  HCT 29.5* 34.1*  PLT 221 171    Recent Labs Lab 12/17/14 1134 12/17/14 1515  NA 131*  --   K 3.9  --   CL 97*  --   CO2 25  --   BUN 8  --   CREATININE 0.85 0.81  CALCIUM 9.6  --   GLUCOSE 107*  --    ACTH  Stimulation Test  Cortisol, Base ug/dL 4.4   Comments: NO NORMAL RANGE ESTABLISHED FOR THIS TEST   Cortisol, 30 Min ug/dL 16.1   Cortisol, 60 Min ug/dL 09.6          Imaging/Diagnostic Tests: Ct Head Wo Contrast  12/17/2014   CLINICAL DATA:  Syncope.  EXAM: CT HEAD WITHOUT CONTRAST  TECHNIQUE: Contiguous axial images were obtained from the base of the skull through the vertex without intravenous contrast.  COMPARISON:   11/27/2005  FINDINGS: Skull and Sinuses:Chronic skin based thickening at the central forehead.  No acute fracture.  Orbits: No acute abnormality.  Brain: Negative. No evidence of acute infarction, hemorrhage, hydrocephalus, or mass lesion/mass effect.  IMPRESSION: Negative head CT.   Electronically Signed   By: Marnee Spring M.D.   On: 12/17/2014 22:26    Arvilla Market, DO 12/18/2014, 8:21 AM PGY-1, [facility] Family Medicine FPTS Intern pager: [phone], text pages welcome

## 2014-12-18 NOTE — Progress Notes (Addendum)
Nutrition Initial Assessment  DOCUMENTATION CODES:   Not applicable  INTERVENTION:  Patient lacks appetite, not interested in supplements, will not provide at this time.   NUTRITION DIAGNOSIS:   Inadequate oral intake related to poor appetite as evidenced by per patient/family report.   GOAL:   Patient will meet greater than or equal to 90% of their needs    MONITOR:   PO intake, Labs, Weight trends, Skin, I & O's  REASON FOR ASSESSMENT:   Malnutrition Screening Tool    ASSESSMENT:    Follow up assessment. Pt presents with lower back pain, syncope, orthostatic hypotension s/p spinal stenosis x2. Lacks appetite, no explanation. Presents mild muscle wasting/fat loss at temples, upper extremities, dorsal hand. Monitor for future physical changes. Eating poorly at meals 8/19, 0% 0% 25%   Diet Order:  Diet regular Room service appropriate?: Yes; Fluid consistency:: Thin  Skin:  Reviewed, no issues  Last BM:  12/17/14  Height:   Ht Readings from Last 1 Encounters:  12/17/14  (1.778 m)    Weight:   Wt Readings from Last 1 Encounters:  12/17/14 193 lb (87.544 kg)    Ideal Body Weight:  75.45 kg  BMI:  There is no weight on file to calculate BMI.  Estimated Nutritional Needs:   Kcal:  2000-2200  Protein:  100-110  Fluid:  >/= 2L  EDUCATION NEEDS:   No education needs identified at this time  Desma Maxim, MS, RD LDN

## 2014-12-18 NOTE — Telephone Encounter (Signed)
Tried to call patient. There was no answer.  Voice mail not set up yet.

## 2014-12-19 DIAGNOSIS — G894 Chronic pain syndrome: Secondary | ICD-10-CM

## 2014-12-19 LAB — CBC WITH DIFFERENTIAL/PLATELET
Basophils Absolute: 0 10*3/uL (ref 0.0–0.1)
Basophils Relative: 1 % (ref 0–1)
Eosinophils Absolute: 0.2 10*3/uL (ref 0.0–0.7)
Eosinophils Relative: 5 % (ref 0–5)
HCT: 30.5 % — ABNORMAL LOW (ref 39.0–52.0)
Hemoglobin: 10.9 g/dL — ABNORMAL LOW (ref 13.0–17.0)
Lymphocytes Relative: 19 % (ref 12–46)
Lymphs Abs: 0.8 10*3/uL (ref 0.7–4.0)
MCH: 34 pg (ref 26.0–34.0)
MCHC: 35.7 g/dL (ref 30.0–36.0)
MCV: 95 fL (ref 78.0–100.0)
Monocytes Absolute: 0.3 10*3/uL (ref 0.1–1.0)
Monocytes Relative: 8 % (ref 3–12)
Neutro Abs: 2.8 10*3/uL (ref 1.7–7.7)
Neutrophils Relative %: 67 % (ref 43–77)
Platelets: 215 10*3/uL (ref 150–400)
RBC: 3.21 MIL/uL — ABNORMAL LOW (ref 4.22–5.81)
RDW: 12.2 % (ref 11.5–15.5)
WBC: 4.2 10*3/uL (ref 4.0–10.5)

## 2014-12-19 LAB — BASIC METABOLIC PANEL
Anion gap: 11 (ref 5–15)
BUN: 6 mg/dL (ref 6–20)
CO2: 21 mmol/L — ABNORMAL LOW (ref 22–32)
Calcium: 8.7 mg/dL — ABNORMAL LOW (ref 8.9–10.3)
Chloride: 100 mmol/L — ABNORMAL LOW (ref 101–111)
Creatinine, Ser: 0.77 mg/dL (ref 0.61–1.24)
GFR calc Af Amer: >60 mL/min (ref >60)
GFR calc non Af Amer: >60 mL/min (ref >60)
Glucose, Bld: 138 mg/dL — ABNORMAL HIGH (ref 65–99)
Potassium: 3.2 mmol/L — ABNORMAL LOW (ref 3.5–5.1)
Sodium: 132 mmol/L — ABNORMAL LOW (ref 135–145)

## 2014-12-19 MED ORDER — MELOXICAM 7.5 MG PO TABS: 7.5000 mg | ORAL_TABLET | Freq: Once | ORAL | Status: DC | PRN

## 2014-12-19 NOTE — Progress Notes (Signed)
Family Medicine Teaching Service Daily Progress Note Intern Pager: [phone]  Patient name: Jimmy [name] Medical record number: [id number] Date of birth: 01/18/1966 Age: 49 y.o. Gender: male  Primary Care Provider: Lucilla Edin, MD Consultants: none Code Status: full  Pt Overview and Major Events to Date:  8/18: Admit for orthostatic hypotension and syncopal event; CT head negative   Assessment and Plan: Jimmy [name] is a 49 y.o. male presenting from clinic for orthostatic hypotension and syncopal event . PMH is significant for recent admission for prostatitis, mild adrenal insufficiency, POTS, Herpes zoster ophthalmicus in June, history of spinal stenosis and low back pain s/p surgery  Orthostatic Hypotension due to POTS-  Mildly adrenal insufficient with random cortisol of 4.5 orthostatics with symptoms of dizziness. CT head performed to r/o intracranial process (8/18) ACTH stim test performed and patient had an appropriate rise in Cortisol within 30 minutes  - NS IVF 125cc/hr  - Orthostatics q6hrs  - Hydrocortisone 10 mg once in the AM, 5 mg BID, and  Florinef 0.2 mg  - [facility], patient on wait-list for bed; [facility] unable to accept   Chronic low back pain and abdominal pain- Stable and well controlled on home regimen of Morphine PO 15 mg q6hr - continue home regimen  Prostatitis- He was still on bactrim as prescribed to continue a 6 week course. He was afebrile on presentation and did not report fevers at home. His low back pain and lower abdominal pain were stable from prior admission - continue bactrim 800-160 BID to complete 6 week course - WBC 2.8 > 4.2, CBC with diff this AM   Urinary retention with indwelling foley catheter- - Exam not concerning for infection - Attempt voiding trial, possibly tomorrow as patient is uncomfortable with trying today,  - Would like a urology referral upon discharge, as has missed other outpatient appointments due readmission  -  UA negative   Concern for weight loss- Stable weight since 8/5 was 195, 193 - Weight this AM 198 lbs  - Cotinue to monitor weights  Constipation- Required an enema during last inpatient admission and has since been taking Miiralax and Colace with stable constipation symptoms.  - continue Miralax/Colace  T2DM- New diagnosis with A1c 6.5 on 12/04/2014.  - SSI - CBG ACHS  FEN/GI: Carb modified diet, NS 125cc/hr Prophylaxis: heparin subq  Disposition: Floor, patient would like referrals to urology and endocrinology before dispo   Subjective: Patient states that he is having muscle spasms of the back. He would like something to help with this. Patient feels that the pain in his abdomen is about the same. He denies any other complaints.   Objective: Temp:  [97.7 F (36.5 C)-98.1 F (36.7 C)] 98 F (36.7 C) (08/20 1000) Pulse Rate:  [87-93] 91 (08/20 0436) Resp:  [17-18] 18 (08/20 0436) BP: (118-160)/(70-90) 118/70 mmHg (08/20 0436) SpO2:  [99 %-100 %] 100 % (08/20 1000) Weight:  [198 lb 10.2 oz (90.1 kg)] 198 lb 10.2 oz (90.1 kg) (08/19 2020) Physical Exam: General: NAD, lying in bed, wife at bedside Cardiovascular: RRR, no murmurs appreciated Respiratory: CTAB, no wheezes, rales or crackles Abdomen: soft, ND, right low quadrant and suprapubic pain with palpation.  Extremities: no edema in LE   Laboratory:  Recent Labs Lab 12/17/14 1515 12/18/14 0730 12/19/14 1018  WBC 3.1* 2.8* 4.2  HGB 11.9* 10.3* 10.9*  HCT 34.1* 29.6* 30.5*  PLT 171 193 215    Recent Labs Lab 12/17/14 1134 12/17/14 1515 12/18/14  0730 12/19/14 1018  NA 131*  --  132* 132*  K 3.9  --  3.7 3.2*  CL 97*  --  101 100*  CO2 25  --  22 21*  BUN 8  --  9 6  CREATININE 0.85 0.81 0.89 0.77  CALCIUM 9.6  --  8.7* 8.7*  PROT  --   --  5.8*  --   BILITOT  --   --  0.4  --   ALKPHOS  --   --  50  --   ALT  --   --  23  --   AST  --   --  20  --   GLUCOSE 107*  --  106* 138*   ACTH Stimulation  Test  Cortisol, Base ug/dL 4.4   Comments: NO NORMAL RANGE ESTABLISHED FOR THIS TEST   Cortisol, 30 Min ug/dL 16.1   Cortisol, 60 Min ug/dL 09.6         Imaging/Diagnostic Tests: No results found.  Latravion Graves Mayra Reel, MD 12/19/2014, 1:47 PM PGY-1, [facility] FPTS Intern pager: [phone], text pages welcome

## 2014-12-20 LAB — CBC
HCT: 29.9 % — ABNORMAL LOW (ref 39.0–52.0)
Hemoglobin: 10.5 g/dL — ABNORMAL LOW (ref 13.0–17.0)
MCH: 33.3 pg (ref 26.0–34.0)
MCHC: 35.1 g/dL (ref 30.0–36.0)
MCV: 94.9 fL (ref 78.0–100.0)
Platelets: 225 10*3/uL (ref 150–400)
RBC: 3.15 MIL/uL — ABNORMAL LOW (ref 4.22–5.81)
RDW: 12.3 % (ref 11.5–15.5)
WBC: 3.2 10*3/uL — ABNORMAL LOW (ref 4.0–10.5)

## 2014-12-20 NOTE — Progress Notes (Signed)
Family Medicine Teaching Service Daily Progress Note Intern Pager: [phone]  Patient name: Jimmy [name] Medical record number: [id number] Date of birth: [birth year] Age: 49 y.o. Gender: male  Primary Care Provider: Lucilla Edin, MD Consultants: none Code Status: full  Pt Overview and Major Events to Date:  8/18: Admit for orthostatic hypotension and syncopal event; CT head negative   Assessment and Plan: Jimmy [name] is a 49 y.o. male presenting from clinic for orthostatic hypotension and syncopal event . PMH is significant for recent admission for prostatitis, mild adrenal insufficiency, POTS, Herpes zoster ophthalmicus in June, history of spinal stenosis and low back pain s/p surgery  Orthostatic Hypotension due to POTS-  Mildly adrenal insufficient with random cortisol of 4.5 orthostatics with symptoms of dizziness. CT head performed to r/o intracranial process (8/18) ACTH stim test performed and patient had an appropriate rise in Cortisol within 30 minutes  - SLIV to see if tolerates prior to d/c  - Orthostatics q6hrs - Continues to drop to 80s/50s when standing - Hydrocortisone 10 mg once in the AM, 5 mg BID, and  Florinef 0.2 mg daily - [facility], patient on wait-list for bed; [facility] unable to accept   Chronic low back pain and abdominal pain- Stable and well controlled on home regimen of Morphine PO 15 mg q6hr - continue home regimen  Prostatitis- He was still on bactrim as prescribed to continue a 6 week course. He was afebrile on presentation and did not report fevers at home. His low back pain and lower abdominal pain were stable from prior admission - continue bactrim 800-160 BID to complete 6 week course  Urinary retention with indwelling foley catheter. UA negative - Attempt voiding trial - Would like a urology referral upon discharge, as has missed other outpatient appointments due readmission   Concern for weight loss- Stable weight since 8/5 was 195,  193 - Cotinue to monitor weights  Constipation- Required an enema during last inpatient admission and has since been taking Miralax and Colace with stable constipation symptoms.  - continue Miralax/Colace  T2DM- New diagnosis with A1c 6.5 on 12/04/2014.  - Continue SSI and CBG ACHS  FEN/GI: Reg diet, SLIV Prophylaxis: heparin subq  Disposition: Floor, patient would like referrals to urology and endocrinology before dispo. Awaiting bed placement at [facility], but consider d/c if BP stable    Subjective: Patient states that dizziness improving.  Denies other complaints.  Would like trial void today.  Would like to go home tomorrow after referrals can be placed to urology and endocrinology.  Objective: Temp:  [98 F (36.7 C)-98.1 F (36.7 C)] 98.1 F (36.7 C) (08/21 0851) Pulse Rate:  [86-94] 94 (08/21 0851) Resp:  [17-20] 20 (08/21 0851) BP: (131-156)/(74-86) 148/86 mmHg (08/21 0851) SpO2:  [98 %-100 %] 100 % (08/21 0851) Physical Exam: General: NAD, lying in bed, wife at bedside Cardiovascular: RRR, no murmurs appreciated Respiratory: CTAB, no wheezes, rales or crackles Abdomen: soft, ND, RUQ mildly TTP.  Extremities: no edema in LE   Laboratory:  Recent Labs Lab 12/18/14 0730 12/19/14 1018 12/20/14 0543  WBC 2.8* 4.2 3.2*  HGB 10.3* 10.9* 10.5*  HCT 29.6* 30.5* 29.9*  PLT 193 215 225    Recent Labs Lab 12/17/14 1134 12/17/14 1515 12/18/14 0730 12/19/14 1018  NA 131*  --  132* 132*  K 3.9  --  3.7 3.2*  CL 97*  --  101 100*  CO2 25  --  22 21*  BUN 8  --  9 6  CREATININE 0.85 0.81 0.89 0.77  CALCIUM 9.6  --  8.7* 8.7*  PROT  --   --  5.8*  --   BILITOT  --   --  0.4  --   ALKPHOS  --   --  50  --   ALT  --   --  23  --   AST  --   --  20  --   GLUCOSE 107*  --  106* 138*    ACTH Stimulation Test  Cortisol, Base ug/dL 4.4   Comments: NO NORMAL RANGE ESTABLISHED FOR THIS TEST   Cortisol, 30 Min ug/dL 16.1   Cortisol, 60 Min ug/dL 09.6          Imaging/Diagnostic Tests: No results found.  Erasmo Downer, MD 12/20/2014, 9:13 AM PGY-2, [place] [facility] FPTS Intern pager: [phone], text pages welcome

## 2014-12-20 NOTE — Progress Notes (Signed)
Patient refused to have bed alarm be turned on. Explained to patient that because his blood pressure drops when he stands that he might feel dizzy and fall. Pt. states "I've been walking in the room all day by myself. Advised patient to call for assistance before getting up. Daimian Sudberry, Drinda Butts, Charity fundraiser

## 2014-12-20 NOTE — Discharge Summary (Signed)
Family Medicine Teaching [facility] Discharge Summary  Patient name: Jimmy [name] Medical record number: [id number] Date of birth: 1965/07/06 Age: 49 y.o. Gender: male Date of Admission: 12/17/2014  Date of Discharge: 12/23/2014 Admitting Physician: Raeford Razor, MD  Primary Care Provider: Lucilla Edin, MD Consultants:   Indication for Hospitalization: Orthostatic Hypotension and Syncopal event   Discharge Diagnoses/Problem List:  Patient Active Problem List   Diagnosis Date Noted  . Chronic pain syndrome   . Hyponatremia   . Hypotension, postural   . Syncope 12/17/2014  . Faintness   . Right hip pain   . Abnormal EKG 12/12/2014  . POTS (postural orthostatic tachycardia syndrome)   . Adrenal insufficiency   . Groin pain   . Orthostatic hypotension   . Lower abdominal pain 12/04/2014  . Weight loss, unintentional   . Absolute anemia   . Prostatitis 12/03/2014  . Abdominal pain 12/02/2014  . Hypokalemia 12/02/2014  . Urinary retention 12/02/2014  . Prostatitis, acute   . Abdominal pain, lower   . Chronic lower back pain     Disposition: Home   Discharge Condition: Stable   Discharge Exam:  General: NAD, wife at bedside Cardiovascular: RRR, no murmurs appreciated Respiratory: CTAB, no wheezes, rales or crackles Abdomen: soft, ND, RUQ mildly TTP.  Extremities: no edema in LE; wearing TED hose   Brief Hospital Course:  Patient presented from clinic for continued orthostatic hypotension and syncopal event. PMH is significant for recent admission for prostatitis, mild adrenal insufficiency, POTS, Herpes zoster ophthalmicus in June, history of spinal stenosis and low back pain s/p surgery.  Discontinued patient's midodrine 2/2 to patient's difficulties with urinary rentention. Indwelling catheter was removed and patient passed voiding trial without complication. Increased patient's Florinef to 0.3mg  daily and added hydrocortisone 20 mg. Patient's standing blood  pressures remained in 80s-60s/50s-20s throughout hospital course. ACTH stim test performed and patient had appropriate rise in cortisol within 30 minutes.   Nephrology consulted during hospital stay. Ordered B12, SPEP, ACE, SSA, SSB, HIV. B12 within normal limits and HIV negative. Recommended that EMG/NCS may be useful, but unable to be performed here.   Patient's PCP aware of case. Working to facilitate outpatient endocrinology and urology follow ups. Patient and wife agreed that the best plan was for him to return home with outpatient monitoring. Wife able to provide assistance around house. Will order walker for safety with ambulation.    Issues for Follow Up:  1. For orthostatic hypotension, patient discharged with Hydrocortisone 20 mg daily and Florinef 0.3 mg daily.  Needs outpatient endocrinology follow up.  2. Prostatitis: Patient still on Bactrim 800-160 mg BID. Should continue for 4 weeks to finish 6 week course.  3. Urinary Retention: D/C Midodrine and indwelling urinary catheter during hospiral stay. Will need urology f/u.  4. Neurology outpatient follow up     Significant Procedures: .  Significant Labs and Imaging:   Recent Labs Lab 12/18/14 0730 12/19/14 1018 12/20/14 0543  WBC 2.8* 4.2 3.2*  HGB 10.3* 10.9* 10.5*  HCT 29.6* 30.5* 29.9*  PLT 193 215 225    Recent Labs Lab 12/17/14 1134 12/17/14 1515 12/18/14 0730 12/19/14 1018  NA 131*  --  132* 132*  K 3.9  --  3.7 3.2*  CL 97*  --  101 100*  CO2 25  --  22 21*  GLUCOSE 107*  --  106* 138*  BUN 8  --  9 6  CREATININE 0.85 0.81 0.89 0.77  CALCIUM 9.6  --  8.7* 8.7*  ALKPHOS  --   --  50  --   AST  --   --  20  --   ALT  --   --  23  --   ALBUMIN  --   --  2.8*  --     ACTH Stimulation Test  Cortisol, Base ug/dL 4.4   Comments: NO NORMAL RANGE ESTABLISHED FOR THIS TEST   Cortisol, 30 Min ug/dL 47.8   Cortisol, 60 Min ug/dL 29.5            Ct Head Wo Contrast  12/17/2014  CLINICAL DATA: Syncope. EXAM: CT HEAD WITHOUT CONTRAST TECHNIQUE: Contiguous axial images were obtained from the base of the skull through the vertex without intravenous contrast. COMPARISON: 11/27/2005 FINDINGS: Skull and Sinuses:Chronic skin based thickening at the central forehead. No acute fracture. Orbits: No acute abnormality. Brain: Negative. No evidence of acute infarction, hemorrhage, hydrocephalus, or mass lesion/mass effect. IMPRESSION: Negative head CT. Electronically Signed By: Marnee Spring M.D. On: 12/17/2014 22:26    Results/Tests Pending at Time of Discharge: SPEP, ACE, SSA, SSB  Discharge Medications:    Medication List    ASK your doctor about these medications        baclofen 10 MG tablet  Commonly known as:  LIORESAL  Take 0.5 tablets (5 mg total) by mouth 3 (three) times daily as needed for muscle spasms.     docusate sodium 250 MG capsule  Commonly known as:  COLACE  Take 1 capsule (250 mg total) by mouth daily.     fludrocortisone 0.1 MG tablet  Commonly known as:  FLORINEF  Take 1 tablet (0.1 mg total) by mouth daily.     gabapentin 300 MG capsule  Commonly known as:  NEURONTIN  Take 1 capsule (300 mg total) by mouth 3 (three) times daily.     HYDROcodone-acetaminophen 5-325 MG per tablet  Commonly known as:  NORCO/VICODIN  Take 1-2 tablets by mouth every 4 (four) hours as needed.     meloxicam 7.5 MG tablet  Commonly known as:  MOBIC  Take 1 tablet (7.5 mg total) by mouth daily.     midodrine 5 MG tablet  Commonly known as:  PROAMATINE  Take 1 tablet (5 mg total) by mouth 3 (three) times daily with meals.     morphine 15 MG tablet  Commonly known as:  MSIR  Take 1 tablet (15 mg total) by mouth every 6 (six) hours as needed for severe pain.     ondansetron 4 MG disintegrating tablet  Commonly known as:  ZOFRAN ODT  Take 1 tablet (4 mg total) by mouth every 8 (eight) hours as needed for nausea or vomiting.     phenazopyridine  100 MG tablet  Commonly known as:  PYRIDIUM  Take 1 tablet (100 mg total) by mouth 3 (three) times daily with meals.     polyethylene glycol powder powder  Commonly known as:  GLYCOLAX/MIRALAX  Take 17 g by mouth 2 (two) times daily as needed.     sulfamethoxazole-trimethoprim 800-160 MG per tablet  Commonly known as:  BACTRIM DS,SEPTRA DS  Take 1 tablet by mouth 2 (two) times daily.     tamsulosin 0.4 MG Caps capsule  Commonly known as:  FLOMAX  Take 1 capsule (0.4 mg total) by mouth daily.     traMADol 50 MG tablet  Commonly known as:  ULTRAM  Take 1 tablet (50 mg total) by mouth every 8 (eight) hours as needed.  venlafaxine 25 MG tablet  Commonly known as:  EFFEXOR  Take 1 tablet (25 mg total) by mouth 2 (two) times daily with a meal.        Discharge Instructions: Please refer to Patient Instructions section of EMR for full details.  Patient was counseled important signs and symptoms that should prompt return to medical care, changes in medications, dietary instructions, activity restrictions, and follow up appointments.   Follow-Up Appointments: Contact Dr. Cleta Alberts (PCP) regarding outpatient follow up and referrals.   Marcy Siren, D.O. 12/21/2014, 9:49 AM PGY-1, [facility]

## 2014-12-21 ENCOUNTER — Telehealth: Payer: Self-pay

## 2014-12-21 LAB — CBC
HCT: 31.8 % — ABNORMAL LOW (ref 39.0–52.0)
Hemoglobin: 11.3 g/dL — ABNORMAL LOW (ref 13.0–17.0)
MCH: 33.6 pg (ref 26.0–34.0)
MCHC: 35.5 g/dL (ref 30.0–36.0)
MCV: 94.6 fL (ref 78.0–100.0)
Platelets: 245 10*3/uL (ref 150–400)
RBC: 3.36 MIL/uL — ABNORMAL LOW (ref 4.22–5.81)
RDW: 12.3 % (ref 11.5–15.5)
WBC: 4.1 10*3/uL (ref 4.0–10.5)

## 2014-12-21 LAB — BASIC METABOLIC PANEL
Anion gap: 9 (ref 5–15)
BUN: 6 mg/dL (ref 6–20)
CO2: 25 mmol/L (ref 22–32)
Calcium: 9.3 mg/dL (ref 8.9–10.3)
Chloride: 102 mmol/L (ref 101–111)
Creatinine, Ser: 0.84 mg/dL (ref 0.61–1.24)
GFR calc Af Amer: >60 mL/min (ref >60)
GFR calc non Af Amer: >60 mL/min (ref >60)
Glucose, Bld: 101 mg/dL — ABNORMAL HIGH (ref 65–99)
Potassium: 3 mmol/L — ABNORMAL LOW (ref 3.5–5.1)
Sodium: 136 mmol/L (ref 135–145)

## 2014-12-21 MED ORDER — POTASSIUM CHLORIDE CRYS ER 20 MEQ PO TBCR
60.0000 meq | EXTENDED_RELEASE_TABLET | Freq: Once | ORAL | Status: AC
  Administered 2014-12-21: 60 meq via ORAL
  Filled 2014-12-21: qty 3

## 2014-12-21 MED ORDER — SODIUM CHLORIDE 0.9 % IV SOLN
INTRAVENOUS | Status: DC
  Administered 2014-12-21 – 2014-12-22 (×2): via INTRAVENOUS

## 2014-12-21 NOTE — Progress Notes (Signed)
12/20/14  0120 Received call from [facility] inquiring if patient is still awaiting bed from [facility]. No bed available yet ,they will give a call once bed is available,they just want to know if he is medically cleared to go to [facility] once bed is available. Sherhonda Gaspar, Drinda Butts, Charity fundraiser

## 2014-12-21 NOTE — Progress Notes (Signed)
Family Medicine Teaching Service Daily Progress Note Intern Pager: [phone]  Patient name: Jimmy [name] Medical record number: [id number] Date of birth: 12-Mar-1966 Age: 49 y.o. Gender: male  Primary Care Provider: Lucilla Edin, MD Consultants: none Code Status: full  Pt Overview and Major Events to Date:  8/18: Admit for orthostatic hypotension and syncopal event; CT head negative   Assessment and Plan: Jimmy [name] is a 49 y.o. male presenting from clinic for orthostatic hypotension and syncopal event . PMH is significant for recent admission for prostatitis, mild adrenal insufficiency, POTS, Herpes zoster ophthalmicus in June, history of spinal stenosis and low back pain s/p surgery  Orthostatic Hypotension due to POTS-  Mildly adrenal insufficient with random cortisol of 4.5 orthostatics with symptoms of dizziness. CT head performed to r/o intracranial process (8/18) ACTH stim test performed and patient had an appropriate rise in Cortisol within 30 minutes  - SLIV to see if tolerates prior to d/c  - Orthostatics q6hrs - Continues to drop to 80s/50s when standing - Hydrocortisone 10 mg once in the AM, 5 mg BID, and  Florinef 0.2 mg daily - [facility], patient on wait-list for bed; [facility] unable to accept  -would like endocrinology outpatient referral   Chronic low back pain and abdominal pain- Stable and well controlled on home regimen of Morphine PO 15 mg q6hr - continue home regimen  Prostatitis- He was still on bactrim as prescribed to continue a 6 week course. He was afebrile on presentation and did not report fevers at home. His low back pain and lower abdominal pain were stable from prior admission - continue bactrim 800-160 BID to complete 6 week course  Urinary retention s/p indwelling foley catheter. UA negative - voiding trial performed without issue  - Would like a urology referral upon discharge, as has missed other outpatient appointments due readmission    Concern for weight loss- Stable weight since 8/5 was 195, 193 - Cotinue to monitor weights  Constipation- Required an enema during last inpatient admission and has since been taking Miralax and Colace with stable constipation symptoms.  - continue Miralax/Colace  T2DM- New diagnosis with A1c 6.5 on 12/04/2014.  - Continue SSI and CBG ACHS  FEN/GI: Reg diet, SLIV Prophylaxis: heparin subq  Disposition: Floor, patient would like referrals to urology and endocrinology before dispo. Awaiting bed placement at [facility], but consider d/c if BP stable    Subjective: Patient unable to perform the 3 minute standing VS this morning 2/2 dizziness. States that he would like endocrinology and urology consults outpatient, but that if he is unable to get follow up soon would like to await bed at [facility]. Dizziness was worse this morning with standing. Denies dizziness while lying down or sitting on side of bed.   Objective: Temp:  [97.7 F (36.5 C)-98.1 F (36.7 C)] 97.7 F (36.5 C) (08/22 0650) Pulse Rate:  [85-94] 85 (08/22 0650) Resp:  [18-20] 18 (08/22 0650) BP: (135-148)/(77-86) 135/83 mmHg (08/21 2359) SpO2:  [99 %-100 %] 100 % (08/22 0650) Weight:  [199 lb 1.2 oz (90.3 kg)] 199 lb 1.2 oz (90.3 kg) (08/21 2359) Physical Exam: General: NAD, sitting on side of bed, wife at bedside Cardiovascular: RRR, no murmurs appreciated Respiratory: CTAB, no wheezes, rales or crackles Abdomen: soft, ND, RUQ mildly TTP.  Extremities: no edema in LE; wearing support stockings   Laboratory:  Recent Labs Lab 12/19/14 1018 12/20/14 0543 12/21/14 0500  WBC 4.2 3.2* 4.1  HGB 10.9* 10.5* 11.3*  HCT  30.5* 29.9* 31.8*  PLT 215 225 245    Recent Labs Lab 12/18/14 0730 12/19/14 1018 12/21/14 0500  NA 132* 132* 136  K 3.7 3.2* 3.0*  CL 101 100* 102  CO2 22 21* 25  BUN 9 6 6   CREATININE 0.89 0.77 0.84  CALCIUM 8.7* 8.7* 9.3  PROT 5.8*  --   --   BILITOT 0.4  --   --   ALKPHOS 50  --   --   ALT 23   --   --   AST 20  --   --   GLUCOSE 106* 138* 101*    ACTH Stimulation Test  Cortisol, Base ug/dL 4.4   Comments: NO NORMAL RANGE ESTABLISHED FOR THIS TEST   Cortisol, 30 Min ug/dL 16.1   Cortisol, 60 Min ug/dL 09.6         Imaging/Diagnostic Tests: Ct Head Wo Contrast  12/17/2014   CLINICAL DATA:  Syncope.  EXAM: CT HEAD WITHOUT CONTRAST  TECHNIQUE: Contiguous axial images were obtained from the base of the skull through the vertex without intravenous contrast.  COMPARISON:  11/27/2005  FINDINGS: Skull and Sinuses:Chronic skin based thickening at the central forehead.  No acute fracture.  Orbits: No acute abnormality.  Brain: Negative. No evidence of acute infarction, hemorrhage, hydrocephalus, or mass lesion/mass effect.  IMPRESSION: Negative head CT.   Electronically Signed   By: Marnee Spring M.D.   On: 12/17/2014 22:26   Arvilla Market, DO 12/21/2014, 8:50 AM PGY-2, [place] [facility] FPTS Intern pager: [phone], text pages welcome

## 2014-12-21 NOTE — Progress Notes (Signed)
Orthostatic VS performed by NT,standing BP dropped to 67/36,patient got real dizzy,unable to perform the 3 minute standing VS. Assisted pt back to bed. Explained to patient the need to place bed alarm because he might get dizzy again and fall. Advised patient also to call before getting up. Call bell within reach. Dory Verdun, Drinda Butts, Charity fundraiser

## 2014-12-21 NOTE — Telephone Encounter (Signed)
Dr. Valaria Good states they need to schedule out patient referrals for pt. Please call [phone]

## 2014-12-21 NOTE — Care Management Important Message (Signed)
Important Message  Patient Details  Name: Jimmy [name] MRN: [id number] Date of Birth: 02/07/1966   Medicare Important Message Given:  Yes-fourth notification given    Elliot Cousin, RN 12/21/2014, 3:16 PM

## 2014-12-22 ENCOUNTER — Other Ambulatory Visit: Payer: Self-pay | Admitting: Emergency Medicine

## 2014-12-22 DIAGNOSIS — R339 Retention of urine, unspecified: Secondary | ICD-10-CM

## 2014-12-22 DIAGNOSIS — E2740 Unspecified adrenocortical insufficiency: Secondary | ICD-10-CM

## 2014-12-22 LAB — BASIC METABOLIC PANEL
Anion gap: 10 (ref 5–15)
BUN: <5 mg/dL — ABNORMAL LOW (ref 6–20)
CO2: 22 mmol/L (ref 22–32)
Calcium: 9.1 mg/dL (ref 8.9–10.3)
Chloride: 102 mmol/L (ref 101–111)
Creatinine, Ser: 0.82 mg/dL (ref 0.61–1.24)
GFR calc Af Amer: >60 mL/min (ref >60)
GFR calc non Af Amer: >60 mL/min (ref >60)
Glucose, Bld: 124 mg/dL — ABNORMAL HIGH (ref 65–99)
Potassium: 3.7 mmol/L (ref 3.5–5.1)
Sodium: 134 mmol/L — ABNORMAL LOW (ref 135–145)

## 2014-12-22 LAB — VITAMIN B12: Vitamin B-12: 378 pg/mL (ref 180–914)

## 2014-12-22 MED ORDER — FLUDROCORTISONE ACETATE 0.1 MG PO TABS
0.3000 mg | ORAL_TABLET | Freq: Every day | ORAL | Status: DC
  Administered 2014-12-23: 0.3 mg via ORAL
  Filled 2014-12-22: qty 3

## 2014-12-22 NOTE — Telephone Encounter (Signed)
Dr Cathlean Cower called from [facility] hosp where pt is an inpatient currently. She would REALLY appreciate it if we could get referral orders in and appts set up ASAP for pt to see Urologist and endocrinologist. She stated there are plenty of notes in EPIC and Dr Cleta Alberts has seen pt for associated Dxs recently. Pt is refusing to leave the hospital until the appts are set up and Dr Cleta Alberts has to be the one to set up these outpt specialty appts.

## 2014-12-22 NOTE — Telephone Encounter (Signed)
I spoke with the family medicine resident. The patient wants appointments made before they leave the hospital. Request sent to referrals for evaluation by urology and endocrinology.

## 2014-12-22 NOTE — Progress Notes (Signed)
Contacted [facility] to discuss further recommendations. They indicated to continue the patient on Florinef 0.3 mg,  Hydrocortisone, and TED hoses. States that patient may benefit from an neurology consult as well. Fellow indicated that will discuss patient with attending for any further recs that they may have.

## 2014-12-22 NOTE — Progress Notes (Signed)
Family Medicine Teaching Service Daily Progress Note Intern Pager: [phone]  Patient name: Jimmy [name] Medical record number: [id number] Date of birth: [birth year] Age: 49 y.o. Gender: male  Primary Care Provider: Lucilla Edin, MD Consultants: none Code Status: full  Pt Overview and Major Events to Date:  8/18: Admit for orthostatic hypotension and syncopal event; CT head negative   Assessment and Plan: Jimmy [name] is a 49 y.o. male presenting from clinic for orthostatic hypotension and syncopal event . PMH is significant for recent admission for prostatitis, mild adrenal insufficiency, POTS, Herpes zoster ophthalmicus in June, history of spinal stenosis and low back pain s/p surgery  Orthostatic Hypotension due to POTS-  Mildly adrenal insufficient with random cortisol of 4.5 orthostatics with symptoms of dizziness. CT head performed to r/o intracranial process (8/18) ACTH stim test performed and patient had an appropriate rise in Cortisol within 30 minutes  - Orthostatics daily - Continues to drop to 80s/50s when standing - Hydrocortisone 10 mg once in the AM, 5 mg BID, - Increased  Florinef 0.3 mg daily  - [facility], patient on wait-list for bed; [facility] unable to accept  - Will try to get in touch with [facility] endocrinology again for further recs, then consider touching base with neurology  - Will stop fluids, patient has noticed any improvement with fluids  Chronic low back pain and abdominal pain- Stable and well controlled on home regimen of Morphine PO 15 mg q6hr - continue home regimen  Prostatitis- He was still on bactrim as prescribed to continue a 6 week course. He was afebrile on presentation and did not report fevers at home. His low back pain and lower abdominal pain were stable from prior admission - continue bactrim 800-160 BID to complete 6 week course  Urinary retention s/p indwelling foley catheter. UA negative - voiding trial performed without issue,  patient without voiding issues  - Would like a urology referral upon discharge  Concern for weight loss- Stable weight since 8/5 was 195, 193 - Cotinue to monitor weights  Constipation- Required an enema during last inpatient admission and has since been taking Miralax and Colace with stable constipation symptoms.  - continue Miralax/Colace  T2DM- New diagnosis with A1c 6.5 on 12/04/2014.  - Continue SSI and CBG ACHS  FEN/GI: Reg diet, SLIV Prophylaxis: heparin subq  Disposition: Floor, patient would like referrals to urology and endocrinology before dispo. Awaiting bed placement at [facility], but consider d/c if BP stable    Subjective: Patient continues to remain dizzy when sitting or standing. Has a decreased appetite from having been eating hospital food for so long. Endorse fatigue. Denies n/v, dyspnea, chest pain, fever or chills. Able to ambulate with help.   Objective: Temp:  [98 F (36.7 C)-98.5 F (36.9 C)] 98.4 F (36.9 C) (08/23 0631) Pulse Rate:  [92-102] 102 (08/22 2002) Resp:  [17-18] 18 (08/23 0631) BP: (131-138)/(75-81) 138/75 mmHg (08/22 2002) SpO2:  [98 %-100 %] 100 % (08/23 0631) Weight:  [201 lb 1 oz (91.2 kg)] 201 lb 1 oz (91.2 kg) (08/22 2002)  Physical Exam: General: NAD, sitting on side of bed, wife at bedside Cardiovascular: RRR, no murmurs appreciated Respiratory: CTAB, no wheezes, rales or crackles Abdomen: soft, ND, RUQ mildly TTP.  Extremities: no edema in LE; wearing support stockings   Laboratory:  Recent Labs Lab 12/19/14 1018 12/20/14 0543 12/21/14 0500  WBC 4.2 3.2* 4.1  HGB 10.9* 10.5* 11.3*  HCT 30.5* 29.9* 31.8*  PLT 215 225 245  Recent Labs Lab 12/18/14 0730 12/19/14 1018 12/21/14 0500  NA 132* 132* 136  K 3.7 3.2* 3.0*  CL 101 100* 102  CO2 22 21* 25  BUN 9 6 6   CREATININE 0.89 0.77 0.84  CALCIUM 8.7* 8.7* 9.3  PROT 5.8*  --   --   BILITOT 0.4  --   --   ALKPHOS 50  --   --   ALT 23  --   --   AST 20  --   --    GLUCOSE 106* 138* 101*    ACTH Stimulation Test  Cortisol, Base ug/dL 4.4   Comments: NO NORMAL RANGE ESTABLISHED FOR THIS TEST   Cortisol, 30 Min ug/dL 16.1   Cortisol, 60 Min ug/dL 09.6         Imaging/Diagnostic Tests: No results found. Stamatia Masri Mayra Reel, MD 12/22/2014, 8:00 AM PGY-2, [facility] Family Medicine FPTS Intern pager: [phone], text pages welcome

## 2014-12-22 NOTE — Consult Note (Signed)
NEURO HOSPITALIST CONSULT NOTE   Referring physician:Neal   Reason for Consult: Orthostasis  HPI:                                                                                                                                          Jimmy [name] is an 49 y.o. male who was recently discharged from [facility] few days ago for dizziness and postural hypotension for which he was diagnosed with POTS, he was subsequently discharged home on florinef and midodrine. Today while following up with his PCP after his recent hospitalization, he was found to have low BP with standing as well as increased HR to the 120s. He was subsequently sent to the ED for evaluation. The day prior to ED arrival he had lost consciousness while sitting on the side of his bed. At home he was on Midodrine 5 mg TID and Florinef 0.1 mg daily. Patient continues to have decrease in BP when standing.  Endocrinology has been contacted and recommended to increase florinef to 0.3 mg daily, continue current hydrocortisone and add TED hose. Patietn states he is ok if he continues to move but if he just stand up he will often become dizzy or have a syncopal spell with in 3 minutes.  Neurology was asked to evaluate patient for further recommendations.   He has not had an erection for some time, but has not tried either so is not sure if this is truly ED.  Past Medical History  Diagnosis Date  . Back pain     Past Surgical History  Procedure Laterality Date  . Back surgery      Family History  Problem Relation Age of Onset  . Heart disease Maternal Grandmother   . Cancer Maternal Grandfather     Social History:  reports that he quit smoking about 7 years ago. He has never used smokeless tobacco. He reports that he does not drink alcohol or use illicit drugs.  Allergies  Allergen Reactions  . Antihistamines, Chlorpheniramine-Type Other (See Comments)    Hallucinations  . Other     pyridium  .  Phenazopyridine Hives and Other (See Comments)    Pyridium. Dizziness and indigestion    MEDICATIONS:  Prior to Admission:  Prescriptions prior to admission  Medication Sig Dispense Refill Last Dose  . baclofen (LIORESAL) 10 MG tablet Take 0.5 tablets (5 mg total) by mouth 3 (three) times daily as needed for muscle spasms. 30 each 0 12/16/2014 at Unknown time  . docusate sodium (COLACE) 250 MG capsule Take 1 capsule (250 mg total) by mouth daily. 10 capsule 0 12/16/2014 at Unknown time  . fludrocortisone (FLORINEF) 0.1 MG tablet Take 1 tablet (0.1 mg total) by mouth daily. 30 tablet 3 12/16/2014 at Unknown time  . gabapentin (NEURONTIN) 300 MG capsule Take 1 capsule (300 mg total) by mouth 3 (three) times daily. 30 capsule 3 12/17/2014 at Unknown time  . meloxicam (MOBIC) 7.5 MG tablet Take 1 tablet (7.5 mg total) by mouth daily. 30 tablet 3 12/16/2014 at Unknown time  . midodrine (PROAMATINE) 5 MG tablet Take 1 tablet (5 mg total) by mouth 3 (three) times daily with meals. 30 tablet 3 12/17/2014 at Unknown time  . morphine (MSIR) 15 MG tablet Take 1 tablet (15 mg total) by mouth every 6 (six) hours as needed for severe pain. 30 tablet 0 12/17/2014 at Unknown time  . polyethylene glycol powder (GLYCOLAX/MIRALAX) powder Take 17 g by mouth 2 (two) times daily as needed. (Patient taking differently: Take 17 g by mouth 2 (two) times daily as needed (constipation). ) 3350 g 1 12/16/2014 at Unknown time  . sulfamethoxazole-trimethoprim (BACTRIM DS,SEPTRA DS) 800-160 MG per tablet Take 1 tablet by mouth 2 (two) times daily. (Patient taking differently: Take 1 tablet by mouth 2 (two) times daily. 42 day course started 12/03/14 pm) 84 tablet 0 12/16/2014 at Unknown time  . tamsulosin (FLOMAX) 0.4 MG CAPS capsule Take 1 capsule (0.4 mg total) by mouth daily. 30 capsule 0 12/16/2014 at Unknown time  .  venlafaxine (EFFEXOR) 25 MG tablet Take 1 tablet (25 mg total) by mouth 2 (two) times daily with a meal. 30 tablet 3 12/16/2014 at Unknown time  . HYDROcodone-acetaminophen (NORCO/VICODIN) 5-325 MG per tablet Take 1-2 tablets by mouth every 4 (four) hours as needed. (Patient not taking: Reported on 11/30/2014) 20 tablet 0 Not Taking  . ondansetron (ZOFRAN ODT) 4 MG disintegrating tablet Take 1 tablet (4 mg total) by mouth every 8 (eight) hours as needed for nausea or vomiting. (Patient not taking: Reported on 12/04/2014) 20 tablet 0 Not Taking  . phenazopyridine (PYRIDIUM) 100 MG tablet Take 1 tablet (100 mg total) by mouth 3 (three) times daily with meals. (Patient not taking: Reported on 12/17/2014) 10 tablet 0 Not Taking  . traMADol (ULTRAM) 50 MG tablet Take 1 tablet (50 mg total) by mouth every 8 (eight) hours as needed. (Patient not taking: Reported on 12/17/2014) 30 tablet 0 Not Taking   Scheduled: . docusate sodium  250 mg Oral Daily  . [START ON 12/23/2014] fludrocortisone  0.3 mg Oral Daily  . gabapentin  300 mg Oral TID  . heparin  5,000 Units Subcutaneous 3 times per day  . hydrocortisone  10 mg Oral Daily  . hydrocortisone  5 mg Oral BID  . meloxicam  7.5 mg Oral Daily  . sulfamethoxazole-trimethoprim  1 tablet Oral BID  . venlafaxine  25 mg Oral BID WC     ROS:  History obtained from the patient  General ROS: negative for - chills, fatigue, fever, night sweats, weight gain or weight loss Psychological ROS: negative for - behavioral disorder, hallucinations, memory difficulties, mood swings or suicidal ideation Ophthalmic ROS: negative for - blurry vision, double vision, eye pain or loss of vision ENT ROS: negative for - epistaxis, nasal discharge, oral lesions, sore throat, tinnitus or vertigo Allergy and Immunology ROS: negative for - hives or  itchy/watery eyes Hematological and Lymphatic ROS: negative for - bleeding problems, bruising or swollen lymph nodes Endocrine ROS: negative for - galactorrhea, hair pattern changes, polydipsia/polyuria or temperature intolerance Respiratory ROS: negative for - cough, hemoptysis, shortness of breath or wheezing Cardiovascular ROS: negative for - chest pain, dyspnea on exertion, edema or irregular heartbeat Gastrointestinal ROS: negative for - abdominal pain, diarrhea, hematemesis, nausea/vomiting or stool incontinence Genito-Urinary ROS: negative for - dysuria, hematuria, incontinence or urinary frequency/urgency Musculoskeletal ROS: negative for - joint swelling or muscular weakness Neurological ROS: as noted in HPI Dermatological ROS: negative for rash and skin lesion changes   Blood pressure 138/75, pulse 102, temperature 98.6 F (37 C), temperature source Oral, resp. rate 16, height 5\' 11"  (1.803 m), weight 91.2 kg (201 lb 1 oz), SpO2 100 %.   Neurologic Examination:                                                                                                      HEENT-  Normocephalic, no lesions, without obvious abnormality.  Normal external eye and conjunctiva.  Normal TM's bilaterally.  Normal auditory canals and external ears. Normal external nose, mucus membranes and septum.  Normal pharynx. Cardiovascular- S1, S2 normal, pulses palpable throughout   Lungs- chest clear, no wheezing, rales, normal symmetric air entry Abdomen- normal findings: bowel sounds normal Extremities- no edema Lymph-no adenopathy palpable Musculoskeletal-no joint tenderness, deformity or swelling Skin-warm and dry, no hyperpigmentation, vitiligo, or suspicious lesions  Neurological Examination Mental Status: Alert, oriented, thought content appropriate.  Speech fluent without evidence of aphasia.  Able to follow 3 step commands without difficulty. Cranial Nerves: II: Discs flat bilaterally; Visual  fields grossly normal, pupils equal, round, reactive to light and accommodation III,IV, VI: ptosis not present, extra-ocular motions intact bilaterally V,VII: smile symmetric, facial light touch sensation normal bilaterally VIII: hearing normal bilaterally IX,X: uvula rises symmetrically XI: bilateral shoulder shrug XII: midline tongue extension Motor: Right : Upper extremity   5/5    Left:     Upper extremity   5/5  Lower extremity   5/5     Lower extremity   5/5 Tone and bulk:normal tone throughout; no atrophy noted Sensory: Pinprick and light touch intact throughout, bilaterally Deep Tendon Reflexes: 2+ and symmetric throughout UE 1+ bilateral KJ and no AJ.   Plantars: Right: downgoing   Left: downgoing Cerebellar: normal finger-to-nose, and normal heel-to-shin test Gait: not tested due to patient safety      Lab Results: Basic Metabolic Panel:  Recent Labs Lab 12/17/14 1134 12/17/14 1515 12/18/14 0730 12/19/14 1018 12/21/14 0500 12/22/14 1308  NA 131*  --  132*  132* 136 134*  K 3.9  --  3.7 3.2* 3.0* 3.7  CL 97*  --  101 100* 102 102  CO2 25  --  22 21* 25 22  GLUCOSE 107*  --  106* 138* 101* 124*  BUN 8  --  <5*  CREATININE 0.85 0.81 0.89 0.77 0.84 0.82  CALCIUM 9.6  --  8.7* 8.7* 9.3 9.1    Liver Function Tests:  Recent Labs Lab 12/18/14 0730  AST 20  ALT 23  ALKPHOS 50  BILITOT 0.4  PROT 5.8*  ALBUMIN 2.8*   No results for input(s): LIPASE, AMYLASE in the last 168 hours. No results for input(s): AMMONIA in the last 168 hours.  CBC:  Recent Labs Lab 12/17/14 1134 12/17/14 1515 12/18/14 0730 12/19/14 1018 12/20/14 0543 12/21/14 0500  WBC 4.9 3.1* 2.8* 4.2 3.2* 4.1  NEUTROABS 3.4  --   --  2.8  --   --   HGB 10.4* 11.9* 10.3* 10.9* 10.5* 11.3*  HCT 29.5* 34.1* 29.6* 30.5* 29.9* 31.8*  MCV 95.8 99.1 97.4 95.0 94.9 94.6  PLT 221 171 193 215 225 245    Cardiac Enzymes: No results for input(s): CKTOTAL, CKMB, CKMBINDEX, TROPONINI in  the last 168 hours.  Lipid Panel: No results for input(s): CHOL, TRIG, HDL, CHOLHDL, VLDL, LDLCALC in the last 168 hours.  CBG: No results for input(s): GLUCAP in the last 168 hours.  Microbiology: Results for orders placed or performed during the hospital encounter of 12/04/14  Culture, Urine     Status: None   Collection Time: 12/04/14  3:29 PM  Result Value Ref Range Status   Specimen Description URINE, CLEAN CATCH  Final   Special Requests NONE  Final   Culture 5,000 COLONIES/mL INSIGNIFICANT GROWTH  Final   Report Status 12/06/2014 FINAL  Final    Coagulation Studies: No results for input(s): LABPROT, INR in the last 72 hours.  Imaging: No results found.     Assessment and plan per attending neurologist  Felicie Morn PA-C [facility] [phone]  12/22/2014, 2:05 PM   Assessment/Plan:  49 yo M with autonomic dysfunction and exam concerning for periopheral neuropathy. I am concerned that he may have an autonomic neuropathy, though adrenal dysfunction certainly could be causing his symptoms as well. Diabetes is the most common cause of autonomic neuropathy, but this seems too rapid. I think it also seems to rapid for multiple system atrophy.    1) B12, SPEP, ACE, SSA, SSB, hiv 2) EMG/NCS would be helpful, but cannot be done here.   Ritta Slot, MD [facility] [phone]  If 7pm- 7am, please page neurology on call as listed in AMION.

## 2014-12-23 LAB — PROTEIN ELECTROPHORESIS, SERUM
A/G Ratio: 0.9 (ref 0.7–1.7)
Albumin ELP: 3 g/dL (ref 2.9–4.4)
Alpha-1-Globulin: 0.3 g/dL (ref 0.0–0.4)
Alpha-2-Globulin: 0.8 g/dL (ref 0.4–1.0)
Beta Globulin: 1.2 g/dL (ref 0.7–1.3)
Gamma Globulin: 1 g/dL (ref 0.4–1.8)
Globulin, Total: 3.2 g/dL (ref 2.2–3.9)
Total Protein ELP: 6.2 g/dL (ref 6.0–8.5)

## 2014-12-23 LAB — SJOGRENS SYNDROME-B EXTRACTABLE NUCLEAR ANTIBODY: SSB (La) (ENA) Antibody, IgG: <0.2 AI (ref 0.0–0.9)

## 2014-12-23 LAB — ANGIOTENSIN CONVERTING ENZYME: Angiotensin-Converting Enzyme: 26 U/L (ref 14–82)

## 2014-12-23 LAB — HIV ANTIBODY (ROUTINE TESTING W REFLEX): HIV Screen 4th Generation wRfx: NONREACTIVE

## 2014-12-23 LAB — SJOGRENS SYNDROME-A EXTRACTABLE NUCLEAR ANTIBODY: SSA (Ro) (ENA) Antibody, IgG: <0.2 AI (ref 0.0–0.9)

## 2014-12-23 MED ORDER — MORPHINE SULFATE 15 MG PO TABS: 15.0000 mg | ORAL_TABLET | Freq: Four times a day (QID) | ORAL | Status: DC | PRN

## 2014-12-23 MED ORDER — FLUDROCORTISONE ACETATE 0.1 MG PO TABS: 0.3000 mg | ORAL_TABLET | Freq: Every day | ORAL | Status: DC

## 2014-12-23 MED ORDER — HYDROCORTISONE 5 MG PO TABS: ORAL_TABLET | ORAL | Status: DC

## 2014-12-23 NOTE — Care Management Note (Signed)
Case Management Note  Patient Details  Name: Jimmy [name] MRN: [id number] Date of Birth: Jun 04, 1965  Subjective/Objective:     CM following for progression and d/c planning.               Action/Plan: 12/23/2014 Met with pt who is requesting rolling walker per MD recommendation, order placed and [facility] notified of need for walker and plan to d/c to Home this afternoon.  Expected Discharge Date:       12/23/2014           Expected Discharge Plan:  Home/Self Care  In-House Referral:  NA  Discharge planning Services  CM Consult  Post Acute Care Choice:  Durable Medical Equipment Choice offered to:  Patient  DME Arranged:  Gilford Rile rolling DME Agency:  [place]:  NA [place] Agency:     Status of Service:     Medicare Important Message Given:  Yes-fourth notification given Date Medicare IM Given:    Medicare IM give by:    Date Additional Medicare IM Given:    Additional Medicare Important Message give by:     If discussed at [place] of Stay Meetings, dates discussed:    Additional Comments:  Adron Bene, RN 12/23/2014, 1:26 PM

## 2014-12-23 NOTE — Telephone Encounter (Signed)
[facility] was called. They have the appointment for patient to be seen tomorrow morning for follow-up of his urinary retention. The resident at the hospital was called and they are aware of the appointment.

## 2014-12-23 NOTE — Discharge Instructions (Signed)
1. Take one Florinef 0.3 mg daily in the morning. Take 10 mg of Hydrocortisone in the morning, 5 mg in the afternoon, and 5 mg in the evening. These are your medications for orthostatic hypotension.  2. Finish the course of Bactrim for your prostatitis.  3. Contact Dr. Cleta Alberts if you do not hear from him regarding outpatient referrals to endocrinology, urology, and neurology.  4. Use a walker for safety with walking. Make sure you have plenty of help around the house.   Dr. Ellis Parents office has made a referral for Urology with Dr. Sherron Monday tomorrow, 8/25, at 2 PM. His office is actively working on getting a referral with Endocrinology. Please contact his office for details.

## 2014-12-23 NOTE — Progress Notes (Signed)
Subjective: Continues to be orthostatic when standing. 125/76 laying, 105/76 standing, 62/26 standing  Exam: Filed Vitals:   12/23/14 0519  BP:   Pulse: 92  Temp: 97.6 F (36.4 C)  Resp: 16    HEENT-  Normocephalic, no lesions, without obvious abnormality.  Normal external eye and conjunctiva.  Normal TM's bilaterally.  Normal auditory canals and external ears. Normal external nose, mucus membranes and septum.  Normal pharynx. Cardiovascular- S1, S2 normal, pulses palpable throughout   Lungs- chest clear, no wheezing, rales, normal symmetric air entry, Heart exam - S1, S2 normal, no murmur, no gallop, rate regular Abdomen- normal findings: bowel sounds normal Extremities- no edema Lymph-no adenopathy palpable Musculoskeletal-no joint tenderness, deformity or swelling Skin-warm and dry, no hyperpigmentation, vitiligo, or suspicious lesions    Gen: In bed, NAD MS: alert and oriented, follows all commands.  Speech clear.  CN: PERRLA, EOMI, TML, FACE equal and symmetric.  Motor: moving all extremities with full strength Sensory: grossly intact.    Pertinent Labs: B12 378 HIV (-) ACE, SPEP, SSA, SSB --pending  Felicie Morn PA-C [facility] [phone]  Impression: 49 yo M with autonomic dysfunction.    Recommendations: Awaiting SPEP, SSB, SSA, ACE Will continue to follow.     12/23/2014, 10:11 AM

## 2014-12-23 NOTE — Telephone Encounter (Signed)
Dr. Cleta Alberts [facility] has an appt for this pt on January 26, 2015.  I advised her that the pt was in the hospital and he needed to be seen for urinary retention.  Dr. Cleta Alberts please advise this date which is a month away.

## 2014-12-23 NOTE — Progress Notes (Signed)
Family Medicine Teaching Service Daily Progress Note Intern Pager: [phone]  Patient name: Jimmy [name] Medical record number: [id number] Date of birth: Sep 03, 1965 Age: 49 y.o. Gender: male  Primary Care Provider: Lucilla Edin, MD Consultants: none Code Status: full  Pt Overview and Major Events to Date:  8/18: Admit for orthostatic hypotension and syncopal event; CT head negative   Assessment and Plan: Jimmy [name] is a 49 y.o. male presenting from clinic for orthostatic hypotension and syncopal event . PMH is significant for recent admission for prostatitis, mild adrenal insufficiency, POTS, Herpes zoster ophthalmicus in June, history of spinal stenosis and low back pain s/p surgery  Orthostatic Hypotension due to POTS-  Mildly adrenal insufficient with random cortisol of 4.5 orthostatics with symptoms of dizziness. CT head performed to r/o intracranial process (8/18) ACTH stim test performed and patient had an appropriate rise in Cortisol within 30 minutes  - Orthostatics daily - dropping 70s-60s/40s-30s - [facility], patient on wait-list for bed; [facility] unable to accept  [facility] endocrinology recs: continue patient on Florinef 0.3 mg, Hydrocortisone, and TED hoses  again for further recs, then consider touching base with neurology  -Neuro consult: Autonomic neuropathy vs. Adrenal dysfunction; ordered B12, SPEP, ACE, SSA, SSB, hiv; suggested that EMG/NCS would be helpful but can't be performed here  -[facility] has requested endocrinology and urology outpatient referrals    Chronic low back pain and abdominal pain- Stable and well controlled on home regimen of Morphine PO 15 mg q6hr - continue home regimen  Prostatitis- He was still on bactrim as prescribed to continue a 6 week course. He was afebrile on presentation and did not report fevers at home. His low back pain and lower abdominal pain were stable from prior admission - continue bactrim 800-160 BID to complete 6 week  course  Urinary retention s/p indwelling foley catheter. UA negative - voiding trial performed without issue, patient without voiding issues  - Would like a urology referral upon discharge  Concern for weight loss- Stable weight since 8/5 was 195, 193 - Cotinue to monitor weights  Constipation- Required an enema during last inpatient admission and has since been taking Miralax and Colace with stable constipation symptoms.  - continue Miralax/Colace  T2DM- New diagnosis with A1c 6.5 on 12/04/2014.  - Continue SSI and CBG ACHS  FEN/GI: Reg diet, SLIV Prophylaxis: heparin subq  Disposition: Floor, patient would like referrals to urology and endocrinology before dispo. Awaiting bed placement at [facility], but consider d/c if BP stable    Subjective: Reports continued dizziness with standing. Ambulating with walker. Last BM two days ago, states that is normal for him.   Objective: Temp:  [97.6 F (36.4 C)-98.6 F (37 C)] 97.6 F (36.4 C) (08/24 0519) Pulse Rate:  [91-93] 92 (08/24 0519) Resp:  [16-19] 16 (08/24 0519) BP: (133-140)/(68-79) 140/79 mmHg (08/23 2021) SpO2:  [100 %] 100 % (08/24 0519) Weight:  [202 lb 9.6 oz (91.9 kg)] 202 lb 9.6 oz (91.9 kg) (08/23 2021)  Physical Exam: General: NAD,  wife at bedside Cardiovascular: RRR, no murmurs appreciated Respiratory: CTAB, no wheezes, rales or crackles Abdomen: soft, ND, RUQ mildly TTP.  Extremities: no edema in LE; wearing TED hose     Laboratory:  Recent Labs Lab 12/19/14 1018 12/20/14 0543 12/21/14 0500  WBC 4.2 3.2* 4.1  HGB 10.9* 10.5* 11.3*  HCT 30.5* 29.9* 31.8*  PLT 215 225 245    Recent Labs Lab 12/18/14 0730 12/19/14 1018 12/21/14 0500 12/22/14 1308  NA 132* 132* 136 134*  K 3.7 3.2* 3.0* 3.7  CL 101 100* 102 102  CO2 22 21* 25 22  BUN 9 6 6  <5*  CREATININE 0.89 0.77 0.84 0.82  CALCIUM 8.7* 8.7* 9.3 9.1  PROT 5.8*  --   --   --   BILITOT 0.4  --   --   --   ALKPHOS 50  --   --   --   ALT 23  --    --   --   AST 20  --   --   --   GLUCOSE 106* 138* 101* 124*    ACTH Stimulation Test  Cortisol, Base ug/dL 4.4   Comments: NO NORMAL RANGE ESTABLISHED FOR THIS TEST   Cortisol, 30 Min ug/dL 13.0   Cortisol, 60 Min ug/dL 86.5         Imaging/Diagnostic Tests: No results found.   Arvilla Market, DO 12/23/2014, 8:06 AM PGY-2, [facility] FPTS Intern pager: [phone], text pages welcome

## 2014-12-24 DIAGNOSIS — R3912 Poor urinary stream: Secondary | ICD-10-CM | POA: Diagnosis not present

## 2014-12-24 DIAGNOSIS — R351 Nocturia: Secondary | ICD-10-CM | POA: Diagnosis not present

## 2014-12-24 NOTE — Telephone Encounter (Signed)
Another message regarding this in Epic.

## 2015-01-03 ENCOUNTER — Ambulatory Visit (INDEPENDENT_AMBULATORY_CARE_PROVIDER_SITE_OTHER): Payer: Medicare Other | Admitting: Emergency Medicine

## 2015-01-03 VITALS — BP 72/60 | HR 104 | Temp 97.5°F | Resp 12 | Ht 70.0 in | Wt 190.4 lb

## 2015-01-03 DIAGNOSIS — R000 Tachycardia, unspecified: Secondary | ICD-10-CM | POA: Diagnosis not present

## 2015-01-03 DIAGNOSIS — R1031 Right lower quadrant pain: Secondary | ICD-10-CM | POA: Diagnosis not present

## 2015-01-03 DIAGNOSIS — R634 Abnormal weight loss: Secondary | ICD-10-CM

## 2015-01-03 DIAGNOSIS — R739 Hyperglycemia, unspecified: Secondary | ICD-10-CM

## 2015-01-03 DIAGNOSIS — I951 Orthostatic hypotension: Secondary | ICD-10-CM

## 2015-01-03 LAB — BASIC METABOLIC PANEL WITH GFR
BUN: 15 mg/dL (ref 7–25)
CO2: 23 mmol/L (ref 20–31)
Calcium: 9.1 mg/dL (ref 8.6–10.3)
Chloride: 100 mmol/L (ref 98–110)
Creat: 0.96 mg/dL (ref 0.60–1.35)
GFR, Est African American: >89 mL/min (ref >=60)
GFR, Est Non African American: >89 mL/min (ref >=60)
Glucose, Bld: 131 mg/dL — ABNORMAL HIGH (ref 65–99)
Potassium: 4 mmol/L (ref 3.5–5.3)
Sodium: 131 mmol/L — ABNORMAL LOW (ref 135–146)

## 2015-01-03 LAB — POCT CBC
Granulocyte percent: 64.7 %G (ref 37–80)
HCT, POC: 36 % — AB (ref 43.5–53.7)
Hemoglobin: 11.5 g/dL — AB (ref 14.1–18.1)
Lymph, poc: 1 (ref 0.6–3.4)
MCH, POC: 31.2 pg (ref 27–31.2)
MCHC: 32.1 g/dL (ref 31.8–35.4)
MCV: 97.3 fL — AB (ref 80–97)
MID (cbc): 0.3 (ref 0–0.9)
MPV: 7.1 fL (ref 0–99.8)
POC Granulocyte: 2.4 (ref 2–6.9)
POC LYMPH PERCENT: 26.2 %L (ref 10–50)
POC MID %: 9.1 %M (ref 0–12)
Platelet Count, POC: 242 10*3/uL (ref 142–424)
RBC: 3.7 M/uL — AB (ref 4.69–6.13)
RDW, POC: 12.8 %
WBC: 3.7 10*3/uL — AB (ref 4.6–10.2)

## 2015-01-03 MED ORDER — MORPHINE SULFATE 15 MG PO TABS: 15.0000 mg | ORAL_TABLET | Freq: Four times a day (QID) | ORAL | Status: DC | PRN

## 2015-01-03 NOTE — Progress Notes (Addendum)
This chart was scribed for [name], MD by Stann Ore, Medical Scribe. This patient was seen in Room 1 and the patient's care was started 8:32 AM.  Chief Complaint:  Chief Complaint  Patient presents with  . Follow-up    Hospital Follow up  . Hypotension  . Medication Refill    Morphine  . Referral    HPI: Jimmy [name] is a 49 y.o. male who reports to [facility] today for hospital follow up. He was recently admitted to the hospital 10 days ago (his 3rd admission) and was referred to see a neurologist. Since then, he mostly sits and lays down at home. He felt really dizzy when he was walking from his bed to a seat in the shower to wash up this morning. He says that he would get dizzy and see spots after standing up for a short amount of time. When he's laying down, BP recheck in the room 94/70. When he's sitting up, BP recheck in the room 72/60. He denies a big appetite but has been eating. He denies syncope.   He saw urologist with normal check-up.  He plans to see endocrinologist on Sept. 28th.     Past Medical History  Diagnosis Date  . Back pain    Past Surgical History  Procedure Laterality Date  . Back surgery     Social History   Social History  . Marital Status: Married    Spouse Name: N/A  . Number of Children: N/A  . Years of Education: N/A   Social History Main Topics  . Smoking status: Former Smoker    Quit date: 05/02/2007  . Smokeless tobacco: Never Used  . Alcohol Use: No  . Drug Use: No  . Sexual Activity: Yes   Other Topics Concern  . None   Social History Narrative   Family History  Problem Relation Age of Onset  . Heart disease Maternal Grandmother   . Cancer Maternal Grandfather    Allergies  Allergen Reactions  . Antihistamines, Chlorpheniramine-Type Other (See Comments)    Hallucinations  . Other     pyridium  . Phenazopyridine Hives and Other (See Comments)    Pyridium. Dizziness and indigestion   Prior to Admission  medications   Medication Sig Start Date End Date Taking? Authorizing Provider  docusate sodium (COLACE) 250 MG capsule Take 1 capsule (250 mg total) by mouth daily. 12/04/14  Yes Tishira R Brewington, PA-C  fludrocortisone (FLORINEF) 0.1 MG tablet Take 3 tablets (0.3 mg total) by mouth daily. 12/23/14  Yes Hillary Percell Boston, MD  gabapentin (NEURONTIN) 300 MG capsule Take 1 capsule (300 mg total) by mouth 3 (three) times daily. 12/13/14  Yes Alyssa A Kennon Rounds, MD  hydrocortisone (CORTEF) 5 MG tablet Take 2 tablets (10mg ) daily at 7am, take 5mg  daily with lunch, and take 5mg  daily at 4pm 12/23/14  Yes Hillary Percell Boston, MD  meloxicam (MOBIC) 7.5 MG tablet Take 1 tablet (7.5 mg total) by mouth daily. 12/13/14  Yes Alyssa A Kennon Rounds, MD  morphine (MSIR) 15 MG tablet Take 1 tablet (15 mg total) by mouth every 6 (six) hours as needed for severe pain. 12/13/14  Yes Alyssa A Kennon Rounds, MD  sulfamethoxazole-trimethoprim (BACTRIM DS,SEPTRA DS) 800-160 MG per tablet Take 1 tablet by mouth 2 (two) times daily. Patient taking differently: Take 1 tablet by mouth 2 (two) times daily. 42 day course started 12/03/14 pm 12/03/14  Yes Asiyah Mayra Reel, MD  venlafaxine (EFFEXOR) 25 MG tablet Take  1 tablet (25 mg total) by mouth 2 (two) times daily with a meal. 12/13/14  Yes Alyssa A Haney, MD  baclofen (LIORESAL) 10 MG tablet Take 0.5 tablets (5 mg total) by mouth 3 (three) times daily as needed for muscle spasms. Patient not taking: Reported on 01/03/2015 12/13/14   Bonney Aid, MD  morphine (MSIR) 15 MG tablet Take 1 tablet (15 mg total) by mouth every 6 (six) hours as needed for severe pain. Patient not taking: Reported on 01/03/2015 12/23/14   Casey Burkitt, MD  ondansetron (ZOFRAN ODT) 4 MG disintegrating tablet Take 1 tablet (4 mg total) by mouth every 8 (eight) hours as needed for nausea or vomiting. Patient not taking: Reported on 12/04/2014 11/30/14   Francee Piccolo, PA-C  polyethylene glycol powder  (GLYCOLAX/MIRALAX) powder Take 17 g by mouth 2 (two) times daily as needed. Patient taking differently: Take 17 g by mouth 2 (two) times daily as needed (constipation).  12/04/14   Tishira R Brewington, PA-C     ROS: The patient denies fevers, chills, night sweats, unintentional weight loss, chest pain, palpitations, wheezing, dyspnea on exertion, nausea, vomiting, abdominal pain, dysuria, hematuria, melena, numbness, syncope, weakness, or tingling. Has dizziness, appetite loss  All other systems have been reviewed and were otherwise negative with the exception of those mentioned in the HPI and as above.    PHYSICAL EXAM: Filed Vitals:   01/03/15 0840  BP: 72/60  Pulse:   Temp:   Resp:    Body mass index is 27.32 kg/(m^2).   General: alert and cooperative if he's laying down, no acute distress HEENT:  Normocephalic, atraumatic, oropharynx patent. Eye: Nonie Hoyer [facility] Cardiovascular:  Regular rate and rhythm, no rubs murmurs or gallops.  No Carotid bruits, radial pulse intact. No pedal edema.  Respiratory: Clear to auscultation bilaterally.  No wheezes, rales, or rhonchi.  No cyanosis, no use of accessory musculature Abdominal: No organomegaly, abdomen is soft and non-tender, positive bowel sounds.  No masses. Musculoskeletal: Gait intact. No edema, tenderness Skin: No rashes. Neurologic: Facial musculature symmetric. Psychiatric: Patient acts appropriately throughout our interaction.  Lymphatic: No cervical or submandibular lymphadenopathy Genitourinary/Anorectal: No acute findings   LABS: Results for orders placed or performed in visit on 01/03/15  POCT CBC  Result Value Ref Range   WBC 3.7 (A) 4.6 - 10.2 K/uL   Lymph, poc 1.0 0.6 - 3.4   POC LYMPH PERCENT 26.2 10 - 50 %L   MID (cbc) 0.3 0 - 0.9   POC MID % 9.1 0 - 12 %M   POC Granulocyte 2.4 2 - 6.9   Granulocyte percent 64.7 37 - 80 %G   RBC 3.70 (A) 4.69 - 6.13 M/uL   Hemoglobin 11.5 (A) 14.1 - 18.1 g/dL   HCT, POC 28.4  (A) 13.2 - 53.7 %   MCV 97.3 (A) 80 - 97 fL   MCH, POC 31.2 27 - 31.2 pg   MCHC 32.1 31.8 - 35.4 g/dL   RDW, POC 44.0 %   Platelet Count, POC 242 142 - 424 K/uL   MPV 7.1 0 - 99.8 fL     EKG/XRAY:   Primary read interpreted by Dr. Cleta Alberts at [facility].   ASSESSMENT/PLAN:  Very complicated history. This patient has significant orthostatic hypotension. He also has chronic back and abdominal pain. He has been evaluated by cardiology and neurology. There are concerns about endocrine efficiency. He is currently on hydrocortisone 20 mg a day and Florinef 0.3. He has been to  see urology due to his urinary retention issues. He has an outpatient referral to endocrine. I have placed a referral to neurology. It was suggested in the hospital he had EMG nerve conduction studies. I will also do some investigation as to whether there are any tertiary care hospitals in the area which deal with autonomic dysfunction.I personally performed the services described in this documentation, which was scribed in my presence. The recorded information has been reviewed and is accurate. Sodium returned slightly low and sugar is high. We'll recheck labs in 2 weeks.  Gross sideeffects, risk and benefits, and alternatives of medications d/w patient. Patient is aware that all medications have potential sideeffects and we are unable to predict every sideeffect or drug-drug interaction that may occur.  [name] MD 01/03/2015 9:44 AM

## 2015-01-11 ENCOUNTER — Ambulatory Visit (INDEPENDENT_AMBULATORY_CARE_PROVIDER_SITE_OTHER): Payer: Medicare Other | Admitting: Diagnostic Neuroimaging

## 2015-01-11 ENCOUNTER — Encounter: Payer: Self-pay | Admitting: Diagnostic Neuroimaging

## 2015-01-11 VITALS — BP 82/57 | HR 120 | Ht 71.0 in | Wt 190.0 lb

## 2015-01-11 DIAGNOSIS — K5900 Constipation, unspecified: Secondary | ICD-10-CM | POA: Diagnosis not present

## 2015-01-11 DIAGNOSIS — R531 Weakness: Secondary | ICD-10-CM

## 2015-01-11 DIAGNOSIS — R339 Retention of urine, unspecified: Secondary | ICD-10-CM

## 2015-01-11 DIAGNOSIS — I951 Orthostatic hypotension: Secondary | ICD-10-CM

## 2015-01-11 NOTE — Patient Instructions (Signed)
I will check MRI scans.  Drink plenty of water. Add salt to your diet. Incline the head of bed 2-3 inches with wood or blocks.  Measure blood pressure at home.

## 2015-01-11 NOTE — Progress Notes (Signed)
[facility]  PATIENT: Jimmy [name] DOB: Apr 05, 1966  REFERRING CLINICIAN: Daub, S HISTORY FROM: patient and wife  REASON FOR VISIT: new consult    HISTORICAL  CHIEF COMPLAINT:  Chief Complaint  Patient presents with  . Orthostatic hypotension    rm 6, New Patient, wife -[name]    HISTORY OF PRESENT ILLNESS:   49 year old left-handed male here for evaluation of orthostatic hypotension, dysautonomia.  In June patient had left C1 herpes zoster ophthalmicus, treated with antivirals therapy. In July patient began to have intermittent episodes of hypotension, dizziness, urinary retention and ultimately leading to syncope. Patient was evaluated at the hospital 3 times including most recent admission with discharge on 12/23/2014. During this time he has been diagnosed with prostatitis, mild renal insufficiency, postural orthostatic tachycardia syndrome. He was tried on Midrin and Florinef, and ultimately discharged on Florinef plus hydrocortisone. Outpatient neurology follow-up is pending. For prostatitis he was discharged on anti-biotics to complete a 6 week course. He also has urology follow-up for urinary retention. Patient also set up for follow-up in my neurology clinic today.  Patient still having significant problems standing and walking. He also has chronic low back pain, history of spinal stenosis status post surgery. Patient has had back surgeries in 2002 and 2009. He has chronic pain syndrome. Is on long-term narcotic medications.    REVIEW OF SYSTEMS: Full 14 system review of systems performed and notable only for Spring sensation itching sensation weight loss fatigue shortness of breath anemia feeling cold increased thirst joint swelling joint pain aching muscles urination problems constipation dizziness erectile dysfunction numbness weakness passing out not asleep decreased energy change in appetite disinterest in activities.  ALLERGIES: Allergies  Allergen  Reactions  . Antihistamines, Chlorpheniramine-Type Other (See Comments)    Hallucinations  . Other     pyridium  . Phenazopyridine Hives and Other (See Comments)    Pyridium. Dizziness and indigestion    HOME MEDICATIONS: Outpatient Prescriptions Prior to Visit  Medication Sig Dispense Refill  . docusate sodium (COLACE) 250 MG capsule Take 1 capsule (250 mg total) by mouth daily. 10 capsule 0  . fludrocortisone (FLORINEF) 0.1 MG tablet Take 3 tablets (0.3 mg total) by mouth daily. 30 tablet 0  . gabapentin (NEURONTIN) 300 MG capsule Take 1 capsule (300 mg total) by mouth 3 (three) times daily. 30 capsule 3  . hydrocortisone (CORTEF) 5 MG tablet Take 2 tablets ( ) daily at 7am, take  daily with lunch, and take  daily at 4pm 120 tablet 0  . meloxicam (MOBIC) 7.5 MG tablet Take 1 tablet (7.5 mg total) by mouth daily. 30 tablet 3  . morphine (MSIR) 15 MG tablet Take 1 tablet (15 mg total) by mouth every 6 (six) hours as needed for severe pain. 60 tablet 0  . polyethylene glycol powder (GLYCOLAX/MIRALAX) powder Take 17 g by mouth 2 (two) times daily as needed. (Patient taking differently: Take 17 g by mouth 2 (two) times daily as needed (constipation). ) 3350 g 1  . sulfamethoxazole-trimethoprim (BACTRIM DS,SEPTRA DS) 800-160 MG per tablet Take 1 tablet by mouth 2 (two) times daily. (Patient taking differently: Take 1 tablet by mouth 2 (two) times daily. 42 day course started 12/03/14 pm) 84 tablet 0  . venlafaxine (EFFEXOR) 25 MG tablet Take 1 tablet (25 mg total) by mouth 2 (two) times daily with a meal. 30 tablet 3  . morphine (MSIR) 15 MG tablet Take 1 tablet (15 mg total) by mouth every 6 (six) hours as  needed for severe pain. 30 tablet 0  . ondansetron (ZOFRAN ODT) 4 MG disintegrating tablet Take 1 tablet (4 mg total) by mouth every 8 (eight) hours as needed for nausea or vomiting. (Patient not taking: Reported on 01/11/2015) 20 tablet 0  . baclofen (LIORESAL) 10 MG tablet Take 0.5  tablets (5 mg total) by mouth 3 (three) times daily as needed for muscle spasms. (Patient not taking: Reported on 01/03/2015) 30 each 0   No facility-administered medications prior to visit.    PAST MEDICAL HISTORY: Past Medical History  Diagnosis Date  . Back pain   . DDD (degenerative disc disease), cervical   . Spinal stenosis   . Headache     migraines    PAST SURGICAL HISTORY: Past Surgical History  Procedure Laterality Date  . Back surgery  2002,2009    x 2, lower , job injury, spinal stenosis    FAMILY HISTORY: Family History  Problem Relation Age of Onset  . Heart disease Maternal Grandmother   . Cancer Maternal Grandfather   . Hypertension Mother     SOCIAL HISTORY:  Social History   Social History  . Marital Status: Married    Spouse Name: N/A  . Number of Children: 4  . Years of Education: 12   Occupational History  .      disabled   Social History Main Topics  . Smoking status: Former Smoker    Quit date: 05/02/2007  . Smokeless tobacco: Never Used  . Alcohol Use: No  . Drug Use: No  . Sexual Activity: Yes   Other Topics Concern  . Not on file   Social History Narrative   Lives at home with wife, children   Caffeine use- 1 soda a day     PHYSICAL EXAM  GENERAL EXAM/CONSTITUTIONAL: Vitals:  Filed Vitals:   01/11/15 1008 01/11/15 1021 01/11/15 1022  BP: 107/68 92/63 82/57   Pulse: 84 89 120  Height: 5\' 11"  (1.803 m)    Weight: 190 lb (86.183 kg)       Body mass index is 26.51 kg/(m^2).  Visual Acuity Screening   Right eye Left eye Both eyes  Without correction: 20/30 20/30   With correction:        Patient is in no distress; well developed, nourished and groomed; neck is supple  DRY MUCOUS MEMBRANES; GENERALIZED MALAISE/FRAIL APPEARANCE  CARDIOVASCULAR:  Examination of carotid arteries is normal; no carotid bruits  Regular rate and rhythm, no murmurs  Examination of peripheral vascular system by observation and  palpation is normal  EYES:  Ophthalmoscopic exam of optic discs and posterior segments is normal; no papilledema or hemorrhages  MUSCULOSKELETAL:  Gait, strength, tone, movements noted in Neurologic exam below  NEUROLOGIC: MENTAL STATUS:  No flowsheet data found.  awake, alert, oriented to person, place and time  recent and remote memory intact  normal attention and concentration  language fluent, comprehension intact, naming intact,   fund of knowledge appropriate  CRANIAL NERVE:   2nd - no papilledema on fundoscopic exam  2nd, 3rd, 4th, 6th - pupils equal and reactive to light, visual fields full to confrontation, extraocular muscles intact, no nystagmus  5th - facial sensation symmetric  7th - facial strength symmetric  8th - hearing intact  9th - palate elevates symmetrically, uvula midline  11th - shoulder shrug symmetric  12th - tongue protrusion midline  MOTOR:   normal bulk and tone; BUE 4; BLE (HF 3, KE/KF 4, DF 3)  SENSORY:  normal and symmetric to light touch, temperature, vibration; ABSENT VIB AT TOES  COORDINATION:   finger-nose-finger, fine finger movements SLOW  REFLEXES:   deep tendon reflexes TRACE and symmetric; ABSENT AT ANKLES  GAIT/STATION:   narrow based gait; USES A WALKER    DIAGNOSTIC DATA (LABS, IMAGING, TESTING) - I reviewed patient records, labs, notes, testing and imaging myself where available.  Lab Results  Component Value Date   WBC 3.7* 01/03/2015   HGB 11.5* 01/03/2015   HCT 36.0* 01/03/2015   MCV 97.3* 01/03/2015   PLT 245 12/21/2014      Component Value Date/Time   NA 131* 01/03/2015 0932   K 4.0 01/03/2015 0932   CL 100 01/03/2015 0932   CO2 23 01/03/2015 0932   GLUCOSE 131* 01/03/2015 0932   BUN 15 01/03/2015 0932   CREATININE 0.96 01/03/2015 0932   CREATININE 0.82 12/22/2014 1308   CALCIUM 9.1 01/03/2015 0932   PROT 5.8* 12/18/2014 0730   ALBUMIN 2.8* 12/18/2014 0730   AST 20 12/18/2014  0730   ALT 23 12/18/2014 0730   ALKPHOS 50 12/18/2014 0730   BILITOT 0.4 12/18/2014 0730   GFRNONAA >89 01/03/2015 0932   GFRNONAA >60 12/22/2014 1308   GFRAA >89 01/03/2015 0932   GFRAA >60 12/22/2014 1308   No results found for: CHOL, HDL, LDLCALC, LDLDIRECT, TRIG, CHOLHDL Lab Results  Component Value Date   HGBA1C 6.5* 12/04/2014   Lab Results  Component Value Date   VITAMINB12 378 12/22/2014   Lab Results  Component Value Date   TSH 1.145 12/04/2014    12/17/14 CT head [I reviewed images myself and agree with interpretation. -VRP]  - negative   12/08/14 MRI right hip - Negative examination. There is no evidence of septic hip. No abnormality to explain the patient's symptoms is identified.   11/30/14 MRI lumbar spine  1. Overall stable appearance of the lumbar spine with sequelae of prior decompressive laminectomy at L4-5. No recurrent stenosis at this level. 2. Congenital spinal stenosis with superimposed mild multilevel degenerative changes as above, overall relatively similar to prior MRI from 2011. No severe canal stenosis or evidence of cord compression. 3. Distended urinary bladder.    ASSESSMENT AND PLAN  49 y.o. year old male here with history of lumbar spinal stenosis, status post decompression surgery, chronic pain, now with progressive medical and neurologic decline since June 2016. Patient had left V1 herpes zoster ophthalmicus in June 2016 followed by progressive urinary retention, dizziness and now orthostatic intolerance/hypotension. 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 We'll check MRI of the brain, cervical thoracic spine to rule out other autoimmune, inflammatory etiologies.  Localization: autonomic nervous system, brain, cervical spine, thoracic spine  Ddx: autoimmune inflammatory, vascular, metabolic, neurodegenerative  Orthostatic hypotension - Plan: MR Brain W Wo Contrast, MR Cervical Spine W Wo Contrast, MR Thoracic Spine W Wo Contrast  Bladder  retention - Plan: MR Brain W Wo Contrast, MR Cervical Spine W Wo Contrast, MR Thoracic Spine W Wo Contrast  Constipation, unspecified constipation type - Plan: MR Brain W Wo Contrast, MR Cervical Spine W Wo Contrast, MR Thoracic Spine W Wo Contrast  Generalized weakness - Plan: MR Brain W Wo Contrast, MR Cervical Spine W Wo Contrast, MR Thoracic Spine W Wo Contrast    PLAN: - additional testing - continue florinef and hydrocortisone - follow up with endocrinology  Orders Placed This Encounter  Procedures  . MR Brain W Wo Contrast  . MR Cervical Spine W Wo Contrast  . MR  Thoracic Spine W Wo Contrast   Return in about 1 month (around 02/10/2015).  I reviewed images, labs, notes, records myself. I summarized findings and reviewed with patient, for this high risk condition (orthostatic hypotension; possible brain or spinal cord pathology) requiring high complexity decision making.   Suanne Marker, MD 01/11/2015, 11:23 AM Certified in Neurology, Neurophysiology and Neuroimaging  [facility] [address], [address] [place], [place] [address] [phone]

## 2015-01-17 ENCOUNTER — Ambulatory Visit (INDEPENDENT_AMBULATORY_CARE_PROVIDER_SITE_OTHER): Payer: Medicare Other | Admitting: Emergency Medicine

## 2015-01-17 VITALS — BP 106/62 | HR 122 | Temp 98.2°F | Resp 18 | Ht 71.0 in | Wt 184.0 lb

## 2015-01-17 DIAGNOSIS — R739 Hyperglycemia, unspecified: Secondary | ICD-10-CM

## 2015-01-17 DIAGNOSIS — I951 Orthostatic hypotension: Secondary | ICD-10-CM

## 2015-01-17 DIAGNOSIS — M549 Dorsalgia, unspecified: Secondary | ICD-10-CM | POA: Diagnosis not present

## 2015-01-17 DIAGNOSIS — R000 Tachycardia, unspecified: Secondary | ICD-10-CM | POA: Diagnosis not present

## 2015-01-17 DIAGNOSIS — G8929 Other chronic pain: Secondary | ICD-10-CM

## 2015-01-17 DIAGNOSIS — G909 Disorder of the autonomic nervous system, unspecified: Secondary | ICD-10-CM

## 2015-01-17 DIAGNOSIS — R7309 Other abnormal glucose: Secondary | ICD-10-CM | POA: Diagnosis not present

## 2015-01-17 DIAGNOSIS — R1031 Right lower quadrant pain: Secondary | ICD-10-CM

## 2015-01-17 LAB — BASIC METABOLIC PANEL WITH GFR
BUN: 14 mg/dL (ref 7–25)
CO2: 20 mmol/L (ref 20–31)
Calcium: 9.2 mg/dL (ref 8.6–10.3)
Chloride: 103 mmol/L (ref 98–110)
Creat: 1.07 mg/dL (ref 0.60–1.35)
GFR, Est African American: >89 mL/min (ref >=60)
GFR, Est Non African American: 82 mL/min (ref >=60)
Glucose, Bld: 121 mg/dL — ABNORMAL HIGH (ref 65–99)
Potassium: 3.8 mmol/L (ref 3.5–5.3)
Sodium: 133 mmol/L — ABNORMAL LOW (ref 135–146)

## 2015-01-17 LAB — GLUCOSE, POCT (MANUAL RESULT ENTRY): POC Glucose: 122 mg/dl — AB (ref 70–99)

## 2015-01-17 LAB — POCT CBC
Granulocyte percent: 53.2 %G (ref 37–80)
HCT, POC: 39.8 % — AB (ref 43.5–53.7)
Hemoglobin: 12.5 g/dL — AB (ref 14.1–18.1)
Lymph, poc: 1 (ref 0.6–3.4)
MCH, POC: 30.7 pg (ref 27–31.2)
MCHC: 31.4 g/dL — AB (ref 31.8–35.4)
MCV: 97.5 fL — AB (ref 80–97)
MID (cbc): 0.5 (ref 0–0.9)
MPV: 7.4 fL (ref 0–99.8)
POC Granulocyte: 1.7 — AB (ref 2–6.9)
POC LYMPH PERCENT: 32.6 %L (ref 10–50)
POC MID %: 14.2 %M — AB (ref 0–12)
Platelet Count, POC: 216 10*3/uL (ref 142–424)
RBC: 4.08 M/uL — AB (ref 4.69–6.13)
RDW, POC: 13.2 %
WBC: 3.2 10*3/uL — AB (ref 4.6–10.2)

## 2015-01-17 MED ORDER — HYDROCORTISONE 5 MG PO TABS: ORAL_TABLET | ORAL | Status: DC

## 2015-01-17 MED ORDER — MORPHINE SULFATE 15 MG PO TABS: 15.0000 mg | ORAL_TABLET | Freq: Four times a day (QID) | ORAL | Status: DC | PRN

## 2015-01-17 MED ORDER — FLUDROCORTISONE ACETATE 0.1 MG PO TABS: 0.3000 mg | ORAL_TABLET | Freq: Every day | ORAL | Status: DC

## 2015-01-17 NOTE — Progress Notes (Addendum)
This chart was scribed for [name], MD by Broadus John, Medical Scribe. This patient was seen in Room 13 and the patient's care was started at 9:45 AM.   Chief Complaint:  Chief Complaint  Patient presents with  . Follow-up    HPI: Jimmy [name] is a 49 y.o. male who reports to [facility] today for a follow up regarding his orthostatic hypotension.  Pt reports that he has been having more dizzy spells, difficulty walking for long periods of time, and decrease in appetite. He indicates that he has ran out of florinef recently. Pt states that he has followed up with a neurologist who believes that the pt's symptoms are neurological, and was ordered an MRI of his upper body. Pt has followed up with urology, and is to follow up with endocrinology on the 09/28. Pt's urology symptoms such as his urinary retention have not changed. Pt states that his pain level today is good since he is on medication, however usually it is very high. He indicates having an episode of sever sciatic nerve pain that subsided by itself. Otherwise pt states that his depression symptoms are under control. He reports that he has been on disability since 2013, and financially he is "getting by". Pt has last had a surgery about 7 years ago.   Pt is requesting a refill for morphine. Pt would not like to get a flu vaccine currently.    Past Medical History  Diagnosis Date  . Back pain   . DDD (degenerative disc disease), cervical   . Spinal stenosis   . Headache     migraines   Past Surgical History  Procedure Laterality Date  . Back surgery  2002,2009    x 2, lower , job injury, spinal stenosis   Social History   Social History  . Marital Status: Married    Spouse Name: N/A  . Number of Children: 4  . Years of Education: 12   Occupational History  .      disabled   Social History Main Topics  . Smoking status: Former Smoker    Quit date: 05/02/2007  . Smokeless tobacco: Never Used  . Alcohol Use:  No  . Drug Use: No  . Sexual Activity: Yes   Other Topics Concern  . None   Social History Narrative   Lives at home with wife, children   Caffeine use- 1 soda a day   Family History  Problem Relation Age of Onset  . Heart disease Maternal Grandmother   . Cancer Maternal Grandfather   . Hypertension Mother    Allergies  Allergen Reactions  . Antihistamines, Chlorpheniramine-Type Other (See Comments)    Hallucinations  . Other     pyridium  . Phenazopyridine Hives and Other (See Comments)    Pyridium. Dizziness and indigestion   Prior to Admission medications   Medication Sig Start Date End Date Taking? Authorizing Provider  docusate sodium (COLACE) 250 MG capsule Take 1 capsule (250 mg total) by mouth daily. 12/04/14   Tishira R Brewington, PA-C  fludrocortisone (FLORINEF) 0.1 MG tablet Take 3 tablets (0.3 mg total) by mouth daily. 12/23/14   Hillary Percell Boston, MD  gabapentin (NEURONTIN) 300 MG capsule Take 1 capsule (300 mg total) by mouth 3 (three) times daily. 12/13/14   Bonney Aid, MD  hydrocortisone (CORTEF) 5 MG tablet Take 2 tablets ( ) daily at 7am, take  daily with lunch, and take  daily at 4pm 12/23/14  Hillary Percell Boston, MD  meloxicam (MOBIC) 7.5 MG tablet Take 1 tablet (7.5 mg total) by mouth daily. 12/13/14   Bonney Aid, MD  morphine (MSIR) 15 MG tablet Take 1 tablet (15 mg total) by mouth every 6 (six) hours as needed for severe pain. 01/03/15   Collene Gobble, MD  ondansetron (ZOFRAN ODT) 4 MG disintegrating tablet Take 1 tablet (4 mg total) by mouth every 8 (eight) hours as needed for nausea or vomiting. Patient not taking: Reported on 01/11/2015 11/30/14   Francee Piccolo, PA-C  polyethylene glycol powder (GLYCOLAX/MIRALAX) powder Take 17 g by mouth 2 (two) times daily as needed. Patient taking differently: Take 17 g by mouth 2 (two) times daily as needed (constipation).  12/04/14   Tishira R Brewington, PA-C  sulfamethoxazole-trimethoprim  (BACTRIM DS,SEPTRA DS) 800-160 MG per tablet Take 1 tablet by mouth 2 (two) times daily. Patient taking differently: Take 1 tablet by mouth 2 (two) times daily. 42 day course started 12/03/14 pm 12/03/14   Asiyah Mayra Reel, MD  venlafaxine (EFFEXOR) 25 MG tablet Take 1 tablet (25 mg total) by mouth 2 (two) times daily with a meal. 12/13/14   Alyssa A Haney, MD     ROS: The patient has dizziness, gait problems, change in appetite, urinary retention.    All other systems have been reviewed and were otherwise negative with the exception of those mentioned in the HPI and as above.    PHYSICAL EXAM: Filed Vitals:   01/17/15 0936  BP: 106/62  Pulse: 122  Temp: 98.2 F (36.8 C)  Resp: 18   Body mass index is 25.67 kg/(m^2).   General: Patient cooperative in no distress. He is comfortable lying down but if he sits up becomes lightheaded and has to lie down immediately.atraumatic, oropharynx patent. Eye: [facility] Cardiovascular:  Regular rate and rhythm, no rubs murmurs or gallops.  No Carotid bruits, radial pulse intact. No pedal edema.  Respiratory: Clear to auscultation bilaterally.  No wheezes, rales, or rhonchi.  No cyanosis, no use of accessory musculature Abdominal: No organomegaly, abdomen is soft and non-tender, positive bowel sounds.  No masses. minimal tenderness right lower abdomen.  Musculoskeletal: Gait intact. No edema, tenderness Skin: No rashes. Neurologic: Facial musculature symmetric. Psychiatric: Patient acts appropriately throughout our interaction. Lymphatic: No cervical or submandibular lymphadenopathy Genitourinary/Anorectal: No acute findings Meds ordered this encounter  Medications  . fludrocortisone (FLORINEF) 0.1 MG tablet    Sig: Take 3 tablets (0.3 mg total) by mouth daily.    Dispense:  90 tablet    Refill:  11  . morphine (MSIR) 15 MG tablet    Sig: Take 1 tablet (15 mg total) by mouth every 6 (six) hours as needed for severe pain.    Dispense:  60  tablet    Refill:  0  . hydrocortisone (CORTEF) 5 MG tablet    Sig: Take 2 tablets ( ) daily at 7am, take  daily with lunch, and take  daily at 4pm    Dispense:  120 tablet    Refill:  3     LABS: Results for orders placed or performed in visit on 01/17/15  POCT CBC  Result Value Ref Range   WBC 3.2 (A) 4.6 - 10.2 K/uL   Lymph, poc 1.0 0.6 - 3.4   POC LYMPH PERCENT 32.6 10 - 50 %L   MID (cbc) 0.5 0 - 0.9   POC MID % 14.2 (A) 0 - 12 %M   POC Granulocyte 1.7 (  A) 2 - 6.9   Granulocyte percent 53.2 37 - 80 %G   RBC 4.08 (A) 4.69 - 6.13 M/uL   Hemoglobin 12.5 (A) 14.1 - 18.1 g/dL   HCT, POC 14.7 (A) 82.9 - 53.7 %   MCV 97.5 (A) 80 - 97 fL   MCH, POC 30.7 27 - 31.2 pg   MCHC 31.4 (A) 31.8 - 35.4 g/dL   RDW, POC 56.2 %   Platelet Count, POC 216 142 - 424 K/uL   MPV 7.4 0 - 99.8 fL  POCT glucose (manual entry)  Result Value Ref Range   POC Glucose 122 (A) 70 - 99 mg/dl     EKG/XRAY:   Primary read interpreted by Dr. Cleta Alberts at [facility].   ASSESSMENT/PLAN: 1. Orthostatic hypotension Patient ran out of his Florinef the last 2 days. It is essential he stay on this on a regular basis. - POCT CBC - POCT glucose (manual entry) - BASIC METABOLIC PANEL WITH GFR - fludrocortisone (FLORINEF) 0.1 MG tablet; Take 3 tablets (0.3 mg total) by mouth daily.  Dispense: 90 tablet; Refill: 11  2. Tachycardia This is similar to previous no changes. - POCT CBC - POCT glucose (manual entry) - BASIC METABOLIC PANEL WITH GFR  3. Autonomic dysfunction Patient has been to see the neurologist. He is scheduled for MRIs of the brain thoracic and cervical spine.  4. Right lower quadrant abdominal pain This has been controlled with morphine. - POCT CBC - POCT glucose (manual entry) - BASIC METABOLIC PANEL WITH GFR  5. Hyperglycemia Glucose is stable at 120. - POCT CBC - POCT glucose (manual entry) - BASIC METABOLIC PANEL WITH GFR  6. Back pain, chronic Symptoms controlled with  morphine.   I personally performed the services described in this documentation, which was scribed in my presence. The recorded information has been reviewed and is accurate.  [name], MD  [facility], [facility]  01/17/2015 10:34 AM   By signing my name below, I, Rawaa Al Rifaie, attest that this documentation has been prepared under the direction and in the presence of [name], MD.  Broadus John, Medical Scribe. 01/17/2015.  9:59 AM.     Michaell Cowing sideeffects, risk and benefits, and alternatives of medications d/w patient. Patient is aware that all medications have potential sideeffects and we are unable to predict every sideeffect or drug-drug interaction that may occur.  [name] MD 01/17/2015 9:45 AM

## 2015-01-27 ENCOUNTER — Ambulatory Visit (INDEPENDENT_AMBULATORY_CARE_PROVIDER_SITE_OTHER): Payer: Medicare Other | Admitting: Internal Medicine

## 2015-01-27 ENCOUNTER — Ambulatory Visit
Admission: RE | Admit: 2015-01-27 | Discharge: 2015-01-27 | Disposition: A | Discharge status: Home / Self Care | Payer: Medicare Other | Source: Ambulatory Visit | Attending: Diagnostic Neuroimaging | Admitting: Diagnostic Neuroimaging

## 2015-01-27 ENCOUNTER — Encounter: Payer: Self-pay | Admitting: Internal Medicine

## 2015-01-27 VITALS — BP 124/62 | HR 97 | Temp 98.1°F | Resp 14 | Ht 71.0 in | Wt 182.0 lb

## 2015-01-27 DIAGNOSIS — E2740 Unspecified adrenocortical insufficiency: Secondary | ICD-10-CM | POA: Diagnosis not present

## 2015-01-27 DIAGNOSIS — R339 Retention of urine, unspecified: Secondary | ICD-10-CM | POA: Diagnosis not present

## 2015-01-27 DIAGNOSIS — I951 Orthostatic hypotension: Secondary | ICD-10-CM

## 2015-01-27 DIAGNOSIS — K5900 Constipation, unspecified: Secondary | ICD-10-CM

## 2015-01-27 DIAGNOSIS — R7989 Other specified abnormal findings of blood chemistry: Secondary | ICD-10-CM

## 2015-01-27 DIAGNOSIS — R531 Weakness: Secondary | ICD-10-CM

## 2015-01-27 DIAGNOSIS — Z8639 Personal history of other endocrine, nutritional and metabolic disease: Secondary | ICD-10-CM | POA: Insufficient documentation

## 2015-01-27 MED ORDER — GADOBENATE DIMEGLUMINE 529 MG/ML IV SOLN
17.0000 mL | Freq: Once | INTRAVENOUS | Status: AC | PRN
  Administered 2015-01-27: 17 mL via INTRAVENOUS

## 2015-01-27 NOTE — Progress Notes (Signed)
Thank you so much for seeing Jimmy [name]. I know he is incredibly complicated. We will see what his neurology evaluation results are regarding autonomic dysfunction.

## 2015-01-27 NOTE — Progress Notes (Signed)
Patient ID: Jimmy [name], male   DOB: 28-Jan-1966, 49 y.o.   MRN: [id number]   HPI  Calub Tarnow is a 49 y.o.-year-old male, referred by his PCP, Dr. Cleta Alberts, for evaluation for suspicion for adrenal insufficiency. He is here with his wife who offers most of the history.  Pt was dx with shingles in 09/2014 >> tx with Zovirax (no steroids) >> lower AP, dizziness and urinary retention - in need of bladder catheterization >> developed prostatitis >> ED visit >> sent home >> urinary retention and pain increased >> came back to the hospital >> admitted >> low cortisol >> started Florinef 0.1 mg daily and Midodrine 5 mg tid >> saw PCP post d/c >> orthostatic hypotension >> admitted again >> urinary retention worse >> stopped Midodrine and increased Florinef to 0.3 mg daily. During this admission a cosyntropin stimulation test was negative. He was started on Hydrocortisone pending further workup.  Upon questioning, a diagnosis of POTS was also entertained by the admitting team while in the hospital.   He will have an appointment with neurology at the end of the month, and will have of prior brain MRI.  Pt. has been found to have a low cortisol level this summer (pt also had a positive UDS on 12/04/2014 >> he was on opiates then): Component     Latest Ref Rng 12/08/2014 (2:52 pm) 12/10/2014 (5:16 AM)   Cortisol - AM     6.7 - 22.6 ug/dL 5.0 (L)  4.5 (L)   He then had a cosyntropin stimulation test while in the hospital >> normal: Component     Latest Ref Rng 12/18/2014 (7:30 AM)   Cortisol, Base      4.4  Cortisol, 30 Min      17.6  Cortisol, 60 Min      23.4   He is now on Hydrocortisone: 10 mg in am, 5 mg in pm, 5 mg at bedtime. He is also on Florinef 0.3 mg daily in am - as he had dizziness and orthostatic hypotension. No more "passing out" after increasing the dose of Florinef. Also, no hypertension on this dose.  He is telling me that he did not feel a big difference in how he was feeling  before or after hydrocortisone.  He has severe back pain (spinal stenosis and degenerative disk ds) - disability since 2013 - 2 surgeries - steroid inj before last surgery in 2009. He is on  On Morphine IR.  No h/o Megace, po ketoconazole, phenytoin, rifampin, chronic fluconazole use. + h/o autoimmune diseases in family mbs: DM1 in great grandparents (?) + excess use of NSAIDs - for his back No h/o generalized infections or HIV. No IVDA. No h/o head injury or severe HA. No h/o malignancy.  Pt mentions: - + weight loss: 250 >> 190 lbs over last 9 months (decreased appetite) - + fatigue - + nausea - occasionally - once a week - + vomiting - ocasionally - + abdominal pain - + mm and joint  aches - + palpitations - no HAs - + dizziness - + syncopal episodes: lost consciousness for a short period of time - before increasing Florinef to 0.3 mg daily  + h/o hyponatremia, but no hyperkalemia.   Chemistry      Component Value Date/Time   NA 133* 01/17/2015 1014   K 3.8 01/17/2015 1014   CL 103 01/17/2015 1014   CO2 20 01/17/2015 1014   BUN 14 01/17/2015 1014   CREATININE 1.07 01/17/2015 1014  CREATININE 0.82 12/22/2014 1308      Component Value Date/Time   CALCIUM 9.2 01/17/2015 1014   ALKPHOS 50 12/18/2014 0730   AST 20 12/18/2014 0730   ALT 23 12/18/2014 0730   BILITOT 0.4 12/18/2014 0730      I reviewed his previous sodium level, and they were low along with a urine that was not maximally diluted: Component     Latest Ref Rng 10/21/2014 11/29/2014 11/30/2014 12/02/2014  Sodium     135 - 145 mmol/L 134 (L) 124 (L) 127 (L) 134 (L)  Potassium     3.5 - 5.1 mmol/L 3.3 (L) 3.4 (L) 3.9 3.7  Osmolality, Ur     390 - 1090 mOsm/kg   140 (L)    A TSH was normal: Lab Results  Component Value Date   TSH 1.145 12/04/2014   I reviewed his chart and he also has a history of cluster HAs (resolved). He also has an elevated hemoglobin A1c, 6.5%. He has GERD and  anemia.  ROS: Constitutional: + see HPI + nocturia, + cold intolerance Eyes: + blurry vision, no xerophthalmia ENT: no sore throat, no nodules palpated in throat, + dysphagia/no odynophagia, no hoarseness, + tinnitus, + hypoacusis Cardiovascular: no CP/+ SOB/+ palpitations/no leg swelling Respiratory: no cough/+ SOB Gastrointestinal: + N/no V/D/+ C, + heartburn Musculoskeletal: + muscle aches/+ joint aches Skin: no rashes, + itching Neurological: no tremors/numbness/tingling/+ dizziness, + loss of consciousness Psychiatric: no depression/anxiety + low libido, pbs with erections I reviewed pt's medications, allergies, PMH, social hx, family hx, and changes were documented in the history of present illness. Otherwise, unchanged from my initial visit note.  Past Medical History  Diagnosis Date  . Back pain   . DDD (degenerative disc disease), cervical   . Spinal stenosis   . Headache     migraines   Past Surgical History  Procedure Laterality Date  . Back surgery  2002,2009    x 2, lower , job injury, spinal stenosis   Social History   Social History  . Marital Status: Married    Spouse Name: N/A  . Number of Children: 4  . Years of Education: 12   Occupational History  .      disabled   Social History Main Topics  . Smoking status: Former Smoker    Quit date: 05/02/2007  . Smokeless tobacco: Never Used  . Alcohol Use: No  . Drug Use: No  . Sexual Activity: Yes   Other Topics Concern  . Not on file   Social History Narrative   Lives at home with wife, children   Caffeine use- 1 soda a day   Current Outpatient Prescriptions on File Prior to Visit  Medication Sig Dispense Refill  . fludrocortisone (FLORINEF) 0.1 MG tablet Take 3 tablets (0.3 mg total) by mouth daily. 90 tablet 11  . gabapentin (NEURONTIN) 300 MG capsule Take 1 capsule (300 mg total) by mouth 3 (three) times daily. 30 capsule 3  . hydrocortisone (CORTEF) 5 MG tablet Take 2 tablets (10mg ) daily at  7am, take 5mg  daily with lunch, and take 5mg  daily at 4pm 120 tablet 3  . meloxicam (MOBIC) 7.5 MG tablet Take 1 tablet (7.5 mg total) by mouth daily. 30 tablet 3  . morphine (MSIR) 15 MG tablet Take 1 tablet (15 mg total) by mouth every 6 (six) hours as needed for severe pain. 60 tablet 0  . sulfamethoxazole-trimethoprim (BACTRIM DS,SEPTRA DS) 800-160 MG per tablet Take 1 tablet by  mouth 2 (two) times daily. (Patient taking differently: Take 1 tablet by mouth 2 (two) times daily. 42 day course started 12/03/14 pm) 84 tablet 0  . venlafaxine (EFFEXOR) 25 MG tablet Take 1 tablet (25 mg total) by mouth 2 (two) times daily with a meal. 30 tablet 3  . docusate sodium (COLACE) 250 MG capsule Take 1 capsule (250 mg total) by mouth daily. (Patient not taking: Reported on 01/27/2015) 10 capsule 0  . ondansetron (ZOFRAN ODT) 4 MG disintegrating tablet Take 1 tablet (4 mg total) by mouth every 8 (eight) hours as needed for nausea or vomiting. (Patient not taking: Reported on 01/11/2015) 20 tablet 0  . polyethylene glycol powder (GLYCOLAX/MIRALAX) powder Take 17 g by mouth 2 (two) times daily as needed. (Patient not taking: Reported on 01/27/2015) 3350 g 1   No current facility-administered medications on file prior to visit.   Allergies  Allergen Reactions  . Antihistamines, Chlorpheniramine-Type Other (See Comments)    Hallucinations  . Other     pyridium  . Phenazopyridine Hives and Other (See Comments)    Pyridium. Dizziness and indigestion   Family History  Problem Relation Age of Onset  . Heart disease Maternal Grandmother   . Cancer Maternal Grandfather   . Hypertension Mother    PE: BP 124/62 mmHg  Pulse 97  Temp(Src) 98.1 F (36.7 C) (Oral)  Resp 14  Ht  (1.803 m)  Wt 182 lb (82.555 kg)  BMI 25.40 kg/m2  SpO2 98% Wt Readings from Last 3 Encounters:  01/27/15 182 lb (82.555 kg)  01/17/15 184 lb (83.462 kg)  01/11/15 190 lb (86.183 kg)   Constitutional: normal weight, in NAD,  lying down on the examiner's table due to back pain  Eyes: PERRLA, EOMI, no exophthalmos ENT: moist mucous membranes, no thyromegaly, no cervical lymphadenopathy Cardiovascular: RRR, No MRG Respiratory: CTA B Gastrointestinal: abdomen soft, NT, ND, BS+ Musculoskeletal: no deformities, strength intact in all 4 Skin: moist, warm, no rashes; no dark discoloration of skin Neurological: + tremor with outstretched hands, DTR 1/5 in all 4  ASSESSMENT: 1. Low cortisol level - ? Adrenal insufficiency  PLAN:  1. Low cortisol level - reviewed with pt and his wife his cortisol levels - they were low, however, they were drawn after starting opiate medication. His adrenal function per se appears to be normal, based on the normal stimulation test. In this case, to scenarios are possible:  A. he does not have adrenal insufficiency  B. he has newly developed central adrenal insufficiency  - it is very difficult to differentiate between the above possibilities.  There are several indicators point towards patient not having adrenal insufficiency:  His cortisol was low, however, this was drawn while patient was on opiates.  His dizziness/orthostatic hypotension did not significantly improve after starting hydrocortisone. If these signs/symptoms were related to adrenal insufficiency, patient with a felt a great improvement from hydrocortisone. There are several indicators that point towards patient having adrenal insufficiency:  Weight loss over the last 9 months  Dizziness/orthostatic hypotension  SIADH-like picture during his hospitalization  History of steroid use  Opiate use - for now, will continue with his hydrocortisone treatment, until we have a clear diagnosis. I explained that we need to repeat a cosyntropin stimulation test.We will plan to check this at 8 am >> will return for this. However, this is not the best test for central adrenal insufficiency, but I do not have the possibility of  performing an insulin tolerance test (  ITT) in the office. He will have a brain MRI this month, and I will need to review the images to see if there is any pituitary tumor present. - I did advise him to decrease his dose of hydrocortisone from 3 times a day to just 2 times a day: 10 mg in am and 5 mg in pm, best ~3 pm. - Regarding the fludrocortisone: This can be used for orthostatic hypotension in the absence of primary adrenal insufficiency. Since I do not think that he has primary adrenal insufficiency, will not adjust the dose, as I will consider it a blood pressure medicine. It is a rather high dose, but he was still symptomatic with orthostatic hypotension at lower doses. No signs of hypertension or headaches. - We may need to check several tests to try to establish the etiology, depending on the results of the cosyntropin stimulation test. - given sick days rules:  If you cannot keep anything down, including your hydrocortisone medication, please go to the emergency room or your primary care physician office to get steroids injected in the muscle or vein. Alternatively, you can inject 100 mg hydrocortisone in the muscle at home.  If you have a fever (more than 100 Fahrenheit) or gastroenteritis with nausea/vomiting and diarrhea, please double the dose of your hydrocortisone for the duration of the fever or the gastroenteritis.  Do not run out of your hydrocortisone medication. - advised for MedAlert bracelet mentioning "adrenal insufficiency". - I will see the patient back in 6 months, will keep in touch with him until then  - time spent with the patient: 1 hour, of which >50% was spent in obtaining information about his symptoms, reviewing his previous labs, evaluations, and treatments, counseling him about his condition (please see the discussed topics above), and developing a plan to further investigate it; he had a number of questions which I addressed.

## 2015-01-27 NOTE — Patient Instructions (Signed)
Please decrease the Hydrocortisone to 10 mg in am and 5 mg in pm, no later than 6 pm.  Continue Fludrocortisone (Florinef) 0.3 mg daily.  Please come back at 8 am, for a stimulation test. Plan to be here for 1 hour for this.  Do not take your hydrocortisone the afternoon preceding the test or the morning of the test. Take morning dose right after labs.   If you cannot keep anything down, including your hydrocortisone medication, please go to the emergency room or your primary care physician office to get steroids injected in the muscle or vein. Alternatively, you can inject 100 mg hydrocortisone in the muscle at home.  If you have a fever (more than 100 Fahrenheit) or gastroenteritis with nausea/vomiting and diarrhea, please double the dose of your hydrocortisone for the duration of the fever or the gastroenteritis.  Do not run out of your hydrocortisone medication.  Please come back for a follow-up appointment in 6 months.

## 2015-01-29 ENCOUNTER — Emergency Department (HOSPITAL_COMMUNITY)
Admission: EM | Admit: 2015-01-29 | Discharge: 2015-01-29 | Disposition: A | Discharge status: Home / Self Care | Payer: Medicare Other | Attending: Emergency Medicine | Admitting: Emergency Medicine

## 2015-01-29 ENCOUNTER — Telehealth: Payer: Self-pay | Admitting: Internal Medicine

## 2015-01-29 ENCOUNTER — Other Ambulatory Visit: Payer: Self-pay | Admitting: *Deleted

## 2015-01-29 ENCOUNTER — Other Ambulatory Visit: Payer: Medicare Other

## 2015-01-29 ENCOUNTER — Emergency Department (HOSPITAL_COMMUNITY): Payer: Medicare Other

## 2015-01-29 ENCOUNTER — Encounter (HOSPITAL_COMMUNITY): Payer: Self-pay | Admitting: Emergency Medicine

## 2015-01-29 DIAGNOSIS — Z87891 Personal history of nicotine dependence: Secondary | ICD-10-CM | POA: Insufficient documentation

## 2015-01-29 DIAGNOSIS — Z79899 Other long term (current) drug therapy: Secondary | ICD-10-CM | POA: Diagnosis not present

## 2015-01-29 DIAGNOSIS — G43909 Migraine, unspecified, not intractable, without status migrainosus: Secondary | ICD-10-CM | POA: Diagnosis not present

## 2015-01-29 DIAGNOSIS — G8929 Other chronic pain: Secondary | ICD-10-CM | POA: Insufficient documentation

## 2015-01-29 DIAGNOSIS — Z791 Long term (current) use of non-steroidal anti-inflammatories (NSAID): Secondary | ICD-10-CM | POA: Insufficient documentation

## 2015-01-29 DIAGNOSIS — Z23 Encounter for immunization: Secondary | ICD-10-CM

## 2015-01-29 DIAGNOSIS — R630 Anorexia: Secondary | ICD-10-CM | POA: Insufficient documentation

## 2015-01-29 DIAGNOSIS — R404 Transient alteration of awareness: Secondary | ICD-10-CM | POA: Diagnosis not present

## 2015-01-29 DIAGNOSIS — R251 Tremor, unspecified: Secondary | ICD-10-CM | POA: Diagnosis not present

## 2015-01-29 DIAGNOSIS — R109 Unspecified abdominal pain: Secondary | ICD-10-CM | POA: Insufficient documentation

## 2015-01-29 DIAGNOSIS — R531 Weakness: Secondary | ICD-10-CM | POA: Diagnosis not present

## 2015-01-29 DIAGNOSIS — R112 Nausea with vomiting, unspecified: Secondary | ICD-10-CM | POA: Diagnosis present

## 2015-01-29 DIAGNOSIS — R1084 Generalized abdominal pain: Secondary | ICD-10-CM | POA: Diagnosis not present

## 2015-01-29 DIAGNOSIS — Z8739 Personal history of other diseases of the musculoskeletal system and connective tissue: Secondary | ICD-10-CM | POA: Insufficient documentation

## 2015-01-29 LAB — CBC WITH DIFFERENTIAL/PLATELET
Basophils Absolute: 0 10*3/uL (ref 0.0–0.1)
Basophils Relative: 0 %
Eosinophils Absolute: 0 10*3/uL (ref 0.0–0.7)
Eosinophils Relative: 1 %
HCT: 37.7 % — ABNORMAL LOW (ref 39.0–52.0)
Hemoglobin: 13.1 g/dL (ref 13.0–17.0)
Lymphocytes Relative: 19 %
Lymphs Abs: 0.6 10*3/uL — ABNORMAL LOW (ref 0.7–4.0)
MCH: 32.8 pg (ref 26.0–34.0)
MCHC: 34.7 g/dL (ref 30.0–36.0)
MCV: 94.3 fL (ref 78.0–100.0)
Monocytes Absolute: 0.3 10*3/uL (ref 0.1–1.0)
Monocytes Relative: 8 %
Neutro Abs: 2.3 10*3/uL (ref 1.7–7.7)
Neutrophils Relative %: 72 %
Platelets: 206 10*3/uL (ref 150–400)
RBC: 4 MIL/uL — ABNORMAL LOW (ref 4.22–5.81)
RDW: 12 % (ref 11.5–15.5)
WBC: 3.3 10*3/uL — ABNORMAL LOW (ref 4.0–10.5)

## 2015-01-29 LAB — COMPREHENSIVE METABOLIC PANEL
ALT: 17 U/L (ref 17–63)
AST: 19 U/L (ref 15–41)
Albumin: 4.1 g/dL (ref 3.5–5.0)
Alkaline Phosphatase: 76 U/L (ref 38–126)
Anion gap: 11 (ref 5–15)
BUN: 6 mg/dL (ref 6–20)
CO2: 23 mmol/L (ref 22–32)
Calcium: 9.7 mg/dL (ref 8.9–10.3)
Chloride: 101 mmol/L (ref 101–111)
Creatinine, Ser: 0.92 mg/dL (ref 0.61–1.24)
GFR calc Af Amer: >60 mL/min (ref >60)
GFR calc non Af Amer: >60 mL/min (ref >60)
Glucose, Bld: 114 mg/dL — ABNORMAL HIGH (ref 65–99)
Potassium: 3.4 mmol/L — ABNORMAL LOW (ref 3.5–5.1)
Sodium: 135 mmol/L (ref 135–145)
Total Bilirubin: 0.9 mg/dL (ref 0.3–1.2)
Total Protein: 7.8 g/dL (ref 6.5–8.1)

## 2015-01-29 LAB — URINALYSIS, ROUTINE W REFLEX MICROSCOPIC
Bilirubin Urine: NEGATIVE
Glucose, UA: NEGATIVE mg/dL
Hgb urine dipstick: NEGATIVE
Ketones, ur: 15 mg/dL — AB
Leukocytes, UA: NEGATIVE
Nitrite: NEGATIVE
Protein, ur: NEGATIVE mg/dL
Specific Gravity, Urine: 1.01 (ref 1.005–1.030)
Urobilinogen, UA: 1 mg/dL (ref 0.0–1.0)
pH: 6.5 (ref 5.0–8.0)

## 2015-01-29 MED ORDER — IOHEXOL 300 MG/ML  SOLN: 25.0000 mL | INTRAMUSCULAR | Status: DC

## 2015-01-29 MED ORDER — PROCHLORPERAZINE EDISYLATE 5 MG/ML IJ SOLN
10.0000 mg | Freq: Once | INTRAMUSCULAR | Status: AC
  Administered 2015-01-29: 10 mg via INTRAVENOUS
  Filled 2015-01-29: qty 2

## 2015-01-29 MED ORDER — MORPHINE SULFATE (PF) 4 MG/ML IV SOLN
6.0000 mg | Freq: Once | INTRAVENOUS | Status: AC
  Administered 2015-01-29: 6 mg via INTRAVENOUS
  Filled 2015-01-29: qty 2

## 2015-01-29 MED ORDER — PROMETHAZINE HCL 25 MG PO TABS: 25.0000 mg | ORAL_TABLET | Freq: Four times a day (QID) | ORAL | Status: DC | PRN

## 2015-01-29 MED ORDER — LORAZEPAM 2 MG/ML IJ SOLN
1.0000 mg | Freq: Once | INTRAMUSCULAR | Status: AC
  Administered 2015-01-29: 1 mg via INTRAVENOUS
  Filled 2015-01-29: qty 1

## 2015-01-29 MED ORDER — SODIUM CHLORIDE 0.9 % IV BOLUS (SEPSIS)
1000.0000 mL | Freq: Once | INTRAVENOUS | Status: AC
  Administered 2015-01-29: 1000 mL via INTRAVENOUS

## 2015-01-29 MED ORDER — HYDROCORTISONE NA SUCCINATE PF 100 MG IJ SOLR: 200.0000 mg | Freq: Once | INTRAMUSCULAR | Status: DC

## 2015-01-29 MED ORDER — PROMETHAZINE HCL 25 MG RE SUPP: 25.0000 mg | Freq: Four times a day (QID) | RECTAL | Status: DC | PRN

## 2015-01-29 MED ORDER — HYDROCORTISONE NA SUCCINATE PF 100 MG IJ SOLR
200.0000 mg | Freq: Once | INTRAMUSCULAR | Status: AC
  Administered 2015-01-29: 200 mg via INTRAVENOUS
  Filled 2015-01-29: qty 4

## 2015-01-29 MED ORDER — FLUDROCORTISONE ACETATE 0.1 MG PO TABS
0.3000 mg | ORAL_TABLET | Freq: Every day | ORAL | Status: DC
  Administered 2015-01-29: 0.3 mg via ORAL
  Filled 2015-01-29 (×2): qty 3

## 2015-01-29 MED ORDER — ONDANSETRON HCL 4 MG/2ML IJ SOLN
4.0000 mg | Freq: Once | INTRAMUSCULAR | Status: AC
  Administered 2015-01-29: 4 mg via INTRAVENOUS
  Filled 2015-01-29: qty 2

## 2015-01-29 MED ORDER — IOHEXOL 300 MG/ML  SOLN
100.0000 mL | Freq: Once | INTRAMUSCULAR | Status: AC | PRN
  Administered 2015-01-29: 100 mL via INTRAVENOUS

## 2015-01-29 MED ORDER — IOHEXOL 300 MG/ML  SOLN
25.0000 mL | INTRAMUSCULAR | Status: AC
  Administered 2015-01-29: 25 mL via ORAL

## 2015-01-29 NOTE — ED Notes (Signed)
Pt had an episode of vomiting. MD notified

## 2015-01-29 NOTE — Telephone Encounter (Signed)
Dr Elvera Lennox spoke with nurse in ED.

## 2015-01-29 NOTE — ED Notes (Addendum)
Per EMS, pt coming in for nausea and vomiting since last night. Upon EMS arrival pt was lying on the couch with mild tremors to his arms. Pt had taken zofran prior to EMS arrival which appears to have helped his vomiting pt still reports nausea. Pt alert x4. NAD at this time. Pt also reporting that he feels like his thoughts are racing. Pt also reports generalized abd pain. CBG: 96

## 2015-01-29 NOTE — ED Notes (Addendum)
Encouraged pt to slowly try and sip on some fluids. Family at bedside is also encouraging fluid intake

## 2015-01-29 NOTE — Telephone Encounter (Signed)
ER from [facility] called and want consult on patient. After hours nurse called didn;t give a number

## 2015-01-29 NOTE — ED Notes (Signed)
MD at the bedside  

## 2015-01-29 NOTE — Telephone Encounter (Signed)
Please read message below and advise.  

## 2015-01-29 NOTE — ED Provider Notes (Signed)
CSN: [id number]     Arrival date & time 01/29/15  0708 History   First MD Initiated Contact with Patient 01/29/15 0715     Chief Complaint  Patient presents with  . Emesis  . Abdominal Pain     (Consider location/radiation/quality/duration/timing/severity/associated sxs/prior Treatment) Patient is a 49 y.o. male presenting with vomiting. The history is provided by the patient and the spouse.  Emesis Severity:  Moderate Duration:  12 hours Quality:  Stomach contents Progression:  Unchanged Chronicity:  New Relieved by:  Antiemetics (zofran) Associated symptoms: abdominal pain   Associated symptoms: no chills and no myalgias     Past Medical History  Diagnosis Date  . Back pain   . DDD (degenerative disc disease), cervical   . Spinal stenosis   . Headache     migraines   Past Surgical History  Procedure Laterality Date  . Back surgery  2002,2009    x 2, lower , job injury, spinal stenosis   Family History  Problem Relation Age of Onset  . Heart disease Maternal Grandmother   . Cancer Maternal Grandfather   . Hypertension Mother    Social History  Substance Use Topics  . Smoking status: Former Smoker    Quit date: 05/02/2007  . Smokeless tobacco: Never Used  . Alcohol Use: No    Review of Systems  Constitutional: Positive for appetite change. Negative for fever, chills and fatigue.  Cardiovascular: Negative for chest pain.  Gastrointestinal: Positive for nausea, vomiting and abdominal pain.  Musculoskeletal: Positive for back pain (chronic). Negative for myalgias and neck pain.  Neurological: Positive for tremors.  All other systems reviewed and are negative.     Allergies  Antihistamines, chlorpheniramine-type; Other; and Phenazopyridine  Home Medications   Prior to Admission medications   Medication Sig Start Date End Date Taking? Authorizing Provider  docusate sodium (COLACE) 100 MG capsule Take 200 mg by mouth daily.    Historical Provider, MD   fludrocortisone (FLORINEF) 0.1 MG tablet Take 3 tablets (0.3 mg total) by mouth daily. 01/17/15   Collene Gobble, MD  gabapentin (NEURONTIN) 300 MG capsule Take 1 capsule (300 mg total) by mouth 3 (three) times daily. 12/13/14   Bonney Aid, MD  hydrocortisone (CORTEF) 5 MG tablet Take 2 tablets ( ) daily at 7am, take  daily with lunch, and take  daily at 4pm 01/17/15   Collene Gobble, MD  ibuprofen (ADVIL,MOTRIN) 800 MG tablet Take 800 mg by mouth every 8 (eight) hours as needed.    Historical Provider, MD  meloxicam (MOBIC) 7.5 MG tablet Take 1 tablet (7.5 mg total) by mouth daily. 12/13/14   Bonney Aid, MD  morphine (MSIR) 15 MG tablet Take 1 tablet (15 mg total) by mouth every 6 (six) hours as needed for severe pain. 01/17/15   Collene Gobble, MD  promethazine (PHENERGAN) 25 MG suppository Place 1 suppository (25 mg total) rectally every 6 (six) hours as needed for nausea or vomiting. 01/29/15   Marily Memos, MD  promethazine (PHENERGAN) 25 MG tablet Take 1 tablet (25 mg total) by mouth every 6 (six) hours as needed for nausea or vomiting. 01/29/15   Marily Memos, MD  venlafaxine (EFFEXOR) 25 MG tablet Take 1 tablet (25 mg total) by mouth 2 (two) times daily with a meal. 12/13/14   Alyssa A Haney, MD   BP 141/78 mmHg  Pulse 100  Temp(Src) 98.2 F (36.8 C) (Oral)  Resp 18  SpO2 98% Physical Exam  Constitutional: He is oriented to person, place, and time. He appears well-developed and well-nourished.  HENT:  Head: Normocephalic and atraumatic.  Eyes: Pupils are equal, round, and reactive to light.  Neck: Normal range of motion.  Cardiovascular: Normal rate and regular rhythm.   Pulmonary/Chest: Effort normal and breath sounds normal. He has no wheezes. He has no rales.  Abdominal: Soft. There is tenderness.  Musculoskeletal: Normal range of motion. He exhibits no edema or tenderness.  Neurological: He is alert and oriented to person, place, and time.  Intermittently starts shaking  his hand. Is not pill rolling, is not an essential tremor. Sometimes in one hand, both hands and/or feet  Skin: Skin is warm and dry. No erythema.  Nursing note and vitals reviewed.   ED Course  Procedures (including critical care time) Labs Review Labs Reviewed  CBC WITH DIFFERENTIAL/PLATELET - Abnormal; Notable for the following:    WBC 3.3 (*)    RBC 4.00 (*)    HCT 37.7 (*)    Lymphs Abs 0.6 (*)    All other components within normal limits  COMPREHENSIVE METABOLIC PANEL - Abnormal; Notable for the following:    Potassium 3.4 (*)    Glucose, Bld 114 (*)    All other components within normal limits  URINALYSIS, ROUTINE W REFLEX MICROSCOPIC (NOT AT [facility]) - Abnormal; Notable for the following:    Ketones, ur 15 (*)    All other components within normal limits    Imaging Review Ct Abdomen Pelvis W Contrast  01/29/2015   CLINICAL DATA:  Generalized abdominal pain with nausea and vomiting since yesterday.  EXAM: CT ABDOMEN AND PELVIS WITH CONTRAST  TECHNIQUE: Multidetector CT imaging of the abdomen and pelvis was performed using the standard protocol following bolus administration of intravenous contrast.  CONTRAST:  OMNIPAQUE IOHEXOL 300 MG/ML  SOLN  COMPARISON:  12/02/2014  FINDINGS: Lower chest:  No acute findings.  Hepatobiliary: No masses or other significant abnormality. Gallbladder is unremarkable.  Pancreas: No mass, inflammatory changes, or other significant abnormality.  Spleen: Within normal limits in size and appearance.  Adrenals/Urinary Tract: No masses identified. No evidence of hydronephrosis.  Stomach/Bowel: No evidence of obstruction, inflammatory process, or abnormal fluid collections.  Vascular/Lymphatic: No pathologically enlarged lymph nodes. No evidence of abdominal aortic aneurysm.  Reproductive: No mass or other significant abnormality.  Other: Small periumbilical ventral hernia again seen containing only fat. No evidence of herniated bowel loops.   Musculoskeletal:  No suspicious bone lesions identified.  IMPRESSION: No acute findings within the abdomen or pelvis.  Stable small periumbilical hernia containing only fat.   Electronically Signed   By: Myles Rosenthal M.D.   On: 01/29/2015 09:57   I have personally reviewed and evaluated these images and lab results as part of my medical decision-making.   EKG Interpretation None      MDM   Final diagnoses:  Non-intractable vomiting with nausea, vomiting of unspecified type   History of questionable adrenal insufficiency on steroids at home. Has had approximately 12 hours of vomiting and while vomiting he developed some crampy achy abdominal pain. Has had intermittent episodes of random hand movements that seem to be volitional. Vital signs are normal with no hypotension. He is afebrile. Has diffuse abdominal tenderness which I'll get a CT scan to evaluate for any abdominal pathology. Has not taken his medications this morning so was discussed with endocrinology which steroids to give what doses. The meantime we'll check basic labs, give fluids, nausea medicine  and pain medicine. Labs and CT negative. Gave stress dose of hydrocortisone. Nausea under control, pain under control. Observed in ED for a few hours without significant Vital sign changes. D/W endocrinology who did not recommend anything further. Patient tolerating PO, repeat abdominal exam improved. Sable for [employer].   I have personally and contemperaneously reviewed labs and imaging and used in my decision making as above.   A medical screening exam was performed and I feel the patient has had an appropriate workup for their chief complaint at this time and likelihood of emergent condition existing is low. They have been counseled on decision, discharge, follow up and which symptoms necessitate immediate return to the emergency department. They or their family verbally stated understanding and agreement with plan and discharged in stable  condition.      Marily Memos, MD 01/31/15 [phone]

## 2015-01-29 NOTE — ED Notes (Signed)
Pt tolerating PO intake

## 2015-01-29 NOTE — Telephone Encounter (Signed)
What do they mean by wanting a consult on the patient? To be seen by endocrinology in the hospital? If they need this, please advised him to discuss with Dr. Lucianne Muss or Dr. Sharl Ma. His nausea and vomiting are not likely from adrenal insufficiency, as he is on hydrocortisone. however, if he's not able to take his hydrocortisone by mouth, he needs to get IV hydrocortisone. He will need to continue the same dose of hydrocortisone after his can start taking by mouth.

## 2015-02-01 ENCOUNTER — Inpatient Hospital Stay: Admission: RE | Admit: 2015-02-01 | Payer: Medicare Other | Source: Ambulatory Visit

## 2015-02-01 ENCOUNTER — Encounter (HOSPITAL_COMMUNITY): Payer: Self-pay | Admitting: Emergency Medicine

## 2015-02-01 ENCOUNTER — Emergency Department (HOSPITAL_COMMUNITY)
Admission: EM | Admit: 2015-02-01 | Discharge: 2015-02-01 | Disposition: A | Discharge status: Home / Self Care | Payer: Medicare Other | Attending: Emergency Medicine | Admitting: Emergency Medicine

## 2015-02-01 DIAGNOSIS — Z87891 Personal history of nicotine dependence: Secondary | ICD-10-CM | POA: Insufficient documentation

## 2015-02-01 DIAGNOSIS — Z8739 Personal history of other diseases of the musculoskeletal system and connective tissue: Secondary | ICD-10-CM | POA: Insufficient documentation

## 2015-02-01 DIAGNOSIS — R000 Tachycardia, unspecified: Secondary | ICD-10-CM | POA: Diagnosis not present

## 2015-02-01 DIAGNOSIS — R112 Nausea with vomiting, unspecified: Secondary | ICD-10-CM | POA: Insufficient documentation

## 2015-02-01 DIAGNOSIS — R42 Dizziness and giddiness: Secondary | ICD-10-CM | POA: Insufficient documentation

## 2015-02-01 DIAGNOSIS — Z79899 Other long term (current) drug therapy: Secondary | ICD-10-CM | POA: Insufficient documentation

## 2015-02-01 DIAGNOSIS — G43909 Migraine, unspecified, not intractable, without status migrainosus: Secondary | ICD-10-CM | POA: Diagnosis not present

## 2015-02-01 LAB — CBC WITH DIFFERENTIAL/PLATELET
Basophils Absolute: 0 10*3/uL (ref 0.0–0.1)
Basophils Relative: 1 %
Eosinophils Absolute: 0 10*3/uL (ref 0.0–0.7)
Eosinophils Relative: 1 %
HCT: 39.3 % (ref 39.0–52.0)
Hemoglobin: 14.3 g/dL (ref 13.0–17.0)
Lymphocytes Relative: 29 %
Lymphs Abs: 1.4 10*3/uL (ref 0.7–4.0)
MCH: 33.7 pg (ref 26.0–34.0)
MCHC: 36.4 g/dL — ABNORMAL HIGH (ref 30.0–36.0)
MCV: 92.7 fL (ref 78.0–100.0)
Monocytes Absolute: 0.5 10*3/uL (ref 0.1–1.0)
Monocytes Relative: 11 %
Neutro Abs: 2.8 10*3/uL (ref 1.7–7.7)
Neutrophils Relative %: 58 %
Platelets: 233 10*3/uL (ref 150–400)
RBC: 4.24 MIL/uL (ref 4.22–5.81)
RDW: 12 % (ref 11.5–15.5)
WBC: 4.8 10*3/uL (ref 4.0–10.5)

## 2015-02-01 LAB — COMPREHENSIVE METABOLIC PANEL
ALT: 14 U/L — ABNORMAL LOW (ref 17–63)
AST: 20 U/L (ref 15–41)
Albumin: 3.7 g/dL (ref 3.5–5.0)
Alkaline Phosphatase: 74 U/L (ref 38–126)
Anion gap: 13 (ref 5–15)
BUN: 9 mg/dL (ref 6–20)
CO2: 22 mmol/L (ref 22–32)
Calcium: 9.3 mg/dL (ref 8.9–10.3)
Chloride: 96 mmol/L — ABNORMAL LOW (ref 101–111)
Creatinine, Ser: 0.86 mg/dL (ref 0.61–1.24)
GFR calc Af Amer: >60 mL/min (ref >60)
GFR calc non Af Amer: >60 mL/min (ref >60)
Glucose, Bld: 117 mg/dL — ABNORMAL HIGH (ref 65–99)
Potassium: 3.1 mmol/L — ABNORMAL LOW (ref 3.5–5.1)
Sodium: 131 mmol/L — ABNORMAL LOW (ref 135–145)
Total Bilirubin: 1.1 mg/dL (ref 0.3–1.2)
Total Protein: 6.8 g/dL (ref 6.5–8.1)

## 2015-02-01 LAB — I-STAT CG4 LACTIC ACID, ED: Lactic Acid, Venous: 1.89 mmol/L (ref 0.5–2.0)

## 2015-02-01 MED ORDER — SODIUM CHLORIDE 0.9 % IV BOLUS (SEPSIS)
1000.0000 mL | Freq: Once | INTRAVENOUS | Status: AC
  Administered 2015-02-01: 1000 mL via INTRAVENOUS

## 2015-02-01 MED ORDER — POTASSIUM CHLORIDE 10 MEQ/100ML IV SOLN
10.0000 meq | INTRAVENOUS | Status: AC
  Administered 2015-02-01 (×2): 10 meq via INTRAVENOUS
  Filled 2015-02-01 (×2): qty 100

## 2015-02-01 MED ORDER — HYDROCORTISONE NA SUCCINATE PF 100 MG IJ SOLR
200.0000 mg | Freq: Once | INTRAMUSCULAR | Status: AC
  Administered 2015-02-01: 100 mg via INTRAVENOUS
  Filled 2015-02-01: qty 4

## 2015-02-01 MED ORDER — MORPHINE SULFATE (PF) 4 MG/ML IV SOLN
6.0000 mg | Freq: Once | INTRAVENOUS | Status: AC
  Administered 2015-02-01: 6 mg via INTRAVENOUS
  Filled 2015-02-01: qty 2

## 2015-02-01 MED ORDER — POTASSIUM CHLORIDE CRYS ER 20 MEQ PO TBCR: 40.0000 meq | EXTENDED_RELEASE_TABLET | Freq: Once | ORAL | Status: DC

## 2015-02-01 MED ORDER — ONDANSETRON HCL 4 MG/2ML IJ SOLN
4.0000 mg | Freq: Once | INTRAMUSCULAR | Status: AC
  Administered 2015-02-01: 4 mg via INTRAVENOUS
  Filled 2015-02-01: qty 2

## 2015-02-01 MED ORDER — METOCLOPRAMIDE HCL 10 MG PO TABS
10.0000 mg | ORAL_TABLET | Freq: Four times a day (QID) | ORAL | Status: DC
Start: 2015-02-01 — End: 2015-02-03

## 2015-02-01 MED ORDER — OXYCODONE-ACETAMINOPHEN 5-325 MG PO TABS
2.0000 | ORAL_TABLET | Freq: Once | ORAL | Status: AC
  Administered 2015-02-01: 2 via ORAL
  Filled 2015-02-01: qty 2

## 2015-02-01 NOTE — ED Notes (Signed)
Pt unable to tolerate K+ at current rate turned down to 40ml/hr

## 2015-02-01 NOTE — ED Provider Notes (Signed)
CSN: [id number]     Arrival date & time 02/01/15  0600 History   First MD Initiated Contact with Patient 02/01/15 0601     Chief Complaint  Patient presents with  . Emesis  . Dizziness     (Consider location/radiation/quality/duration/timing/severity/associated sxs/prior Treatment) HPI Comments: Patient with history of chronic back pain, questionable adrenal insufficiency, postural orthostatic tachycardia syndrome  -- presents with worsening of N/V that he has been having for the past several days. Was seen in ED 3 days ago and had labs, CT scan of abdomen. Was treated and released. He did receive dose of IV steroids in ED. Patient felt better with treatment and was discharged to home. Two days ago the patient felt improved, but began vomiting again yesterday morning. Patient endorses lightheadedness with standing. He has been unable to keep down any of his medications since 2 days ago including chronic pain medications. No chest pain or abdominal pain. He also states that when he feels bad like this, he develops tremors which have been increased. The onset of this condition was acute. The course is constant. Aggravating factors: none. Alleviating factors: none.    Patient is a 49 y.o. male presenting with vomiting and dizziness. The history is provided by the patient and medical records.  Emesis Associated symptoms: no abdominal pain, no diarrhea, no headaches, no myalgias and no sore throat   Dizziness Associated symptoms: nausea and vomiting   Associated symptoms: no chest pain, no diarrhea and no headaches     Past Medical History  Diagnosis Date  . Back pain   . DDD (degenerative disc disease), cervical   . Spinal stenosis   . Headache     migraines   Past Surgical History  Procedure Laterality Date  . Back surgery  2002,2009    x 2, lower , job injury, spinal stenosis   Family History  Problem Relation Age of Onset  . Heart disease Maternal Grandmother   . Cancer Maternal  Grandfather   . Hypertension Mother    Social History  Substance Use Topics  . Smoking status: Former Smoker    Quit date: 05/02/2007  . Smokeless tobacco: Never Used  . Alcohol Use: No    Review of Systems  Constitutional: Negative for fever.  HENT: Negative for rhinorrhea and sore throat.   Eyes: Negative for redness.  Respiratory: Negative for cough.   Cardiovascular: Negative for chest pain.  Gastrointestinal: Positive for nausea and vomiting. Negative for abdominal pain, diarrhea and constipation.  Genitourinary: Negative for dysuria.  Musculoskeletal: Positive for back pain (Chronic). Negative for myalgias.  Skin: Negative for rash.  Neurological: Positive for dizziness, tremors and light-headedness. Negative for headaches.      Allergies  Antihistamines, chlorpheniramine-type; Other; and Phenazopyridine  Home Medications   Prior to Admission medications   Medication Sig Start Date End Date Taking? Authorizing Provider  docusate sodium (COLACE) 100 MG capsule Take 200 mg by mouth daily.    Historical Provider, MD  fludrocortisone (FLORINEF) 0.1 MG tablet Take 3 tablets (0.3 mg total) by mouth daily. 01/17/15   Collene Gobble, MD  gabapentin (NEURONTIN) 300 MG capsule Take 1 capsule (300 mg total) by mouth 3 (three) times daily. 12/13/14   Bonney Aid, MD  hydrocortisone (CORTEF) 5 MG tablet Take 2 tablets ( ) daily at 7am, take  daily with lunch, and take  daily at 4pm 01/17/15   Collene Gobble, MD  ibuprofen (ADVIL,MOTRIN) 800 MG tablet Take 800 mg by mouth  every 8 (eight) hours as needed.    Historical Provider, MD  meloxicam (MOBIC) 7.5 MG tablet Take 1 tablet (7.5 mg total) by mouth daily. 12/13/14   Bonney Aid, MD  morphine (MSIR) 15 MG tablet Take 1 tablet (15 mg total) by mouth every 6 (six) hours as needed for severe pain. 01/17/15   Collene Gobble, MD  promethazine (PHENERGAN) 25 MG suppository Place 1 suppository (25 mg total) rectally every 6 (six)  hours as needed for nausea or vomiting. 01/29/15   Marily Memos, MD  promethazine (PHENERGAN) 25 MG tablet Take 1 tablet (25 mg total) by mouth every 6 (six) hours as needed for nausea or vomiting. 01/29/15   Marily Memos, MD  venlafaxine (EFFEXOR) 25 MG tablet Take 1 tablet (25 mg total) by mouth 2 (two) times daily with a meal. 12/13/14   Alyssa A Haney, MD   BP 156/89 mmHg  Pulse 108  Temp(Src) 97.9 F (36.6 C) (Oral)  Resp 20  SpO2 100%   Physical Exam  Constitutional: He appears well-developed and well-nourished.  HENT:  Head: Normocephalic and atraumatic.  Nose: Nose normal.  Mouth/Throat: Oropharynx is clear and moist.  Eyes: Conjunctivae are normal. Right eye exhibits no discharge. Left eye exhibits no discharge.  Neck: Normal range of motion. Neck supple.  Cardiovascular: Regular rhythm and normal heart sounds.  Tachycardia present.   Pulmonary/Chest: Effort normal and breath sounds normal. No respiratory distress. He has no wheezes. He has no rales.  Abdominal: Soft. There is no tenderness. There is no rebound and no guarding.  Musculoskeletal: He exhibits no edema or tenderness.  Neurological: He is alert.  Skin: Skin is warm and dry.  Psychiatric: He has a normal mood and affect.  Nursing note and vitals reviewed.   ED Course  Procedures (including critical care time) Labs Review Labs Reviewed  CBC WITH DIFFERENTIAL/PLATELET - Abnormal; Notable for the following:    MCHC 36.4 (*)    All other components within normal limits  COMPREHENSIVE METABOLIC PANEL - Abnormal; Notable for the following:    Sodium 131 (*)    Potassium 3.1 (*)    Chloride 96 (*)    Glucose, Bld 117 (*)    ALT 14 (*)    All other components within normal limits  I-STAT CG4 LACTIC ACID, ED    Imaging Review No results found. I have personally reviewed and evaluated these images and lab results as part of my medical decision-making.   EKG Interpretation None       6:24 AM Patient seen  and examined. Work-up initiated. Medications ordered.   Vital signs reviewed and are as follows: BP 156/89 mmHg  Pulse 108  Temp(Src) 97.9 F (36.6 C) (Oral)  Resp 20  SpO2 100%  8:32 AM Patient discussed with Dr. Jeraldine Loots. Labs reviewed. Patient rechecked. Treated hypokalemia. He is stable. Will monitor for improvement.   1:18 PM Patient hydrated and monitored for several hours. After fluids, he has been up in hallway to restroom x 2 without difficulty. He becomes tachycardic with standing but is otherwise asymptomatic. No CP, SOB. Discussed results with patient. He is requesting additional pain medication. States he is worried about vomiting, but has had sips of water in room and has had no vomiting since arriving in the ED 8 hours ago. Abd exam is benign. Feel that he can be discharged to home, zofran and PO percocet prior to discharge. He states that zofran and phenergan have not been helping. Reglan  rx given.   Encouraged PCP f/u in 2-3 days for recheck.  The patient was urged to return to the Emergency Department immediately with worsening of current symptoms, worsening abdominal pain, persistent vomiting, blood noted in stools, fever, or any other concerns. The patient verbalized understanding.    MDM   Final diagnoses:  Non-intractable vomiting with nausea, vomiting of unspecified type   Patient with h/o POTS, possible adrenal insufficiency presents with c/o N/V, lightheadedness with standing. Recent neg CT. Labs repeated today without renal dysfunction. Slightly low potassium, sodium, repleated. Hydrated 2L. Normal lactate. No vomiting here. Ambulatory without difficultly. No acute indications for admission. He has f/u. Treatment for home as above.      Renne Crigler, PA-C 02/01/15 1330  Geoffery Lyons, MD 02/02/15 2052

## 2015-02-01 NOTE — ED Notes (Signed)
Pt ambulated to bathroom without difficulty.

## 2015-02-01 NOTE — Discharge Instructions (Signed)
Please read and follow all provided instructions.  Your diagnoses today include:  1. Non-intractable vomiting with nausea, vomiting of unspecified type    Tests performed today include:  Blood counts and electrolytes  Vital signs. See below for your results today.   Medications prescribed:   Reglan - for nausea and vomiting  Take any prescribed medications only as directed.  Home care instructions:  Follow any educational materials contained in this packet.  BE VERY CAREFUL not to take multiple medicines containing Tylenol (also called acetaminophen). Doing so can lead to an overdose which can damage your liver and cause liver failure and possibly death.   Follow-up instructions: Please follow-up with your primary care provider in the next 3 days for further evaluation of your symptoms.   Return instructions:   Please return to the Emergency Department if you experience worsening symptoms.   Please return if you have any other emergent concerns.  Additional Information:  Your vital signs today were: BP 152/82 mmHg   Pulse 102   Temp(Src) 97.9 F (36.6 C) (Oral)   Resp 22   SpO2 98% If your blood pressure (BP) was elevated above 135/85 this visit, please have this repeated by your doctor within one month. --------------

## 2015-02-01 NOTE — ED Notes (Signed)
Patient with nausea, vomiting and dizziness for the last few weeks.  Patient was seen here two days ago for the same.  Patient having a hard time keeping food or drink down.  Anytime he moves side to side he gets even more nauseated.  Patient is unable to get rid of the dizziness.

## 2015-02-02 ENCOUNTER — Emergency Department (HOSPITAL_COMMUNITY)
Admission: EM | Admit: 2015-02-02 | Discharge: 2015-02-02 | Disposition: A | Discharge status: Home / Self Care | Payer: Medicare Other | Attending: Emergency Medicine | Admitting: Emergency Medicine

## 2015-02-02 ENCOUNTER — Encounter (HOSPITAL_COMMUNITY): Payer: Self-pay | Admitting: Emergency Medicine

## 2015-02-02 ENCOUNTER — Emergency Department (HOSPITAL_COMMUNITY): Payer: Medicare Other

## 2015-02-02 ENCOUNTER — Encounter: Payer: Self-pay | Admitting: Emergency Medicine

## 2015-02-02 DIAGNOSIS — R531 Weakness: Secondary | ICD-10-CM | POA: Diagnosis not present

## 2015-02-02 DIAGNOSIS — M791 Myalgia: Secondary | ICD-10-CM | POA: Insufficient documentation

## 2015-02-02 DIAGNOSIS — Z79899 Other long term (current) drug therapy: Secondary | ICD-10-CM | POA: Insufficient documentation

## 2015-02-02 DIAGNOSIS — I10 Essential (primary) hypertension: Secondary | ICD-10-CM | POA: Diagnosis not present

## 2015-02-02 DIAGNOSIS — R55 Syncope and collapse: Secondary | ICD-10-CM | POA: Diagnosis present

## 2015-02-02 DIAGNOSIS — Z87891 Personal history of nicotine dependence: Secondary | ICD-10-CM | POA: Diagnosis not present

## 2015-02-02 DIAGNOSIS — R112 Nausea with vomiting, unspecified: Secondary | ICD-10-CM | POA: Diagnosis not present

## 2015-02-02 LAB — BASIC METABOLIC PANEL
Anion gap: 11 (ref 5–15)
BUN: 7 mg/dL (ref 6–20)
CO2: 24 mmol/L (ref 22–32)
Calcium: 9.3 mg/dL (ref 8.9–10.3)
Chloride: 98 mmol/L — ABNORMAL LOW (ref 101–111)
Creatinine, Ser: 0.89 mg/dL (ref 0.61–1.24)
GFR calc Af Amer: >60 mL/min (ref >60)
GFR calc non Af Amer: >60 mL/min (ref >60)
Glucose, Bld: 133 mg/dL — ABNORMAL HIGH (ref 65–99)
Potassium: 3 mmol/L — ABNORMAL LOW (ref 3.5–5.1)
Sodium: 133 mmol/L — ABNORMAL LOW (ref 135–145)

## 2015-02-02 LAB — I-STAT TROPONIN, ED: Troponin i, poc: 0 ng/mL (ref 0.00–0.08)

## 2015-02-02 LAB — CBC
HCT: 34.7 % — ABNORMAL LOW (ref 39.0–52.0)
Hemoglobin: 12.2 g/dL — ABNORMAL LOW (ref 13.0–17.0)
MCH: 33 pg (ref 26.0–34.0)
MCHC: 35.2 g/dL (ref 30.0–36.0)
MCV: 93.8 fL (ref 78.0–100.0)
Platelets: 194 10*3/uL (ref 150–400)
RBC: 3.7 MIL/uL — ABNORMAL LOW (ref 4.22–5.81)
RDW: 12 % (ref 11.5–15.5)
WBC: 4 10*3/uL (ref 4.0–10.5)

## 2015-02-02 LAB — URINALYSIS, ROUTINE W REFLEX MICROSCOPIC
Bilirubin Urine: NEGATIVE
Glucose, UA: NEGATIVE mg/dL
Hgb urine dipstick: NEGATIVE
Ketones, ur: NEGATIVE mg/dL
Leukocytes, UA: NEGATIVE
Nitrite: NEGATIVE
Protein, ur: NEGATIVE mg/dL
Specific Gravity, Urine: 1.013 (ref 1.005–1.030)
Urobilinogen, UA: 1 mg/dL (ref 0.0–1.0)
pH: 7 (ref 5.0–8.0)

## 2015-02-02 LAB — CBG MONITORING, ED: Glucose-Capillary: 131 mg/dL — ABNORMAL HIGH (ref 65–99)

## 2015-02-02 MED ORDER — SODIUM CHLORIDE 0.9 % IV BOLUS (SEPSIS)
1000.0000 mL | Freq: Once | INTRAVENOUS | Status: AC
  Administered 2015-02-02: 1000 mL via INTRAVENOUS

## 2015-02-02 MED ORDER — MORPHINE SULFATE 15 MG PO TABS
15.0000 mg | ORAL_TABLET | Freq: Once | ORAL | Status: AC
  Administered 2015-02-02: 15 mg via ORAL
  Filled 2015-02-02: qty 1

## 2015-02-02 MED ORDER — HYDROCORTISONE NA SUCCINATE PF 100 MG IJ SOLR
100.0000 mg | Freq: Once | INTRAMUSCULAR | Status: AC
  Administered 2015-02-02: 100 mg via INTRAVENOUS
  Filled 2015-02-02: qty 2

## 2015-02-02 MED ORDER — HYDROCORTISONE 5 MG PO TABS
5.0000 mg | ORAL_TABLET | Freq: Once | ORAL | Status: AC
  Administered 2015-02-02: 5 mg via ORAL
  Filled 2015-02-02: qty 1

## 2015-02-02 MED ORDER — ONDANSETRON HCL 4 MG/2ML IJ SOLN
4.0000 mg | Freq: Once | INTRAMUSCULAR | Status: AC
  Administered 2015-02-02: 4 mg via INTRAVENOUS
  Filled 2015-02-02: qty 2

## 2015-02-02 NOTE — ED Notes (Signed)
Pt attempting to use urinal.

## 2015-02-02 NOTE — ED Notes (Signed)
Requested med from pharmacy  

## 2015-02-02 NOTE — Discharge Instructions (Signed)

## 2015-02-02 NOTE — ED Notes (Signed)
Pt unable to provide urine specimen at this time

## 2015-02-02 NOTE — ED Notes (Signed)
Attempted to start IV. Pt refuses to be stuck in [facility] or hand. Pt didn't tolerate stick well.

## 2015-02-02 NOTE — ED Notes (Signed)
Pt is in stable condition upon d/c and ambulates from ED. 

## 2015-02-02 NOTE — ED Notes (Signed)
Pt arrives via POV from home with hx of hypotension with standing. Per wife patient vomited this AM after taking daily meds. Pt lethargic, nauseated and c/o chronic back pain. VSS.

## 2015-02-02 NOTE — ED Notes (Signed)
Patient transported to X-ray 

## 2015-02-02 NOTE — ED Notes (Signed)
Pt is given a urinal he is aware we need a urine sample

## 2015-02-02 NOTE — ED Notes (Signed)
Talked to pharm tech rt verifying PRN meds pt takes at home for chronic back pain. Morphine  q6h PRN confirmed. Dr. Littie Deeds confirmed to medicate pt. Pt last had at 0630 this morning.

## 2015-02-02 NOTE — ED Provider Notes (Signed)
CSN: [id number]     Arrival date & time 02/02/15  1014 History   First MD Initiated Contact with Patient 02/02/15 1105     Chief Complaint  Patient presents with  . Near Syncope     (Consider location/radiation/quality/duration/timing/severity/associated sxs/prior Treatment) Patient is a 49 y.o. male presenting with vomiting.  Emesis Severity:  Moderate Duration:  2 weeks Timing:  Intermittent Quality:  Stomach contents Progression:  Unchanged Chronicity:  New Recent urination:  Normal Relieved by:  Nothing Worsened by:  Nothing tried Ineffective treatments:  None tried Associated symptoms: myalgias   Associated symptoms: no abdominal pain, no diarrhea and no fever     Past Medical History  Diagnosis Date  . Back pain   . DDD (degenerative disc disease), cervical   . Spinal stenosis   . Headache     migraines   Past Surgical History  Procedure Laterality Date  . Back surgery  2002,2009    x 2, lower , job injury, spinal stenosis   Family History  Problem Relation Age of Onset  . Heart disease Maternal Grandmother   . Cancer Maternal Grandfather   . Hypertension Mother    Social History  Substance Use Topics  . Smoking status: Former Smoker    Quit date: 05/02/2007  . Smokeless tobacco: Never Used  . Alcohol Use: No    Review of Systems  Gastrointestinal: Positive for vomiting. Negative for abdominal pain and diarrhea.  Musculoskeletal: Positive for myalgias.  All other systems reviewed and are negative.     Allergies  Antihistamines, chlorpheniramine-type; Other; and Phenazopyridine  Home Medications   Prior to Admission medications   Medication Sig Start Date End Date Taking? Authorizing Provider  docusate sodium (COLACE) 100 MG capsule Take 200 mg by mouth daily.   Yes Historical Provider, MD  fludrocortisone (FLORINEF) 0.1 MG tablet Take 3 tablets (0.3 mg total) by mouth daily. 01/17/15  Yes Collene Gobble, MD  gabapentin (NEURONTIN) 300 MG  capsule Take 1 capsule (300 mg total) by mouth 3 (three) times daily. 12/13/14  Yes Alyssa A Kennon Rounds, MD  hydrocortisone (CORTEF) 5 MG tablet Take 2 tablets ( ) daily at 7am, take  daily with lunch, and take  daily at 4pm Patient taking differently: Take 5-10 mg by mouth 2 (two) times daily. Take 2 tablets in the morning and 1 at 4pm 01/17/15  Yes Collene Gobble, MD  ibuprofen (ADVIL,MOTRIN) 800 MG tablet Take 800 mg by mouth every 8 (eight) hours as needed for moderate pain.    Yes Historical Provider, MD  meloxicam (MOBIC) 7.5 MG tablet Take 7.5 mg by mouth daily.   Yes Historical Provider, MD  metoCLOPramide (REGLAN) 10 MG tablet Take 1 tablet (10 mg total) by mouth every 6 (six) hours. 02/01/15  Yes Renne Crigler, PA-C  morphine (MSIR) 15 MG tablet Take 1 tablet (15 mg total) by mouth every 6 (six) hours as needed for severe pain. 01/17/15  Yes Collene Gobble, MD  venlafaxine (EFFEXOR) 25 MG tablet Take 1 tablet (25 mg total) by mouth 2 (two) times daily with a meal. 12/13/14  Yes Alyssa A Haney, MD  promethazine (PHENERGAN) 25 MG tablet Take 1 tablet (25 mg total) by mouth every 6 (six) hours as needed for nausea or vomiting. Patient not taking: Reported on 02/02/2015 01/29/15   Marily Memos, MD   BP 110/85 mmHg  Pulse 94  Temp(Src) 98.5 F (36.9 C) (Oral)  Resp 23  SpO2 99% Physical Exam  Constitutional:  He is oriented to person, place, and time. He appears well-developed and well-nourished.  HENT:  Head: Normocephalic and atraumatic.  Eyes: Conjunctivae and EOM are normal.  Neck: Normal range of motion. Neck supple.  Cardiovascular: Normal rate, regular rhythm and normal heart sounds.   Pulmonary/Chest: Effort normal and breath sounds normal. No respiratory distress.  Abdominal: He exhibits no distension. There is generalized tenderness. There is no rebound and no guarding.  Musculoskeletal: Normal range of motion.  Neurological: He is alert and oriented to person, place, and time.   Skin: Skin is warm and dry.  Vitals reviewed.   ED Course  Procedures (including critical care time) Labs Review Labs Reviewed  BASIC METABOLIC PANEL - Abnormal; Notable for the following:    Sodium 133 (*)    Potassium 3.0 (*)    Chloride 98 (*)    Glucose, Bld 133 (*)    All other components within normal limits  CBC - Abnormal; Notable for the following:    RBC 3.70 (*)    Hemoglobin 12.2 (*)    HCT 34.7 (*)    All other components within normal limits  CBG MONITORING, ED - Abnormal; Notable for the following:    Glucose-Capillary 131 (*)    All other components within normal limits  URINALYSIS, ROUTINE W REFLEX MICROSCOPIC (NOT AT [facility])  Rosezena Sensor, ED    Imaging Review Dg Chest 2 View  02/02/2015   CLINICAL DATA:  Hypotension and lethargy.  Near syncope.  EXAM: CHEST  2 VIEW  COMPARISON:  12/04/2014 and 02/03/2009  FINDINGS: The heart size and mediastinal contours are within normal limits. Both lungs are clear. The visualized skeletal structures are unremarkable.  IMPRESSION: Normal chest.   Electronically Signed   By: Francene Boyers M.D.   On: 02/02/2015 14:58   I have personally reviewed and evaluated these images and lab results as part of my medical decision-making.   EKG Interpretation   Date/Time:  Tuesday February 02 2015 10:19:49 EDT Ventricular Rate:  103 PR Interval:  140 QRS Duration: 86 QT Interval:  330 QTC Calculation: 432 R Axis:   70 Text Interpretation:  Sinus tachycardia T wave abnormality, consider  inferolateral ischemia Abnormal ECG new t wave inversions in inferolateral  leads Confirmed by Mirian Mo [id number]) on 02/02/2015 11:13:15 AM      MDM   Final diagnoses:  Non-intractable vomiting with nausea, vomiting of unspecified type    49 y.o. male with pertinent PMH of ? Adrenal insufficiency on chronic hydrocortisone presents with recurrent weakness, nausea, and vomiting.  Vomited after hydrocortisone this am, so presents for  evaluation.  This is typical of his flares.  On arrival vitals and physical exam as above.  Pt is not actively vomiting on my exam.  Mild generalized abd tenderness.  Wu as above.  Given hydrocortisone IV, as well as zofran.  Vast improvement in symptoms.  Discussed and offered admission, pt elected to dc home.  DC home in stable condition.    I have reviewed all laboratory and imaging studies if ordered as above  1. Non-intractable vomiting with nausea, vomiting of unspecified type         Mirian Mo, MD 02/03/15 [id number]

## 2015-02-03 ENCOUNTER — Ambulatory Visit (INDEPENDENT_AMBULATORY_CARE_PROVIDER_SITE_OTHER): Payer: Medicare Other | Admitting: Emergency Medicine

## 2015-02-03 VITALS — BP 134/78 | HR 99 | Temp 98.5°F | Resp 17 | Ht 70.0 in | Wt 181.0 lb

## 2015-02-03 DIAGNOSIS — R112 Nausea with vomiting, unspecified: Secondary | ICD-10-CM | POA: Diagnosis not present

## 2015-02-03 DIAGNOSIS — K30 Functional dyspepsia: Secondary | ICD-10-CM

## 2015-02-03 DIAGNOSIS — M545 Low back pain, unspecified: Secondary | ICD-10-CM

## 2015-02-03 MED ORDER — MORPHINE SULFATE 15 MG PO TABS: 15.0000 mg | ORAL_TABLET | Freq: Four times a day (QID) | ORAL | Status: DC | PRN

## 2015-02-03 MED ORDER — GABAPENTIN 300 MG PO CAPS: 300.0000 mg | ORAL_CAPSULE | Freq: Three times a day (TID) | ORAL | Status: DC

## 2015-02-03 MED ORDER — METOCLOPRAMIDE HCL 10 MG PO TABS: 10.0000 mg | ORAL_TABLET | Freq: Four times a day (QID) | ORAL | Status: DC

## 2015-02-03 NOTE — Patient Instructions (Signed)
Gastroparesis °Gastroparesis, also called delayed gastric emptying, is a condition in which food takes longer than normal to empty from the stomach. The condition is usually long-lasting (chronic). °CAUSES °This condition may be caused by: °· An endocrine disorder, such as hypothyroidism or diabetes. Diabetes is the most common cause of this condition. °· A nervous system disease, such as Parkinson disease or multiple sclerosis. °· Cancer, infection, or surgery of the stomach or vagus nerve. °· A connective tissue disorder, such as scleroderma. °· Certain medicines. °In most cases, the cause is not known. °RISK FACTORS °This condition is more likely to develop in: °· People with certain disorders, including endocrine disorders, eating disorders, amyloidosis, and scleroderma. °· People with certain diseases, including Parkinson disease or multiple sclerosis. °· People with cancer or infection of the stomach or vagus nerve. °· People who have had surgery on the stomach or vagus nerve. °· People who take certain medicines. °· Women. °SYMPTOMS °Symptoms of this condition include: °· An early feeling of fullness when eating. °· Nausea. °· Weight loss. °· Vomiting. °· Heartburn. °· Abdominal bloating. °· Inconsistent blood glucose levels. °· Lack of appetite. °· Acid from the stomach coming up into the esophagus (gastroesophageal reflux). °· Spasms of the stomach. °Symptoms may come and go. °DIAGNOSIS °This condition is diagnosed with tests, such as: °· Tests that check how long it takes food to move through the stomach and intestines. These tests include: °¨ Upper gastrointestinal (GI) series. In this test, X-rays of the intestines are taken after you drink a liquid. The liquid makes the intestines show up better on the X-rays. °¨ Gastric emptying scintigraphy. In this test, scans are taken after you eat food that contains a small amount of radioactive material. °¨ Wireless capsule GI monitoring system. This test  involves swallowing a capsule that records information about movement through the stomach. °· Gastric manometry. This test measures electrical and muscular activity in the stomach. It is done with a thin tube that is passed down the throat and into the stomach. °· Endoscopy. This test checks for abnormalities in the lining of the stomach. It is done with a long, thin tube that is passed down the throat and into the stomach. °· An ultrasound. This test can help rule out gallbladder disease or pancreatitis as a cause of your symptoms. It uses sound waves to take pictures of the inside of your body. °TREATMENT °There is no cure for gastroparesis. This condition may be managed with: °· Treatment of the underlying condition causing the gastroparesis. °· Lifestyle changes, including exercise and dietary changes. Dietary changes can include: °¨ Changes in what and when you eat. °¨ Eating smaller meals more often. °¨ Eating low-fat foods. °¨ Eating low-fiber forms of high-fiber foods, such as cooked vegetables instead of raw vegetables. °¨ Having liquid foods in place of solid foods. Liquid foods are easier to digest. °· Medicines. These may be given to control nausea and vomiting and to stimulate stomach muscles. °· Getting food through a feeding tube. This may be done in severe cases. °· A gastric neurostimulator. This is a device that is inserted into the body with surgery. It helps improve stomach emptying and control nausea and vomiting. °HOME CARE INSTRUCTIONS °· Follow your health care provider's instructions about exercise and diet. °· Take medicines only as directed by your health care provider. °SEEK MEDICAL CARE IF: °· Your symptoms do not improve with treatment. °· You have new symptoms. °SEEK IMMEDIATE MEDICAL CARE IF: °· You have   severe abdominal pain that does not improve with treatment. °· You have nausea that does not go away. °· You cannot keep fluids down. °  °This information is not intended to replace  advice given to you by your health care provider. Make sure you discuss any questions you have with your health care provider. °  °Document Released: 04/17/2005 Document Revised: 09/01/2014 Document Reviewed: 04/13/2014 °Elsevier Interactive Patient Education ©2016 [employer] ° °

## 2015-02-03 NOTE — Progress Notes (Signed)
This chart was scribed for Jimmy Chris, MD by Stann Ore, Medical Scribe. This patient was seen in Room 13 and the patient's care was started 9:28 AM.  Chief Complaint:  Chief Complaint  Patient presents with  . Hospitalization Follow-up  . Emesis  . Dehydration    HPI: Jimmy [name] is a 49 y.o. male who reports to [facility] today for hospital follow up for vomiting.  He kept vomiting his medication up. His first visit was 5 days ago. Yesterday, he went again and was on fluids and his symptoms improved. He had some dry heaves this morning, but was able to keep fluid and medication down. He was given reglan (taking every 6 hours) and is feeling better on it.   He still needs MRI of his brain.  He went to endocrinologist and needed more blood work done; however, he missed the appointment due to hospital visit. But, endo believes it's neurological.   He also needs medication refill of gabapentin and morphine. His previous prescriptions were done by orthopedics.   Wt Readings from Last 3 Encounters:  02/03/15 181 lb (82.101 kg)  01/27/15 182 lb (82.555 kg)  01/17/15 184 lb (83.462 kg)    Past Medical History  Diagnosis Date  . Back pain   . DDD (degenerative disc disease), cervical   . Spinal stenosis   . Headache     migraines   Past Surgical History  Procedure Laterality Date  . Back surgery  2002,2009    x 2, lower , job injury, spinal stenosis   Social History   Social History  . Marital Status: Married    Spouse Name: N/A  . Number of Children: 4  . Years of Education: 12   Occupational History  .      disabled   Social History Main Topics  . Smoking status: Former Smoker    Quit date: 05/02/2007  . Smokeless tobacco: Never Used  . Alcohol Use: No  . Drug Use: No  . Sexual Activity: Yes   Other Topics Concern  . None   Social History Narrative   Lives at home with wife, children   Caffeine use- 1 soda a day   Family History  Problem Relation  Age of Onset  . Heart disease Maternal Grandmother   . Cancer Maternal Grandfather   . Hypertension Mother    Allergies  Allergen Reactions  . Antihistamines, Chlorpheniramine-Type Other (See Comments)    Hallucinations  . Other     pyridium  . Phenazopyridine Hives and Other (See Comments)    Pyridium. Dizziness and indigestion   Prior to Admission medications   Medication Sig Start Date End Date Taking? Authorizing Provider  docusate sodium (COLACE) 100 MG capsule Take 200 mg by mouth daily.    Historical Provider, MD  fludrocortisone (FLORINEF) 0.1 MG tablet Take 3 tablets (0.3 mg total) by mouth daily. 01/17/15   Collene Gobble, MD  gabapentin (NEURONTIN) 300 MG capsule Take 1 capsule (300 mg total) by mouth 3 (three) times daily. 12/13/14   Bonney Aid, MD  hydrocortisone (CORTEF) 5 MG tablet Take 2 tablets ( ) daily at 7am, take  daily with lunch, and take  daily at 4pm Patient taking differently: Take 5-10 mg by mouth 2 (two) times daily. Take 2 tablets in the morning and 1 at 4pm 01/17/15   Collene Gobble, MD  ibuprofen (ADVIL,MOTRIN) 800 MG tablet Take 800 mg by mouth every 8 (eight) hours as needed  for moderate pain.     Historical Provider, MD  meloxicam (MOBIC) 7.5 MG tablet Take 7.5 mg by mouth daily.    Historical Provider, MD  metoCLOPramide (REGLAN) 10 MG tablet Take 1 tablet (10 mg total) by mouth every 6 (six) hours. 02/01/15   Renne Crigler, PA-C  morphine (MSIR) 15 MG tablet Take 1 tablet (15 mg total) by mouth every 6 (six) hours as needed for severe pain. 01/17/15   Collene Gobble, MD  promethazine (PHENERGAN) 25 MG tablet Take 1 tablet (25 mg total) by mouth every 6 (six) hours as needed for nausea or vomiting. Patient not taking: Reported on 02/02/2015 01/29/15   Marily Memos, MD  venlafaxine [facility]) 25 MG tablet Take 1 tablet (25 mg total) by mouth 2 (two) times daily with a meal. 12/13/14   Bonney Aid, MD     ROS:  Constitutional: negative for chills,  fever, night sweats, weight changes, or fatigue  HEENT: negative for vision changes, hearing loss, congestion, rhinorrhea, ST, epistaxis, or sinus pressure Cardiovascular: negative for chest pain or palpitations Respiratory: negative for hemoptysis, wheezing, shortness of breath, or cough Abdominal: negative for abdominal pain, diarrhea, or constipation; positive for nausea, vomiting Dermatological: negative for rash Neurologic: negative for headache, dizziness, or syncope All other systems reviewed and are otherwise negative with the exception to those above and in the HPI.  PHYSICAL EXAM: Filed Vitals:   02/03/15 0855  BP: 134/78  Pulse: 99  Temp: 98.5 F (36.9 C)  Resp: 17   Body mass index is 25.97 kg/(m^2).  General: Alert, no acute distress; Appears pretty well hydrated HEENT:  Normocephalic, atraumatic, oropharynx patent. Eye: [facility] Cardiovascular:  Regular rate and rhythm, no rubs murmurs or gallops.  No Carotid bruits, radial pulse intact. No pedal edema.  Respiratory: Clear to auscultation bilaterally.  No wheezes, rales, or rhonchi.  No cyanosis, no use of accessory musculature Abdominal: No organomegaly; reduceable 3cm right periumbilical hernia Musculoskeletal: Gait intact. No edema, tenderness Skin: No rashes. Neurologic: Facial musculature symmetric. Psychiatric: Patient acts appropriately throughout our interaction.  Lymphatic: No cervical or submandibular lymphadenopathy Genitourinary/Anorectal: No acute findings  LABS: Results for orders placed or performed during the hospital encounter of 02/02/15  Basic metabolic panel  Result Value Ref Range   Sodium 133 (L) 135 - 145 mmol/L   Potassium 3.0 (L) 3.5 - 5.1 mmol/L   Chloride 98 (L) 101 - 111 mmol/L   CO2 24 22 - 32 mmol/L   Glucose, Bld 133 (H) 65 - 99 mg/dL   BUN 7 6 - 20 mg/dL   Creatinine, Ser 1.19 0.61 - 1.24 mg/dL   Calcium 9.3 8.9 - 14.7 mg/dL   GFR calc non Af Amer >60 >60 mL/min   GFR  calc Af Amer >60 >60 mL/min   Anion gap 11 5 - 15  CBC  Result Value Ref Range   WBC 4.0 4.0 - 10.5 K/uL   RBC 3.70 (L) 4.22 - 5.81 MIL/uL   Hemoglobin 12.2 (L) 13.0 - 17.0 g/dL   HCT 82.9 (L) 56.2 - 13.0 %   MCV 93.8 78.0 - 100.0 fL   MCH 33.0 26.0 - 34.0 pg   MCHC 35.2 30.0 - 36.0 g/dL   RDW 86.5 78.4 - 69.6 %   Platelets 194 150 - 400 K/uL  Urinalysis, Routine w reflex microscopic (not at [facility])  Result Value Ref Range   Color, Urine YELLOW YELLOW   APPearance CLEAR CLEAR   Specific Gravity,  Urine 1.013 1.005 - 1.030   pH 7.0 5.0 - 8.0   Glucose, UA NEGATIVE NEGATIVE mg/dL   Hgb urine dipstick NEGATIVE NEGATIVE   Bilirubin Urine NEGATIVE NEGATIVE   Ketones, ur NEGATIVE NEGATIVE mg/dL   Protein, ur NEGATIVE NEGATIVE mg/dL   Urobilinogen, UA 1.0 0.0 - 1.0 mg/dL   Nitrite NEGATIVE NEGATIVE   Leukocytes, UA NEGATIVE NEGATIVE  CBG monitoring, ED  Result Value Ref Range   Glucose-Capillary 131 (H) 65 - 99 mg/dL  I-stat troponin, ED  Result Value Ref Range   Troponin i, poc 0.00 0.00 - 0.08 ng/mL   Comment 3             EKG/XRAY:   Primary read interpreted by Dr. Cleta Alberts at [facility].   ASSESSMENT/PLAN: Very complicated case. There is significant concern about autonomic dysfunction. He has had a MRI of the thoracic and lumbar spine already and is scheduled for an MRI of the brain. His recent symptoms sound like delayed gastric emptying. He has responded well to Reglan. I will continue this. Referral made to Dr. Elnoria Howard. He will continue follow-up with Dr. Garlan Fair and Dr. Elvera Lennox. His morphine was refilled. I will recheck in 2 weeks. Flu shot at that time.  By signing my name below, I, Stann Ore, attest that this documentation has been prepared under the direction and in the presence of Jimmy Chris, MD. Electronically Signed: Stann Ore, Scribe. 02/03/2015 , 9:40 AM .    Michaell Cowing sideeffects, risk and benefits, and alternatives of medications d/w patient. Patient is aware that  all medications have potential sideeffects and we are unable to predict every sideeffect or drug-drug interaction that may occur.  Jimmy Chris MD 02/03/2015 9:40 AM

## 2015-02-08 ENCOUNTER — Ambulatory Visit
Admission: RE | Admit: 2015-02-08 | Discharge: 2015-02-08 | Disposition: A | Discharge status: Home / Self Care | Payer: Medicare Other | Source: Ambulatory Visit | Attending: Diagnostic Neuroimaging | Admitting: Diagnostic Neuroimaging

## 2015-02-08 DIAGNOSIS — R531 Weakness: Secondary | ICD-10-CM

## 2015-02-08 DIAGNOSIS — R339 Retention of urine, unspecified: Secondary | ICD-10-CM

## 2015-02-08 DIAGNOSIS — K5900 Constipation, unspecified: Secondary | ICD-10-CM

## 2015-02-08 DIAGNOSIS — I951 Orthostatic hypotension: Secondary | ICD-10-CM

## 2015-02-08 MED ORDER — GADOBENATE DIMEGLUMINE 529 MG/ML IV SOLN: 17.0000 mL | Freq: Once | INTRAVENOUS | Status: DC | PRN

## 2015-02-10 ENCOUNTER — Ambulatory Visit (INDEPENDENT_AMBULATORY_CARE_PROVIDER_SITE_OTHER): Payer: Medicare Other | Admitting: Diagnostic Neuroimaging

## 2015-02-10 ENCOUNTER — Encounter: Payer: Self-pay | Admitting: Diagnostic Neuroimaging

## 2015-02-10 VITALS — BP 97/65 | HR 100 | Ht 70.0 in | Wt 184.0 lb

## 2015-02-10 DIAGNOSIS — R339 Retention of urine, unspecified: Secondary | ICD-10-CM | POA: Diagnosis not present

## 2015-02-10 DIAGNOSIS — I951 Orthostatic hypotension: Secondary | ICD-10-CM

## 2015-02-10 DIAGNOSIS — G909 Disorder of the autonomic nervous system, unspecified: Secondary | ICD-10-CM | POA: Diagnosis not present

## 2015-02-10 NOTE — Patient Instructions (Signed)
Thank you for coming to see [place] at [facility]. I hope we have been able to provide you high quality care today.  You may receive a patient satisfaction survey over the next few weeks. We would appreciate your feedback and comments so that we may continue to improve ourselves and the health of our patients.  - trial of northera to improve blood pressure - monitor vitals / BP at home (2-3 x per day)   ~~~~~~~~~~~~~~~~~~~~~~~~~~~~~~~~~~~~~~~~~~~~~~~~~~~~~~~~~~~~~~~~~  DR. Mima Cranmore'S GUIDE TO HAPPY AND HEALTHY LIVING These are some of my general health and wellness recommendations. Some of them may apply to you better than others. Please use common sense as you try these suggestions and feel free to ask me any questions.   ACTIVITY/FITNESS Mental, social, emotional and physical stimulation are very important for brain and body health. Try learning a new activity (arts, music, language, sports, games).  Keep moving your body to the best of your abilities.    NUTRITION Eat more plants: colorful vegetables, nuts, seeds and berries.  Eat less sugar, salt, preservatives and processed foods.  Avoid toxins such as cigarettes and alcohol.  Drink water when you are thirsty. Warm water with a slice of lemon is an excellent morning drink to start the day.  Consider these websites for more information The Nutrition Source (https://www.henry-hernandez.biz/) [employer] Nutrition (WindowBlog.ch)   RELAXATION Consider practicing mindfulness meditation or other relaxation techniques such as deep breathing, prayer, yoga, tai chi, massage. See website mindful.org or the apps Headspace or Calm to help get started.   SLEEP Try to get at least 7-8+ hours sleep per day. Regular exercise and reduced caffeine will help you sleep better. Practice good sleep hygeine techniques. See website sleep.org for more information.   PLANNING Prepare  estate planning, living will, healthcare POA documents. Sometimes this is best planned with the help of an attorney. Theconversationproject.org and agingwithdignity.org are excellent resources.

## 2015-02-10 NOTE — Progress Notes (Signed)
[facility]  PATIENT: Jimmy [name] DOB: 08-05-1965  REFERRING CLINICIAN: Daub, S HISTORY FROM: patient and wife  REASON FOR VISIT: follow up    HISTORICAL  CHIEF COMPLAINT:  Chief Complaint  Patient presents with  . Orthostatic hypotension    rm 6  . Follow-up    1 month    HISTORY OF PRESENT ILLNESS:   UPDATE 02/10/15: Since last visit, no new events. Still with orthostatic hypotension and lightheadedness and dizziness. Also struggling with severe low back pain radiating to the legs. Still on florinef.   PRIOR HPI (01/11/15): 49 year old left-handed male here for evaluation of orthostatic hypotension, dysautonomia. In June patient had left C1 herpes zoster ophthalmicus, treated with antivirals therapy. In July patient began to have intermittent episodes of hypotension, dizziness, urinary retention and ultimately leading to syncope. Patient was evaluated at the hospital 3 times including most recent admission with discharge on 12/23/2014. During this time he has been diagnosed with prostatitis, mild renal insufficiency, postural orthostatic tachycardia syndrome. He was tried on Midodrine and Florinef, and ultimately discharged on Florinef plus hydrocortisone. Had problems with urinary sxs with midodrine. Outpatient neurology follow-up is pending. For prostatitis he was discharged on anti-biotics to complete a 6 week course. He also has urology follow-up for urinary retention. Patient also set up for follow-up in my neurology clinic today. Patient still having significant problems standing and walking. He also has chronic low back pain, history of spinal stenosis status post surgery. Patient has had back surgeries in 2002 and 2009. He has chronic pain syndrome. Is on long-term narcotic medications.   REVIEW OF SYSTEMS: Full 14 system review of systems performed and notable only for chills fatigue trouble swallowing freq waking itching confusion anxietyt dizziness  numbness tremors passing out.    ALLERGIES: Allergies  Allergen Reactions  . Antihistamines, Chlorpheniramine-Type Other (See Comments)    Hallucinations  . Other     pyridium  . Phenazopyridine Hives and Other (See Comments)    Pyridium. Dizziness and indigestion    HOME MEDICATIONS: Outpatient Prescriptions Prior to Visit  Medication Sig Dispense Refill  . docusate sodium (COLACE) 100 MG capsule Take 200 mg by mouth daily.    . fludrocortisone (FLORINEF) 0.1 MG tablet Take 3 tablets (0.3 mg total) by mouth daily. 90 tablet 11  . gabapentin (NEURONTIN) 300 MG capsule Take 1 capsule (300 mg total) by mouth 3 (three) times daily. 90 capsule 3  . hydrocortisone (CORTEF) 5 MG tablet Take 2 tablets (10mg ) daily at 7am, take 5mg  daily with lunch, and take 5mg  daily at 4pm (Patient taking differently: Take 5-10 mg by mouth 2 (two) times daily. Take 2 tablets in the morning and 1 at 4pm) 120 tablet 3  . ibuprofen (ADVIL,MOTRIN) 800 MG tablet Take 800 mg by mouth every 8 (eight) hours as needed for moderate pain.     . meloxicam (MOBIC) 7.5 MG tablet Take 7.5 mg by mouth daily.    . metoCLOPramide (REGLAN) 10 MG tablet Take 1 tablet (10 mg total) by mouth every 6 (six) hours. 90 tablet 3  . morphine (MSIR) 15 MG tablet Take 1 tablet (15 mg total) by mouth every 6 (six) hours as needed for severe pain. 60 tablet 0  . venlafaxine (EFFEXOR) 25 MG tablet Take 1 tablet (25 mg total) by mouth 2 (two) times daily with a meal. 30 tablet 3  . promethazine (PHENERGAN) 25 MG tablet Take 1 tablet (25 mg total) by mouth every 6 (six) hours  as needed for nausea or vomiting. (Patient not taking: Reported on 02/02/2015) 10 tablet 0   No facility-administered medications prior to visit.    PAST MEDICAL HISTORY: Past Medical History  Diagnosis Date  . Back pain   . DDD (degenerative disc disease), cervical   . Spinal stenosis   . Headache     migraines    PAST SURGICAL HISTORY: Past Surgical History    Procedure Laterality Date  . Back surgery  2002,2009    x 2, lower , job injury, spinal stenosis    FAMILY HISTORY: Family History  Problem Relation Age of Onset  . Heart disease Maternal Grandmother   . Cancer Maternal Grandfather   . Hypertension Mother     SOCIAL HISTORY:  Social History   Social History  . Marital Status: Married    Spouse Name: N/A  . Number of Children: 4  . Years of Education: 12   Occupational History  .      disabled   Social History Main Topics  . Smoking status: Former Smoker    Quit date: 05/02/2007  . Smokeless tobacco: Never Used  . Alcohol Use: No  . Drug Use: No  . Sexual Activity: Yes   Other Topics Concern  . Not on file   Social History Narrative   Lives at home with wife, children   Caffeine use- 1 soda a day     PHYSICAL EXAM  GENERAL EXAM/CONSTITUTIONAL: Vitals:  Filed Vitals:   02/10/15 1132 02/10/15 1135 02/10/15 1142 02/10/15 1143  BP:  137/84 119/77 97/65  Pulse:  81 86 100  Height:  (1.778 m)     Weight: 184 lb (83.462 kg)       Body mass index is 26.4 kg/(m^2). No exam data present  Patient is in no distress; well developed, nourished and groomed; neck is supple  DRY MUCOUS MEMBRANES; GENERALIZED MALAISE/FRAIL APPEARANCE  UNCOMFORTABLE APPEARING DUE TO BACK PAIN  CARDIOVASCULAR:  Examination of carotid arteries is normal; no carotid bruits  Regular rate and rhythm, no murmurs  Examination of peripheral vascular system by observation and palpation is normal  EYES:  Ophthalmoscopic exam of optic discs and posterior segments is normal; no papilledema or hemorrhages  MUSCULOSKELETAL:  Gait, strength, tone, movements noted in Neurologic exam below  NEUROLOGIC: MENTAL STATUS:  No flowsheet data found.  awake, alert, oriented to person, place and time  recent and remote memory intact  normal attention and concentration  language fluent, comprehension intact, naming intact,   fund  of knowledge appropriate  CRANIAL NERVE:   2nd - no papilledema on fundoscopic exam  2nd, 3rd, 4th, 6th - pupils equal and reactive to light, visual fields full to confrontation, extraocular muscles intact, no nystagmus  5th - facial sensation symmetric  7th - facial strength symmetric  8th - hearing intact  9th - palate elevates symmetrically, uvula midline  11th - shoulder shrug symmetric  12th - tongue protrusion midline  MOTOR:   normal bulk and tone; BUE 4; BLE (HF 3, KE/KF 4, DF 3)  SENSORY:   normal and symmetric to light touch, temperature, vibration; ABSENT VIB AT TOES  COORDINATION:   finger-nose-finger, fine finger movements SLOW  REFLEXES:   deep tendon reflexes TRACE and symmetric; ABSENT AT ANKLES  GAIT/STATION:   narrow based gait; USES A WALKER    DIAGNOSTIC DATA (LABS, IMAGING, TESTING) - I reviewed patient records, labs, notes, testing and imaging myself where available.  Lab Results  Component  Value Date   WBC 4.0 02/02/2015   HGB 12.2* 02/02/2015   HCT 34.7* 02/02/2015   MCV 93.8 02/02/2015   PLT 194 02/02/2015      Component Value Date/Time   NA 133* 02/02/2015 1142   K 3.0* 02/02/2015 1142   CL 98* 02/02/2015 1142   CO2 24 02/02/2015 1142   GLUCOSE 133* 02/02/2015 1142   BUN 7 02/02/2015 1142   CREATININE 0.89 02/02/2015 1142   CREATININE 1.07 01/17/2015 1014   CALCIUM 9.3 02/02/2015 1142   PROT 6.8 02/01/2015 0633   ALBUMIN 3.7 02/01/2015 0633   AST 20 02/01/2015 0633   ALT 14* 02/01/2015 0633   ALKPHOS 74 02/01/2015 0633   BILITOT 1.1 02/01/2015 0633   GFRNONAA >60 02/02/2015 1142   GFRNONAA 82 01/17/2015 1014   GFRAA >60 02/02/2015 1142   GFRAA >89 01/17/2015 1014   No results found for: CHOL, HDL, LDLCALC, LDLDIRECT, TRIG, CHOLHDL Lab Results  Component Value Date   HGBA1C 6.5* 12/04/2014   Lab Results  Component Value Date   VITAMINB12 378 12/22/2014   Lab Results  Component Value Date   TSH 1.145  12/04/2014    12/17/14 CT head [I reviewed images myself and agree with interpretation. -VRP]  - negative   12/08/14 MRI right hip - Negative examination. There is no evidence of septic hip. No abnormality to explain the patient's symptoms is identified.   11/30/14 MRI lumbar spine  1. Overall stable appearance of the lumbar spine with sequelae of prior decompressive laminectomy at L4-5. No recurrent stenosis at this level. 2. Congenital spinal stenosis with superimposed mild multilevel degenerative changes as above, overall relatively similar to prior MRI from 2011. No severe canal stenosis or evidence of cord compression. 3. Distended urinary bladder.  02/08/15 MRI brain  - normal  01/27/15 MRI cervical spine (with and without) demonstrating: 1. At C4-5: leftward disc bulging with severe left foraminal stenosis 2. At C5-6: uncovertebral joint hypertrophy with moderate left foraminal stenosis  3. At C6-7: uncovertebral joint hypertrophy and facet hypertrophy with mild right and moderate left foraminal stenosis  4. No intrinsic spinal cord lesions.  01/27/15 MRI thoracic spine (with and without) demonstrating: 1. At T3-4: right uncovertebral joint hypertrophy with moderate right foraminal stenosis  2. At T8-9: disc bulging with no spinal stenosis or foraminal narrowing  3. No intrinsic or compressive spinal cord lesions.     ASSESSMENT AND PLAN  49 y.o. year old male here with history of lumbar spinal stenosis, status post decompression surgery, chronic pain, now with progressive medical and neurologic decline since June 2016. Patient had left V1 herpes zoster ophthalmicus in June 2016 followed by progressive urinary retention, dizziness and now orthostatic intolerance/hypotension. 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    Suanne Marker, MD 02/10/2015, 12:35 PM Certified in Neurology, Neurophysiology and Neuroimaging  [facility] [address], [address] [place], [place] [address] [phone]

## 2015-02-17 ENCOUNTER — Ambulatory Visit: Payer: Medicare Other

## 2015-02-17 ENCOUNTER — Telehealth: Payer: Self-pay

## 2015-02-17 ENCOUNTER — Other Ambulatory Visit: Payer: Self-pay | Admitting: Emergency Medicine

## 2015-02-17 DIAGNOSIS — M545 Low back pain, unspecified: Secondary | ICD-10-CM

## 2015-02-17 MED ORDER — MORPHINE SULFATE 15 MG PO TABS: 15.0000 mg | ORAL_TABLET | Freq: Four times a day (QID) | ORAL | Status: DC | PRN

## 2015-02-17 MED ORDER — VENLAFAXINE HCL 25 MG PO TABS: 25.0000 mg | ORAL_TABLET | Freq: Two times a day (BID) | ORAL | Status: DC

## 2015-02-17 NOTE — Telephone Encounter (Signed)
Pt came in today to be seen but was unable to wait. Per Dr. Cleta Albertsaub, call pt to see what he was coming in for. Spoke with pt and his wife, Ava, and he needs a RF of Morphine and Effexor. Dr. Cleta Albertsaub, you are here on Fri and they were planning on coming back then. Please advise. Thanks

## 2015-02-17 NOTE — Progress Notes (Signed)
Patient notified RX ready for p/u 

## 2015-02-17 NOTE — Telephone Encounter (Signed)
Meds refilled.

## 2015-02-18 NOTE — Telephone Encounter (Signed)
[facility]

## 2015-02-22 ENCOUNTER — Telehealth: Payer: Self-pay

## 2015-02-22 ENCOUNTER — Telehealth: Payer: Self-pay | Admitting: Diagnostic Neuroimaging

## 2015-02-22 DIAGNOSIS — E876 Hypokalemia: Secondary | ICD-10-CM | POA: Diagnosis not present

## 2015-02-22 DIAGNOSIS — M545 Low back pain: Secondary | ICD-10-CM | POA: Diagnosis not present

## 2015-02-22 DIAGNOSIS — R112 Nausea with vomiting, unspecified: Secondary | ICD-10-CM | POA: Diagnosis not present

## 2015-02-22 LAB — BASIC METABOLIC PANEL
BUN: 8 mg/dL (ref 4–21)
Creatinine: 0.8 mg/dL (ref 0.6–1.3)

## 2015-02-22 NOTE — Telephone Encounter (Signed)
Spoke with wife who states in past 4 days patient's BP readings lying down have ranged close to 160/80; BP sitting have varied 120-160/80; BP standing 102-92/60. She states he has received his Northera but has not begun taking it. She wanted to ask Dr Marjory LiesPenumalli what BP readiing "is too high?" She is unsure whether to start patient on medication with his current BP readings while lying down. Informed her would route her question to Dr Marjory LiesPenumalli and call her back this afternoon. She verbalized understanding.

## 2015-02-22 NOTE — Telephone Encounter (Signed)
Can we refer? 

## 2015-02-22 NOTE — Telephone Encounter (Signed)
Wife called regarding Northera. Northera did arrive. Dr. Docia BarrierPenuamlli had advised to keep and eye on the low BP and to call if it gets to high. Wife forgot to ask what "too high" is. Please call to advise [phone] or if after 2pm today [phone].

## 2015-02-22 NOTE — Telephone Encounter (Signed)
Patient's wife called to ask about a pain management referral.  She said it was supposed to have been placed after his last OV on 02/03/15 with Dr Cleta Albertsaub, but I do not see a pain management referral in the system.  In the OV notes, the only reference to pain is the following: "His morphine was refilled. I will recheck in 2 weeks."  Please advise, thank you.  CB#: [phone]

## 2015-02-22 NOTE — Telephone Encounter (Signed)
Spoke with patient and gave him Dr [employer]enumalli's specific directions, advised he write them down. Patient repeated directions accurately, stated she had written them down. Informed him this RN will call this Thurs to touch base, but he or wife may call at any time before then. Informed him dr Marjory LiesPenumalli stated he should continue on that exact dose x 1 week as long as BP remains stable. Will then touch base to discuss any medication changes. Patient understands to call back as needed, verbalized understanding of conversation.

## 2015-02-22 NOTE — Telephone Encounter (Signed)
It looks to me like Dr. Cleta Albertsaub wants to continue figuring out the multiple complicated problems he's having before sending him to pain management.  Did the patient and/or his wife discuss the possibility of pain management referral with Dr. Cleta Albertsaub at the last visit on 10/05 or is this something that the wife wonders if would be appropriate?  Since there is not documentation in the note, I'm inclined to wait until Dr. Cleta Albertsaub re-evaluates the patient.  However, I am also happy to refer the patient if they discussed it at the last visit and it just didn't get done.

## 2015-02-22 NOTE — Telephone Encounter (Signed)
Ok to start northera 100mg  TID and monitor pressures. Keep SBP < 180 when laying down. -VRP

## 2015-02-23 NOTE — Telephone Encounter (Signed)
Spoke with pt, he states he is going to wait until Dr. Cleta Albertsaub gets back to discuss. He was supposed to come in last Wednesday but stated it was too crowded so he left and never saw Dr. Cleta Albertsaub. Pt states the morphine is helping.

## 2015-02-25 ENCOUNTER — Telehealth: Payer: Self-pay | Admitting: *Deleted

## 2015-02-25 NOTE — Telephone Encounter (Signed)
Spoke with wife, [name] re: patient's BP readings since beginning Northera on 02/22/15. She states the systolic readings have ranged in the 150's. Advised patient continue on current dose of 100 mg TID and this RN will call him on 03/01/15 for update. [name] verbalized understanding, agreement. She requested the call after noon and on their cell phone; verified cell number.

## 2015-03-01 ENCOUNTER — Telehealth: Payer: Self-pay | Admitting: *Deleted

## 2015-03-01 DIAGNOSIS — I951 Orthostatic hypotension: Secondary | ICD-10-CM

## 2015-03-01 DIAGNOSIS — E876 Hypokalemia: Secondary | ICD-10-CM | POA: Diagnosis not present

## 2015-03-01 NOTE — Telephone Encounter (Signed)
Left voice mail informing patient, per Dr Marjory LiesPenumalli, he is to continue on current dose of Northera 100 mg TID. Informed him Dr Marjory LiesPenumalli will make further decisions re: his care, and he will receive another call. Left this caller's name, number.

## 2015-03-01 NOTE — Telephone Encounter (Signed)
Spoke with wife, [name] who states patient has continued on Northera 100 mg TID. His laying down systolic BP readings have ranged from 150-174, highest reading was 184 x 1. She states his systolic standing readings have ranged 78-94. She states that he "has passed out while sitting down a couple times when his BP was really low". She states he continues to be dizzy with hypotensive readings, no other problems, complaints to report. Informed her would let Dr Marjory LiesPenumalli know and call her back with his response. She verbalized understanding. Advised if this RN does not call her back by end of day today, for patient to maintain current dose of Northera until further notice. She verbalized understanding.

## 2015-03-02 NOTE — Telephone Encounter (Signed)
Spoke with Jimmy [name], patient's wife and informed her that Dr Marjory LiesPenumalli has ordered referral to a cardiologist for her husband. Advised that if they don't hear back in a week to call this office and ask for Darreld Mcleanana Cox, referral coordinator.  She stated her husband is doing well, "no changes". Advised he continue to current dose of Northera and continue with BP checks. Advised wife to call for any changes, concerns , questions. She verbalized understanding.

## 2015-03-07 ENCOUNTER — Ambulatory Visit (INDEPENDENT_AMBULATORY_CARE_PROVIDER_SITE_OTHER): Payer: Medicare Other | Admitting: Physician Assistant

## 2015-03-07 ENCOUNTER — Telehealth: Payer: Self-pay | Admitting: Physician Assistant

## 2015-03-07 VITALS — BP 118/72 | HR 95 | Temp 98.6°F | Resp 16 | Ht 70.0 in | Wt 172.0 lb

## 2015-03-07 DIAGNOSIS — M545 Low back pain, unspecified: Secondary | ICD-10-CM

## 2015-03-07 DIAGNOSIS — Z23 Encounter for immunization: Secondary | ICD-10-CM

## 2015-03-07 MED ORDER — MORPHINE SULFATE 15 MG PO TABS: 30.0000 mg | ORAL_TABLET | Freq: Four times a day (QID) | ORAL | Status: DC | PRN

## 2015-03-07 MED ORDER — GABAPENTIN 400 MG PO CAPS: 400.0000 mg | ORAL_CAPSULE | Freq: Three times a day (TID) | ORAL | Status: DC

## 2015-03-07 MED ORDER — MELOXICAM 7.5 MG PO TABS: 7.5000 mg | ORAL_TABLET | Freq: Two times a day (BID) | ORAL | Status: DC

## 2015-03-07 NOTE — Progress Notes (Signed)
Patient ID: Jimmy [name], male    DOB: Apr 28, 1966, 49 y.o.   MRN: [id number]  PCP: Lucilla Edin, MD  Subjective:  No chief complaint on file.   HPI Patient presents today for medication refill of morphine for chronic back related to spinal stenosis and degenerative disc disease requiring prior surgeries in 2002 and 2009. Awaiting a third surgery, but cannot proceed until BP/orthostatic hypotension is under control. Patient is accompanied today by his wife, Jimmy [name], who is the primary historian and handler of his medications.   Currently takes 15 mg morphine q 6 hours without significant pain relief now. Believes the morphine is just not doing its job anymore. States his pain was previously a 5 or 6 out of 10 on the pain scale, but now it hovers around 7 or 8 out of 10. Pain is worse if he lies down for longer than an hour or walks for longer than a few minutes. Associated with occasional sciatic nerve pain in both legs. Ambulates with a walker when he is out and about. Has been on disability since 2013.   Prescription last filled by Dr. Cleta Alberts on 02/17/15. #60 capsules were filled on 10/22, per the [place] Controlled substance database.  The Rx was printed twice on 10/19, but the patient and his wife report they only received one. His wife puts the medications in a daily pill box, and sates that he doesn't over use them, and that they are out.  Patient also takes 800 mg ibuprofen q 6 hours (versus q 8 hours as prescribed), meloxicam 7.5 mg daily, and gabapentin 300 mg TID for his chronic pain.   Patient fell on his right hip approx. 4 months ago. Having increased swelling on the right side x 2-3 months. Denies any clicking or popping. Cannot bear weight at all on the right hip. MRI of the right hip in August was normal. It's suspected that the hip pain is due to his back disease.  Has appointment with neurology tomorrow for follow-up of orthostatic hypotension and has been referred to cardiology (visit  is 03/17/2015). Started 100 mg Northera TID on 02/22/15 by Dr. Marjory Lies.  Endocrinology is following patient's low cortisol levels. Next appointment scheduled for 07/21/2015.   Patient reports he will see Dr. Cleta Alberts later this month for further evaluation and possible pain management referral.   Patient would like to get his annual flu vaccination today.    Review of Systems As above.    Patient Active Problem List   Diagnosis Date Noted  . Low serum cortisol level (HCC) 01/27/2015  . Hyperglycemia 01/03/2015  . Chronic pain syndrome   . Hyponatremia   . Hypotension, postural   . Syncope 12/17/2014  . Faintness   . Right hip pain   . Abnormal EKG 12/12/2014  . POTS (postural orthostatic tachycardia syndrome)   . Adrenal insufficiency (HCC)   . Groin pain   . Orthostatic hypotension   . Lower abdominal pain 12/04/2014  . Weight loss, unintentional   . Absolute anemia   . Prostatitis 12/03/2014  . Abdominal pain 12/02/2014  . Hypokalemia 12/02/2014  . Urinary retention 12/02/2014  . Prostatitis, acute   . Abdominal pain, lower   . Chronic lower back pain      Prior to Admission medications   Medication Sig Start Date End Date Taking? Authorizing Provider  docusate sodium (COLACE) 100 MG capsule Take 200 mg by mouth daily.   Yes Historical Provider, MD  Droxidopa 100 MG CAPS  Take 100 mg by mouth 3 (three) times daily. 02/22/15  Yes Suanne Marker, MD  fludrocortisone (FLORINEF) 0.1 MG tablet Take 3 tablets (0.3 mg total) by mouth daily. 01/17/15  Yes Collene Gobble, MD  gabapentin (NEURONTIN) 300 MG capsule Take 1 capsule (300 mg total) by mouth 3 (three) times daily. 02/03/15  Yes Collene Gobble, MD  hydrocortisone (CORTEF) 5 MG tablet Take 2 tablets ( ) daily at 7am, take  daily with lunch, and take  daily at 4pm Patient taking differently: Take 5-10 mg by mouth 2 (two) times daily. Take 2 tablets in the morning and 1 at 4pm 01/17/15  Yes Collene Gobble, MD    ibuprofen (ADVIL,MOTRIN) 800 MG tablet Take 800 mg by mouth every 8 (eight) hours as needed for moderate pain.    Yes Historical Provider, MD  meloxicam (MOBIC) 7.5 MG tablet Take 7.5 mg by mouth daily.   Yes Historical Provider, MD  morphine (MSIR) 15 MG tablet Take 1 tablet (15 mg total) by mouth every 6 (six) hours as needed for severe pain. 02/17/15  Yes Collene Gobble, MD  morphine (MSIR) 15 MG tablet Take 1 tablet (15 mg total) by mouth every 6 (six) hours as needed for severe pain. 02/17/15  Yes Collene Gobble, MD  Potassium 75 MG TABS Take 10 mg by mouth.   Yes Historical Provider, MD  venlafaxine (EFFEXOR) 25 MG tablet Take 1 tablet (25 mg total) by mouth 2 (two) times daily with a meal. 02/17/15  Yes Collene Gobble, MD     Allergies  Allergen Reactions  . Antihistamines, Chlorpheniramine-Type Other (See Comments)    Hallucinations  . Other     pyridium  . Phenazopyridine Hives and Other (See Comments)    Pyridium. Dizziness and indigestion       Objective:  Physical Exam  Constitutional: He is oriented to person, place, and time. Vital signs are normal. He appears well-developed and well-nourished. He is active and cooperative. He appears distressed (he is obviously uncomfortable in the exam room, shifting position frequently).  BP 118/72 mmHg  Pulse 95  Temp(Src) 98.6 F (37 C) (Oral)  Resp 16  Ht  (1.778 m)  Wt 172 lb (78.019 kg)  BMI 24.68 kg/m2  SpO2 98%  HENT:  Head: Normocephalic and atraumatic.  Right Ear: Hearing normal.  Left Ear: Hearing normal.  Eyes: Conjunctivae are normal. No scleral icterus.  Neck: Normal range of motion. Neck supple. No thyromegaly present.  Cardiovascular: Normal rate, regular rhythm and normal heart sounds.   Pulses:      Radial pulses are 2+ on the right side, and 2+ on the left side.  Pulmonary/Chest: Effort normal and breath sounds normal.  Musculoskeletal:       Cervical back: Normal.       Thoracic back: Normal.        Lumbar back: He exhibits tenderness, bony tenderness and pain.  Lymphadenopathy:       Head (right side): No tonsillar, no preauricular, no posterior auricular and no occipital adenopathy present.       Head (left side): No tonsillar, no preauricular, no posterior auricular and no occipital adenopathy present.    He has no cervical adenopathy.       Right: No supraclavicular adenopathy present.       Left: No supraclavicular adenopathy present.  Neurological: He is alert and oriented to person, place, and time. He has normal strength. No sensory deficit.  SLR  POSITIVE on RIGHT (seated).  Skin: Skin is warm, dry and intact. No rash noted. No cyanosis or erythema. Nails show no clubbing.  Psychiatric: He has a normal mood and affect.           Assessment & Plan:   1. Back pain at L4-L5 level Increase MSIR to 15-30 mg Q6 hours. Increase gabapentin to 400 mg TID, and up from there if needed. Hopefully will be able to reduce MSIR back down. Refer to pain management to help with this issue until he can get the hypotension issue addressed and have the surgery. - morphine (MSIR) 15 MG tablet; Take 2 tablets (30 mg total) by mouth every 6 (six) hours as needed for severe pain.  Dispense: 120 tablet; Refill: 0 - gabapentin (NEURONTIN) 400 MG capsule; Take 1 capsule (400 mg total) by mouth 3 (three) times daily.  Dispense: 90 capsule; Refill: 1 - Ambulatory referral to Pain Clinic  2. Need for influenza vaccination - Flu Vaccine QUAD 36+ mos IM  After this patient left, realized that I had not asked about the NSAIDS on his medication list, so called and spoke with Jimmy HammedEva by phone. He takes Meloxicam 7.5 mg daily, and supplements with OTC ibuprofen. Advised that he cannot use them both. We discussed the options and elected to try the meloxicam 7.5 mg BID (he has enough to do this for 1 week). i will send in a new Rx, and they can try 7.5 mg BID and 15 mg QD to see which regimen works  best.   Fernande Brashelle S. Denaya Horn, PA-C Physician Assistant-Certified [facility]

## 2015-03-07 NOTE — Progress Notes (Signed)
Subjective:    Patient ID: Jimmy [name], male    DOB: Dec 08, 1965, 49 y.o.   MRN: [id number]  HPI Patient presents today for medication refill of morphine for chronic back related to spinal stenosis and degenerative disc disease requiring prior surgeries in 2002 and 2009. Awaiting a third surgery, but cannot proceed until BP/orthostatic hypotension is under control.    Currently takes 15 mg morphine q 6 hours without significant pain relief now. Believes the morphine is just not doing its job anymore. States his pain was previously a 5 or 6 out of 10 on the pain scale, but now it hovers around 7 or 8 out of 10. Pain is worse if he lies down for longer than an hour or walks for longer than a few minutes. Associated with occasional sciatic nerve pain in both legs. Ambulates with a walker when he is out and about. Has been on disability since 2013.    Prescription last filled by Dr. Cleta Alberts on 02/17/15. Refills previously handled by orthopaedics.   Patient also takes 800 mg ibuprofen q 6 hours (versus q 8 hours as prescribed), meloxicam 7.5 mg daily, and gabapentin 300 mg TID for his chronic pain.   Patient fell on his right hip approx. 4 months ago. Having increased swelling on the right side x 2-3 months. Denies any clicking or popping. Cannot bear weight at all on the right hip. MRI of the right hip in August was normal.   Has appointment with neurology for follow-up of orthostatic hypotension and possible cardiology referral. Started 100 mg Northera TID on 02/22/15.   Endocrinology is following patient's low cortisol levels. Next appointment scheduled for February.   Patient reports he will see Dr. Cleta Alberts later this month for further evaluation and possible pain management referral.  Patient is accompanied today by his wife, [name], who is the primary historian and handler of his medications.    Patient would like to get his annual flu vaccination today.   No other concerns on today's visit.    Review of Systems  Constitutional: Positive for activity change (increased back pain, limiting ambulation). Negative for fever and chills.  Gastrointestinal: Negative for nausea and vomiting.  Musculoskeletal: Positive for back pain (worsening) and gait problem (ambulates with a walker).  Neurological: Positive for weakness and numbness (occasional sciatic nerve pain in b/l legs).  Psychiatric/Behavioral: Positive for sleep disturbance (poor sleep; wakes up every 2 hours).   Patient Active Problem List   Diagnosis Date Noted  . Low serum cortisol level (HCC) 01/27/2015  . Hyperglycemia 01/03/2015  . Chronic pain syndrome   . Hyponatremia   . Hypotension, postural   . Syncope 12/17/2014  . Faintness   . Right hip pain   . Abnormal EKG 12/12/2014  . POTS (postural orthostatic tachycardia syndrome)   . Adrenal insufficiency (HCC)   . Groin pain   . Orthostatic hypotension   . Lower abdominal pain 12/04/2014  . Weight loss, unintentional   . Absolute anemia   . Prostatitis 12/03/2014  . Abdominal pain 12/02/2014  . Hypokalemia 12/02/2014  . Urinary retention 12/02/2014  . Prostatitis, acute   . Abdominal pain, lower   . Chronic lower back pain    Family History  Problem Relation Age of Onset  . Heart disease Maternal Grandmother   . Cancer Maternal Grandfather   . Hypertension Mother    Social History   Social History  . Marital Status: Married    Spouse Name: N/A  .  Number of Children: 4  . Years of Education: 12   Occupational History  .      disabled   Social History Main Topics  . Smoking status: Former Smoker    Quit date: 05/02/2007  . Smokeless tobacco: Never Used  . Alcohol Use: No  . Drug Use: No  . Sexual Activity: Yes   Other Topics Concern  . Not on file   Social History Narrative   Lives at home with wife, children   Caffeine use- 1 soda a day   Prior to Admission medications   Medication Sig Start Date End Date Taking? Authorizing  Provider  docusate sodium (COLACE) 100 MG capsule Take 200 mg by mouth daily.   Yes Historical Provider, MD  fludrocortisone (FLORINEF) 0.1 MG tablet Take 3 tablets (0.3 mg total) by mouth daily. 01/17/15  Yes Collene Gobble, MD  gabapentin (NEURONTIN) 300 MG capsule Take 1 capsule (300 mg total) by mouth 3 (three) times daily. 02/03/15  Yes Collene Gobble, MD  hydrocortisone (CORTEF) 5 MG tablet Take 2 tablets ( ) daily at 7am, take  daily with lunch, and take  daily at 4pm Patient taking differently: Take 5-10 mg by mouth 2 (two) times daily. Take 2 tablets in the morning and 1 at 4pm 01/17/15  Yes Collene Gobble, MD  ibuprofen (ADVIL,MOTRIN) 800 MG tablet Take 800 mg by mouth every 8 (eight) hours as needed for moderate pain.    Yes Historical Provider, MD  meloxicam (MOBIC) 7.5 MG tablet Take 7.5 mg by mouth daily.   Yes Historical Provider, MD  morphine (MSIR) 15 MG tablet Take 1 tablet (15 mg total) by mouth every 6 (six) hours as needed for severe pain. 02/17/15  Yes Collene Gobble, MD  morphine (MSIR) 15 MG tablet Take 1 tablet (15 mg total) by mouth every 6 (six) hours as needed for severe pain. 02/17/15  Yes Collene Gobble, MD  Potassium 75 MG TABS Take 10 mg by mouth.   Yes Historical Provider, MD  venlafaxine (EFFEXOR) 25 MG tablet Take 1 tablet (25 mg total) by mouth 2 (two) times daily with a meal. 02/17/15  Yes Collene Gobble, MD   Allergies  Allergen Reactions  . Antihistamines, Chlorpheniramine-Type Other (See Comments)    Hallucinations  . Other     pyridium  . Phenazopyridine Hives and Other (See Comments)    Pyridium. Dizziness and indigestion      Objective:   Physical Exam  Constitutional: He is oriented to person, place, and time. He appears well-developed and well-nourished. No distress.  BP 118/72 mmHg  Pulse 95  Temp(Src) 98.6 F (37 C) (Oral)  Resp 16  Ht  (1.778 m)  Wt 172 lb (78.019 kg)  BMI 24.68 kg/m2  SpO2 98%. Patient lying on his left side in  moderate discomfort.   Eyes: EOM are normal. No scleral icterus.  Cardiovascular: Normal rate, regular rhythm, normal heart sounds and intact distal pulses.  Exam reveals no gallop and no friction rub.   No murmur heard. Pulmonary/Chest: Effort normal and breath sounds normal. No respiratory distress. He has no wheezes. He has no rales.  Musculoskeletal: He exhibits tenderness (back and right hip).  Back tender to palpation b/l at L4-L5 level. Right lateral hip also tender to palpation. Protruding slightly more than left hip.   Neurological: He is alert and oriented to person, place, and time.  Skin: Skin is warm and dry. No erythema.  Psychiatric:  He has a normal mood and affect. His behavior is normal. Judgment and thought content normal.      Assessment & Plan:  1. Back pain at L4-L5 level - Will increase morphine to 30 mg q 6 hours and gabapentin to 400 mg TID for chronic back pain. Can increase gabapentin more if needed, but will wait to adjust morphine dose further until discussing with Dr. Cleta Albertsaub. Will make ambulatory referral to pain clinic to get the ball rolling for patient to be seen there.  - morphine (MSIR) 15 MG tablet; Take 2 tablets (30 mg total) by mouth every 6 (six) hours as needed for severe pain.  Dispense: 120 tablet; Refill: 0 - gabapentin (NEURONTIN) 400 MG capsule; Take 1 capsule (400 mg total) by mouth 3 (three) times daily.  Dispense: 90 capsule; Refill: 1 - Ambulatory referral to Pain Clinic  2. Need for influenza vaccination - Flu Vaccine QUAD 36+ mos IM

## 2015-03-07 NOTE — Telephone Encounter (Signed)
After this patient left, realized that I had not asked about the NSAIDS on his medication list.  Per his wife, he takes Meloxicam 7.5 mg daily, and supplements with OTC ibuprofen.  Advised that he cannot use them both.  We discussed the options and elected to try the meloxicam 7.5 mg BID (he has enough to do this for 1 week). i will send in a new Rx, and they can try 7.5 mg BID and 15 mg QD to see which regimen works best.  Meds ordered this encounter  Medications  . meloxicam (MOBIC) 7.5 MG tablet    Sig: Take 1 tablet (7.5 mg total) by mouth 2 (two) times daily.    Dispense:  60 tablet    Refill:  1    Order Specific Question:  Supervising Provider    Answer:  DOOLITTLE, ROBERT P [[id number]]

## 2015-03-08 ENCOUNTER — Ambulatory Visit (INDEPENDENT_AMBULATORY_CARE_PROVIDER_SITE_OTHER): Payer: Medicare Other | Admitting: Diagnostic Neuroimaging

## 2015-03-08 ENCOUNTER — Encounter: Payer: Self-pay | Admitting: Diagnostic Neuroimaging

## 2015-03-08 VITALS — Ht 70.0 in | Wt 174.0 lb

## 2015-03-08 DIAGNOSIS — I951 Orthostatic hypotension: Secondary | ICD-10-CM

## 2015-03-08 DIAGNOSIS — G909 Disorder of the autonomic nervous system, unspecified: Secondary | ICD-10-CM | POA: Diagnosis not present

## 2015-03-08 NOTE — Patient Instructions (Signed)
-   gradually increase to northera 200mg  three times per day. - keep laying flat BP < 180/110

## 2015-03-08 NOTE — Progress Notes (Signed)
[facility]  PATIENT: Jimmy [name] DOB: Jun 15, 1965  REFERRING CLINICIAN: Daub, S HISTORY FROM: patient and wife  REASON FOR VISIT: follow up    HISTORICAL  CHIEF COMPLAINT:  Chief Complaint  Patient presents with  . Orthostatic hypotension    rm 7, wife [name]  . Follow-up    1 month    HISTORY OF PRESENT ILLNESS:   UPDATE 03/08/15: Since last visit, now on northera  TID. Some supine hypertension (max SBP 183/99; but rare). Now with new onset of numbness in hands x 1 month, and numbness in toes since Aug 2016.   UPDATE 02/10/15: Since last visit, no new events. Still with orthostatic hypotension and lightheadedness and dizziness. Also struggling with severe low back pain radiating to the legs. Still on florinef.   PRIOR HPI (01/11/15): 49 year old left-handed male here for evaluation of orthostatic hypotension, dysautonomia. In June patient had left C1 herpes zoster ophthalmicus, treated with antivirals therapy. In July patient began to have intermittent episodes of hypotension, dizziness, urinary retention and ultimately leading to syncope. Patient was evaluated at the hospital 3 times including most recent admission with discharge on 12/23/2014. During this time he has been diagnosed with prostatitis, mild renal insufficiency, postural orthostatic tachycardia syndrome. He was tried on Midodrine and Florinef, and ultimately discharged on Florinef plus hydrocortisone. Had problems with urinary sxs with midodrine. Outpatient neurology follow-up is pending. For prostatitis he was discharged on anti-biotics to complete a 6 week course. He also has urology follow-up for urinary retention. Patient also set up for follow-up in my neurology clinic today. Patient still having significant problems standing and walking. He also has chronic low back pain, history of spinal stenosis status post surgery. Patient has had back surgeries in 2002 and 2009. He has chronic pain  syndrome. Is on long-term narcotic medications.   REVIEW OF SYSTEMS: Full 14 system review of systems performed and notable only for chills fatigue trouble swallowing freq waking itching confusion anxiety dizziness numbness tremors passing out.    ALLERGIES: Allergies  Allergen Reactions  . Antihistamines, Chlorpheniramine-Type Other (See Comments)    Hallucinations  . Other     pyridium  . Phenazopyridine Hives and Other (See Comments)    Pyridium. Dizziness and indigestion    HOME MEDICATIONS: Outpatient Prescriptions Prior to Visit  Medication Sig Dispense Refill  . docusate sodium (COLACE) 100 MG capsule Take 200 mg by mouth daily.    . Droxidopa 100 MG CAPS Take 100 mg by mouth 3 (three) times daily.    . fludrocortisone (FLORINEF) 0.1 MG tablet Take 3 tablets (0.3 mg total) by mouth daily. 90 tablet 11  . gabapentin (NEURONTIN) 400 MG capsule Take 1 capsule (400 mg total) by mouth 3 (three) times daily. 90 capsule 1  . hydrocortisone (CORTEF) 5 MG tablet Take 2 tablets ( ) daily at 7am, take  daily with lunch, and take  daily at 4pm (Patient taking differently: Take 5-10 mg by mouth 2 (two) times daily. Take 2 tablets in the morning and 1 at 4pm) 120 tablet 3  . meloxicam (MOBIC) 7.5 MG tablet Take 1 tablet (7.5 mg total) by mouth 2 (two) times daily. 60 tablet 1  . morphine (MSIR) 15 MG tablet Take 2 tablets (30 mg total) by mouth every 6 (six) hours as needed for severe pain. 120 tablet 0  . Potassium 75 MG TABS Take 10 mg by mouth.    . venlafaxine (EFFEXOR) 25 MG tablet Take 1 tablet (25 mg total)  by mouth 2 (two) times daily with a meal. 30 tablet 3   No facility-administered medications prior to visit.    PAST MEDICAL HISTORY: Past Medical History  Diagnosis Date  . Back pain   . DDD (degenerative disc disease), cervical   . Spinal stenosis   . Headache     migraines    PAST SURGICAL HISTORY: Past Surgical History  Procedure Laterality Date  . Back  surgery  2002,2009    x 2, lower , job injury, spinal stenosis    FAMILY HISTORY: Family History  Problem Relation Age of Onset  . Heart disease Maternal Grandmother   . Cancer Maternal Grandfather   . Hypertension Mother     SOCIAL HISTORY:  Social History   Social History  . Marital Status: Married    Spouse Name: N/A  . Number of Children: 4  . Years of Education: 12   Occupational History  .      disabled   Social History Main Topics  . Smoking status: Former Smoker    Quit date: 05/02/2007  . Smokeless tobacco: Never Used  . Alcohol Use: No  . Drug Use: No  . Sexual Activity: Yes   Other Topics Concern  . Not on file   Social History Narrative   Lives at home with wife, children   Caffeine use- 1 soda a day     PHYSICAL EXAM  GENERAL EXAM/CONSTITUTIONAL: Vitals:  Filed Vitals:   03/08/15 1337  Height:  (1.778 m)  Weight: 174 lb (78.926 kg)    Body mass index is 24.97 kg/(m^2). No exam data present  Patient is in no distress; well developed, nourished and groomed; neck is supple   CARDIOVASCULAR:  Examination of carotid arteries is normal; no carotid bruits  Regular rate and rhythm, no murmurs  Examination of peripheral vascular system by observation and palpation is normal  EYES:  Ophthalmoscopic exam of optic discs and posterior segments is normal; no papilledema or hemorrhages  MUSCULOSKELETAL:  Gait, strength, tone, movements noted in Neurologic exam below  NEUROLOGIC: MENTAL STATUS:  No flowsheet data found.  awake, alert, oriented to person, place and time  recent and remote memory intact  normal attention and concentration  language fluent, comprehension intact, naming intact,   fund of knowledge appropriate  CRANIAL NERVE:   2nd - no papilledema on fundoscopic exam  2nd, 3rd, 4th, 6th - pupils equal and reactive to light, visual fields full to confrontation, extraocular muscles intact, no nystagmus  5th -  facial sensation symmetric  7th - facial strength symmetric  8th - hearing intact  9th - palate elevates symmetrically, uvula midline  11th - shoulder shrug symmetric  12th - tongue protrusion midline  MOTOR:   normal bulk and tone; BUE 4; BLE (HF 3, KE/KF 4, DF 3)  SENSORY:   normal and symmetric to light touch, temperature, vibration; ABSENT VIB AT TOES  COORDINATION:   finger-nose-finger, fine finger movements SLOW  REFLEXES:   deep tendon reflexes TRACE and symmetric; ABSENT AT KNEE AND ANKLES  GAIT/STATION:   narrow based gait; USES A WALKER    DIAGNOSTIC DATA (LABS, IMAGING, TESTING) - I reviewed patient records, labs, notes, testing and imaging myself where available.  Lab Results  Component Value Date   WBC 4.0 02/02/2015   HGB 12.2* 02/02/2015   HCT 34.7* 02/02/2015   MCV 93.8 02/02/2015   PLT 194 02/02/2015      Component Value Date/Time   NA 133*  02/02/2015 1142   K 3.0* 02/02/2015 1142   CL 98* 02/02/2015 1142   CO2 24 02/02/2015 1142   GLUCOSE 133* 02/02/2015 1142   BUN 7 02/02/2015 1142   CREATININE 0.89 02/02/2015 1142   CREATININE 1.07 01/17/2015 1014   CALCIUM 9.3 02/02/2015 1142   PROT 6.8 02/01/2015 0633   ALBUMIN 3.7 02/01/2015 0633   AST 20 02/01/2015 0633   ALT 14* 02/01/2015 0633   ALKPHOS 74 02/01/2015 0633   BILITOT 1.1 02/01/2015 0633   GFRNONAA >60 02/02/2015 1142   GFRNONAA 82 01/17/2015 1014   GFRAA >60 02/02/2015 1142   GFRAA >89 01/17/2015 1014   No results found for: CHOL, HDL, LDLCALC, LDLDIRECT, TRIG, CHOLHDL Lab Results  Component Value Date   HGBA1C 6.5* 12/04/2014   Lab Results  Component Value Date   VITAMINB12 378 12/22/2014   Lab Results  Component Value Date   TSH 1.145 12/04/2014    12/17/14 CT head [I reviewed images myself and agree with interpretation. -VRP]  - negative   12/08/14 MRI right hip - Negative examination. There is no evidence of septic hip. No abnormality to explain the  patient's symptoms is identified.   11/30/14 MRI lumbar spine  1. Overall stable appearance of the lumbar spine with sequelae of prior decompressive laminectomy at L4-5. No recurrent stenosis at this level. 2. Congenital spinal stenosis with superimposed mild multilevel degenerative changes as above, overall relatively similar to prior MRI from 2011. No severe canal stenosis or evidence of cord compression. 3. Distended urinary bladder.  02/08/15 MRI brain  - normal  01/27/15 MRI cervical spine (with and without) demonstrating: 1. At C4-5: leftward disc bulging with severe left foraminal stenosis 2. At C5-6: uncovertebral joint hypertrophy with moderate left foraminal stenosis  3. At C6-7: uncovertebral joint hypertrophy and facet hypertrophy with mild right and moderate left foraminal stenosis  4. No intrinsic spinal cord lesions.  01/27/15 MRI thoracic spine (with and without) demonstrating: 1. At T3-4: right uncovertebral joint hypertrophy with moderate right foraminal stenosis  2. At T8-9: disc bulging with no spinal stenosis or foraminal narrowing  3. No intrinsic or compressive spinal cord lesions.     ASSESSMENT AND PLAN  49 y.o. year old male here with history of lumbar spinal stenosis, status post decompression surgery, chronic pain, now with progressive medical and neurologic decline since June 2016. Patient had left V1 herpes zoster ophthalmicus in June 2016 followed by progressive urinary retention, dizziness and now orthostatic intolerance/hypotension. 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 Ambulatory referral to Neurology  . NCV with EMG(electromyography)   Return in about 3 months (around 06/08/2015).    Suanne MarkerVIKRAM R. Cana Mignano, MD 03/08/2015, 2:30 PM Certified in Neurology, Neurophysiology and Neuroimaging  [facility] [address], [address] [place], [place] [address] [phone]

## 2015-03-09 DIAGNOSIS — E876 Hypokalemia: Secondary | ICD-10-CM | POA: Diagnosis not present

## 2015-03-16 ENCOUNTER — Telehealth: Payer: Self-pay | Admitting: Diagnostic Neuroimaging

## 2015-03-16 NOTE — Telephone Encounter (Signed)
[facility] does not have Autonomic disorder specialist at this time. [facility] and [facility] [place] and she relayed to me she she will have to give [facility] Ringsachel Haggin. Shana relayed to me she will call me back with possible apt for patient. Telephone number [phone] fax [phone].  I have called and spoke to patient he is aware of status.

## 2015-03-16 NOTE — Telephone Encounter (Signed)
Tiffany with [facility] called regarding referral to the Neurology dept. Please call them back to discuss [phone]. Please leave message if she does not answer.

## 2015-03-17 ENCOUNTER — Encounter: Payer: Self-pay | Admitting: Cardiovascular Disease

## 2015-03-17 ENCOUNTER — Ambulatory Visit (INDEPENDENT_AMBULATORY_CARE_PROVIDER_SITE_OTHER): Payer: Medicare Other | Admitting: Cardiovascular Disease

## 2015-03-17 VITALS — BP 107/76 | HR 94 | Ht 71.0 in | Wt 171.4 lb

## 2015-03-17 DIAGNOSIS — R0602 Shortness of breath: Secondary | ICD-10-CM

## 2015-03-17 DIAGNOSIS — R55 Syncope and collapse: Secondary | ICD-10-CM

## 2015-03-17 DIAGNOSIS — R634 Abnormal weight loss: Secondary | ICD-10-CM

## 2015-03-17 DIAGNOSIS — Z79899 Other long term (current) drug therapy: Secondary | ICD-10-CM

## 2015-03-17 DIAGNOSIS — I951 Orthostatic hypotension: Secondary | ICD-10-CM

## 2015-03-17 MED ORDER — FLUDROCORTISONE ACETATE 0.1 MG PO TABS: 0.4000 mg | ORAL_TABLET | Freq: Every day | ORAL | Status: DC

## 2015-03-17 NOTE — Telephone Encounter (Signed)
Called Tiffany back and left her a message to call me back.

## 2015-03-17 NOTE — Patient Instructions (Signed)
Medication Instructions:  INCREASE Florinef to 0.4 mg daily - take 4 tablets by mouth daily.  Labwork: Your physician recommends that you return for lab work in 1 week.  Testing/Procedures: Dr Duke Salviaandolph recommeds that you have a CT of the chest done. [facility]. The address is [address], [address].  Follow-Up: Dr Duke Salviaandolph recommends that you schedule a follow-up appointment in 6 months. You will receive a reminder letter in the mail two months in advance. If you don't receive a letter, please call our office to schedule the follow-up appointment.  If you need a refill on your cardiac medications before your next appointment, please call your pharmacy.

## 2015-03-17 NOTE — Progress Notes (Signed)
Cardiology Office Note   Date:  03/17/2015   ID:  Jimmy [name], DOB 03/27/1966, MRN [id number]  PCP:  Jenny Reichmann, MD  Cardiologist:   Sharol Harness, MD   Chief Complaint  Patient presents with  . New Evaluation    pt states no chest pain, no edema, some pain in right hip all the way down his leg  . Shortness of Breath    only when exertion and when he gets dizzy      History of Present Illness: Jimmy [name] is a 49 y.o. male adrenal insufficiency, herpes zoster opthalmicus, and syncope who presents for management of orthostatic hypotension and syncope.  Jimmy [name] has been struggling with syncope since July. In June he had an episode of herpes zoster ophthalmicus. Starting in July he's had recurrent episodes of profound orthostasis. He has severe lightheadedness upon standing that is worse when he stands still then if he is walking. He is unable to stand for more than a few moments at a time without passing out. He's been admitted to the hospital 3 times, most recently 12/23/14.  At that hospitalization he was diagnosed with prostatitis, mild renal insufficiency, and POTS.  He was started on midodrine and florinef.  Midodrine was discontinued due to urinary retention.  He was discharged on Florinef, hydrocortisone and Northera.  Jimmy [name] continues to be under the care of his neurologist Neurologist, Dr. Andrey Spearman.  He has struggled with supine hypertension and middle drain was discontinued.  At his follow-up appointment with Dr. Leta Baptist on 11/7 the Droxidopa was increased to 100 mg 4 times daily.  Jimmy [name] has tried increasing his fluid intake, increasing his salt intake, and wearing compression stockings. Despite this he continues to have episodes of syncope. He does not think that any of the medications are helping.  If he is able to lay down quick enough once the dizziness starts he can sometimes abate a full syncopal episode. However, sometimes he has  episodes of syncope even after laying down. He is also struggled from profound anorexia. He was 250 pounds in June and is now 171 pounds. He reports going for days at a time without eating and only drinking. He was previously on Reglan for gastroparesis, as when he does eat he often feels nauseous. This helped somewhat but was discontinued by his gastroenterologist upon follow-up.  Jimmy [name] has a strong family history of malignancy. His mother had pancreatic cancer, is on had breast cancer in his grandfather had prostate cancer. He denies any melena or hematochezia.  He endorses numbness in both feet and the fourth and fifth digits on each hand.  He has spinal stenosis and chronic low back pain. He had back surgeries in 2002 and 2009. He denies chest pain, shortness of breath, lower extremity edema, orthopnea, PND, or palpitations.    Past Medical History  Diagnosis Date  . Back pain   . DDD (degenerative disc disease), cervical   . Spinal stenosis   . Headache     migraines    Past Surgical History  Procedure Laterality Date  . Back surgery  2002,2009    x 2, lower , job injury, spinal stenosis     Current Outpatient Prescriptions  Medication Sig Dispense Refill  . docusate sodium (COLACE) 100 MG capsule Take 200 mg by mouth daily.    . Droxidopa 100 MG CAPS Take 100 mg by mouth 4 (four) times daily.     . fludrocortisone (FLORINEF)  0.1 MG tablet Take 4 tablets (0.4 mg total) by mouth daily. 120 tablet 11  . gabapentin (NEURONTIN) 400 MG capsule Take 1 capsule (400 mg total) by mouth 3 (three) times daily. 90 capsule 1  . hydrocortisone (CORTEF) 5 MG tablet Take 2 tablets (70m) daily at 7am, take 568mdaily with lunch, and take 28m24maily at 4pm (Patient taking differently: Take 5-10 mg by mouth 2 (two) times daily. Take 2 tablets in the morning and 1 at 4pm) 120 tablet 3  . meloxicam (MOBIC) 7.5 MG tablet Take 1 tablet (7.5 mg total) by mouth 2 (two) times daily. 60 tablet 1  .  morphine (MSIR) 15 MG tablet Take 2 tablets (30 mg total) by mouth every 6 (six) hours as needed for severe pain. 120 tablet 0  . venlafaxine (EFFEXOR) 25 MG tablet Take 1 tablet (25 mg total) by mouth 2 (two) times daily with a meal. 30 tablet 3   No current facility-administered medications for this visit.    Allergies:   Antihistamines, chlorpheniramine-type; Other; and Phenazopyridine    Social History:  The patient  reports that he quit smoking about 7 years ago. He has never used smokeless tobacco. He reports that he does not drink alcohol or use illicit drugs.   Family History:  The patient's family history includes Cancer in his maternal grandfather and mother; Heart disease in his maternal grandmother; Hyperlipidemia in his maternal grandmother; Hypertension in his maternal grandfather, maternal grandmother, and mother.    ROS:  Please see the history of present illness.   Otherwise, review of systems are positive for none.   All other systems are reviewed and negative.    PHYSICAL EXAM: VS:  BP 107/76 mmHg  Pulse 94  Ht 5' 11"  (1.803 m)  Wt 77.747 kg (171 lb 6.4 oz)  BMI 23.92 kg/m2 , BMI Body mass index is 23.92 kg/(m^2).  Lying- 147/91 HR 76; Sitting 124/81 HR 84; Standing 89/65 HR 102  Unable to obtain 3 minute standing vitals as Mr. HouCoggeshallmost had a recurrent episode of syncope. GENERAL:  Chronically ill-appearing.  Appears weak. HEENT:  Pupils equal round and reactive, fundi not visualized, oral mucosa unremarkable NECK:  No jugular venous distention, waveform within normal limits, carotid upstroke brisk and symmetric, no bruits, no thyromegaly LYMPHATICS:  No cervical adenopathy LUNGS:  Clear to auscultation bilaterally HEART:  RRR.  PMI not displaced or sustained,S1 and S2 within normal limits, no S3, no S4, no clicks, no rubs, no murmurs ABD:  Flat, positive bowel sounds normal in frequency in pitch, no bruits, no rebound, no guarding, no midline pulsatile mass, no  hepatomegaly, no splenomegaly EXT:  2 plus pulses throughout, no edema, no cyanosis no clubbing SKIN:  No rashes no nodules NEURO:  Cranial nerves II through XII grossly intact, motor grossly intact throughout PSYCH:  Cognitively intact, oriented to person place and time   EKG:  EKG is not ordered today.   Echo 12/13/14: Study Conclusions  - Left ventricle: The cavity size was normal. Wall thickness was normal. Systolic function was normal. The estimated ejection fraction was in the range of 60% to 65%. Wall motion was normal; there were no regional wall motion abnormalities.  Recent Labs: 12/04/2014: TSH 1.145 02/01/2015: ALT 14* 02/02/2015: BUN 7; Creatinine, Ser 0.89; Hemoglobin 12.2*; Platelets 194; Potassium 3.0*; Sodium 133*    Lipid Panel No results found for: CHOL, TRIG, HDL, CHOLHDL, VLDL, LDLCALC, LDLDIRECT    Wt Readings from Last 3 Encounters:  03/17/15 77.747 kg (171 lb 6.4 oz)  03/08/15 78.926 kg (174 lb)  03/07/15 78.019 kg (172 lb)      ASSESSMENT AND PLAN:  # Orthostatic hypotension: Jimmy [name] has severe orthostatic hypotension that began this summer after the herpes zoster reactivation. In the office today his blood pressure dropped profoundly after standing for a short period of time. While his heart rate did increase somewhat, there was not a drastic increase in heart rate. This is suggestive of autonomic dysfunction.  We will obtain a CT of the chest to rule out malignancy with paraneoplastic syndrome. He already had an MRI and CT of the brain as well as CT of the abdomen and pelvis.  His hemoglobin A1c was 6.5%. Although diabetes can cause an autonomic neuropathy, this is unlikely with a hemoglobin A1c of 6.5.  HIV testing was negative.  He has been tested for vitamin B12, sarcoidosis, and multiple myeloma.  Although SSRIs can cause orthostatic hypotension, SNRI's are less likely to do so.  In fact, Effexor has hypertension listed as a side effect. -  Increase florinef to 0.4 mg - Repeat BMP in one week - Check CT chest for malignancy - stressed the importance of compression stockings, fluids, hydration - consider checking ganglionic nicotinic acetylcholine receptor (nAChR) autoantibodies for autoimmune autonomic dysfunction - Follow up with Endocrine for management of adrenal insufficiency.  I spent 45 minutes of face to fact time with Jimmy [name]   Current medicines are reviewed at length with the patient today.  The patient does not have concerns regarding medicines.  The following changes have been made:  Increase florinef as above.  Labs/ tests ordered today include:   Orders Placed This Encounter  Procedures  . CT Chest Wo Contrast  . Basic metabolic panel     Disposition:   FU with Jimmy [name] C. Oval Linsey, MD in 6 months.    Signed, Sharol Harness, MD  03/17/2015 7:20 PM    [place]

## 2015-03-18 ENCOUNTER — Ambulatory Visit (INDEPENDENT_AMBULATORY_CARE_PROVIDER_SITE_OTHER)
Admission: RE | Admit: 2015-03-18 | Discharge: 2015-03-18 | Disposition: A | Discharge status: Home / Self Care | Payer: Medicare Other | Source: Ambulatory Visit | Attending: Cardiovascular Disease | Admitting: Cardiovascular Disease

## 2015-03-18 DIAGNOSIS — I2510 Atherosclerotic heart disease of native coronary artery without angina pectoris: Secondary | ICD-10-CM | POA: Diagnosis not present

## 2015-03-18 DIAGNOSIS — R634 Abnormal weight loss: Secondary | ICD-10-CM

## 2015-03-18 DIAGNOSIS — R55 Syncope and collapse: Secondary | ICD-10-CM | POA: Diagnosis not present

## 2015-03-18 DIAGNOSIS — I951 Orthostatic hypotension: Secondary | ICD-10-CM | POA: Diagnosis not present

## 2015-03-18 DIAGNOSIS — R0602 Shortness of breath: Secondary | ICD-10-CM | POA: Diagnosis not present

## 2015-03-22 ENCOUNTER — Telehealth: Payer: Self-pay | Admitting: Diagnostic Neuroimaging

## 2015-03-22 ENCOUNTER — Ambulatory Visit (INDEPENDENT_AMBULATORY_CARE_PROVIDER_SITE_OTHER): Payer: Medicare Other | Admitting: Emergency Medicine

## 2015-03-22 VITALS — BP 88/56 | HR 121 | Temp 98.1°F | Resp 16 | Wt 168.0 lb

## 2015-03-22 DIAGNOSIS — M545 Low back pain, unspecified: Secondary | ICD-10-CM

## 2015-03-22 DIAGNOSIS — R739 Hyperglycemia, unspecified: Secondary | ICD-10-CM

## 2015-03-22 DIAGNOSIS — G909 Disorder of the autonomic nervous system, unspecified: Secondary | ICD-10-CM | POA: Diagnosis not present

## 2015-03-22 DIAGNOSIS — I951 Orthostatic hypotension: Secondary | ICD-10-CM

## 2015-03-22 DIAGNOSIS — R634 Abnormal weight loss: Secondary | ICD-10-CM

## 2015-03-22 LAB — BASIC METABOLIC PANEL WITH GFR
BUN: 15 mg/dL (ref 7–25)
CO2: 27 mmol/L (ref 20–31)
Calcium: 9.2 mg/dL (ref 8.6–10.3)
Chloride: 103 mmol/L (ref 98–110)
Creat: 0.76 mg/dL (ref 0.60–1.35)
GFR, Est African American: >89 mL/min (ref >=60)
GFR, Est Non African American: >89 mL/min (ref >=60)
Glucose, Bld: 97 mg/dL (ref 65–99)
Potassium: 4.1 mmol/L (ref 3.5–5.3)
Sodium: 139 mmol/L (ref 135–146)

## 2015-03-22 LAB — GLUCOSE, POCT (MANUAL RESULT ENTRY): POC Glucose: 99 mg/dl (ref 70–99)

## 2015-03-22 LAB — HEMOGLOBIN A1C: Hgb A1c MFr Bld: 4.9 % (ref 4.0–6.0)

## 2015-03-22 LAB — POCT GLYCOSYLATED HEMOGLOBIN (HGB A1C): Hemoglobin A1C: 4.9

## 2015-03-22 MED ORDER — MORPHINE SULFATE 15 MG PO TABS: 30.0000 mg | ORAL_TABLET | Freq: Four times a day (QID) | ORAL | Status: DC | PRN

## 2015-03-22 NOTE — Patient Instructions (Signed)

## 2015-03-22 NOTE — Telephone Encounter (Signed)
Probably best to discuss with Dr. Marjory LiesPenumalli when he is back. They could, in the meantime discuss again with cardiologist and follow her recs first.

## 2015-03-22 NOTE — Telephone Encounter (Signed)
Called and spoke w/ pt wife (ok per DPR). When they talked last with Dr Marjory LiesPenumalli last, he talked about increasing Droxidopa. He takes 4 times per day. Wants to try and increase it to 5 times daily. Saw the cardiologist  and she wanted to increase flurinef to 0.4 daily first before increasing the Droxidopa. They also went and saw PCP. PCP wanted them to contact us to get okay to increase to 5 times daily. Advised Dr Marjory LiesPenumalli out this week, and I will send message to the work in doctor, Dr Frances FurbishAthar and call her back tomorrow. She verbalized understanding.

## 2015-03-22 NOTE — Progress Notes (Signed)
This chart was scribed for Lesle ChrisSteven Shirly Bartosiewicz, MD by Andrew Auaven Small, ED Scribe. This patient was seen in room 2 and the patient's care was started at 10:06 AM.  Chief Complaint:  Chief Complaint  Patient presents with  . Dizziness    HPI: Jimmy [name] is a 49 y.o. male who [facility] complaining of dizziness. Pt was seen be cardiologist 11/16 for orthostatic hypotension and was suggested of autonomic dysfunction. This diagnosis was also suggested by neurology who he was seen by 11/07. Cardiologist, Dr. Duke Salviaandolph, increased florinef to 0.4mg  a day. He has an EMG scheduled 12/22. F/u with neurology For the past week patients BP has been low and dizziness has worsened. Systolic pressure has been 70-80. Pt has been eating and drinking.    He's also had right back pain that radiates down to his right leg for 4-5 days which he rates about 5-6/10.  He has hx of chronic back pain related to spinal stenosis and DDD with surgeries in 2002 and 2009. He has not been seen by pain management yet. Pt would like a refill of morphine. Last refill was by Porfirio Oarhelle Jeffery 11/06, who also increase morphine to 30 mg q 6 hours and gabapentin 400 mg TID.   Past Medical History  Diagnosis Date  . Back pain   . DDD (degenerative disc disease), cervical   . Spinal stenosis   . Headache     migraines   Past Surgical History  Procedure Laterality Date  . Back surgery  2002,2009    x 2, lower , job injury, spinal stenosis   Social History   Social History  . Marital Status: Married    Spouse Name: N/A  . Number of Children: 4  . Years of Education: 12   Occupational History  .      disabled   Social History Main Topics  . Smoking status: Former Smoker    Quit date: 05/02/2007  . Smokeless tobacco: Never Used  . Alcohol Use: No  . Drug Use: No  . Sexual Activity: Yes   Other Topics Concern  . None   Social History Narrative   Lives at home with wife, children   Caffeine use- 1 soda a day           Epworth Sleepiness Scale = 4 (as of 03/17/15)   Family History  Problem Relation Age of Onset  . Heart disease Maternal Grandmother   . Hypertension Maternal Grandmother   . Hyperlipidemia Maternal Grandmother   . Cancer Maternal Grandfather   . Hypertension Maternal Grandfather   . Hypertension Mother   . Cancer Mother    Allergies  Allergen Reactions  . Antihistamines, Chlorpheniramine-Type Other (See Comments)    Hallucinations  . Other     pyridium  . Phenazopyridine Hives and Other (See Comments)    Pyridium. Dizziness and indigestion   Prior to Admission medications   Medication Sig Start Date End Date Taking? Authorizing Provider  docusate sodium (COLACE) 100 MG capsule Take 200 mg by mouth daily.   Yes Historical Provider, MD  Droxidopa 100 MG CAPS Take 100 mg by mouth 4 (four) times daily.  02/22/15  Yes Suanne MarkerVikram R Penumalli, MD  fludrocortisone (FLORINEF) 0.1 MG tablet Take 4 tablets (0.4 mg total) by mouth daily. 03/17/15  Yes Chilton Siiffany [place], MD  gabapentin (NEURONTIN) 400 MG capsule Take 1 capsule (400 mg total) by mouth 3 (three) times daily. 03/07/15  Yes Chelle Jeffery, PA-C  hydrocortisone (  CORTEF) 5 MG tablet Take 2 tablets ( ) daily at 7am, take  daily with lunch, and take  daily at 4pm Patient taking differently: Take 5-10 mg by mouth 2 (two) times daily. Take 2 tablets in the morning and 1 at 4pm 01/17/15  Yes Collene Gobble, MD  meloxicam (MOBIC) 7.5 MG tablet Take 1 tablet (7.5 mg total) by mouth 2 (two) times daily. 03/07/15  Yes Chelle Jeffery, PA-C  morphine (MSIR) 15 MG tablet Take 2 tablets (30 mg total) by mouth every 6 (six) hours as needed for severe pain. 03/07/15  Yes Chelle Jeffery, PA-C  venlafaxine (EFFEXOR) 25 MG tablet Take 1 tablet (25 mg total) by mouth 2 (two) times daily with a meal. 02/17/15  Yes Collene Gobble, MD     ROS: The patient denies fevers, chills, night sweats, unintentional weight loss, chest pain, palpitations,  wheezing, dyspnea on exertion, nausea, vomiting, abdominal pain, dysuria, hematuria, melena, numbness, weakness, or tingling.   All other systems have been reviewed and were otherwise negative with the exception of those mentioned in the HPI and as above.    PHYSICAL EXAM: Filed Vitals:   03/22/15 1004 03/22/15 1005  BP: 100/65 88/56  Pulse: 111 121  Temp:    Resp:     Body mass index is 23.44 kg/(m^2).   General: Chronic ill appearing male. No distress. Eyes appear sunken. HEENT:  Normocephalic, atraumatic, oropharynx patent. Eye: Jimmy [name] [facility] Cardiovascular:  Regular rate and rhythm, no rubs murmurs or gallops.  No Carotid bruits, radial pulse intact. No pedal edema.  Respiratory: Clear to auscultation bilaterally.  No wheezes, rales, or rhonchi.  No cyanosis, no use of accessory musculature Abdominal: No organomegaly, abdomen is soft and non-tender, positive bowel sounds.  No masses. Musculoskeletal: Gait intact. No edema, tenderness Skin: No rashes. Neurologic: Facial musculature symmetric. Psychiatric: Patient acts appropriately throughout our interaction. Lymphatic: No cervical or submandibular lymphadenopathy   LABS:   ASSESSMENT/PLAN: Patient given 1 L IV fluids. They will contact Dr. Dwyane Luo office about a possible increase on droxidopa  Routine labs were done today.I personally performed the services described in this documentation, which was scribed in my presence. The recorded information has been reviewed and is accurate.   Gross sideeffects, risk and benefits, and alternatives of medications d/w patient. Patient is aware that all medications have potential sideeffects and we are unable to predict every sideeffect or drug-drug interaction that may occur.  [name] MD 03/22/2015 10:05 AM

## 2015-03-22 NOTE — Telephone Encounter (Signed)
Patient needs to go to [facility] . [phone].

## 2015-03-22 NOTE — Telephone Encounter (Signed)
[facility], Vascular, called about pts referral. They are calling to set up an appt. For the pt. Please call [phone], Jimmy MuirJamie.

## 2015-03-22 NOTE — Telephone Encounter (Signed)
Called and spoke to [name] in [facility] [name] stated she would as Advice workerurse Mgr. And call me back.

## 2015-03-22 NOTE — Telephone Encounter (Signed)
[facility] Next Nov. 30 th at 9:30. [phone]. Will relay details to patient . I have spoke to patient and his wife about apt. With Dr. Shawnee KnappLevy.  Patient  Has questions about his medications. Wylie HailMary claire -RN please call patient.

## 2015-03-23 ENCOUNTER — Telehealth: Payer: Self-pay | Admitting: *Deleted

## 2015-03-23 NOTE — Telephone Encounter (Signed)
Pt's wife called to speak with nurse. Please call back when available.

## 2015-03-23 NOTE — Telephone Encounter (Signed)
Spoke to patient. Result given . Verbalized understanding  

## 2015-03-23 NOTE — Telephone Encounter (Signed)
He should continue increased florinef dose for at least 2-3 weeks. If BP still low, then may increase droxidopa up to 500mg  three times per day. They should continue monitoring BP (laying and standing). Keep supine BP < 180/110.

## 2015-03-23 NOTE — Telephone Encounter (Signed)
Called pt wife back. Relayed Dr Marjory LiesPenumalli message below. Pt wife verbalized understanding and appreciation. Will call with any further questions.

## 2015-03-23 NOTE — Telephone Encounter (Signed)
-----   Message from Chilton Siiffany Everest, MD sent at 03/20/2015  6:54 AM EST ----- Mild calcifications of the coronary arteries and the aorta.  No evidence of cancer.

## 2015-03-23 NOTE — Telephone Encounter (Signed)
Called and spole w/ pt wife. Advised per Dr Frances FurbishAthar they should f/u w/ Dr Marjory LiesPenumalli when he gets back. In the mean time, discuss again w/ cardiologist and follow her recommendations. I will forward message to Dr Marjory LiesPenumalli for him to f/u with them when he gets back. She verbalized understanding.

## 2015-03-23 NOTE — Telephone Encounter (Signed)
[facility] Gave [facility] phone number and hours.

## 2015-03-23 NOTE — Telephone Encounter (Signed)
error 

## 2015-03-29 ENCOUNTER — Encounter: Payer: Self-pay | Admitting: Emergency Medicine

## 2015-03-31 ENCOUNTER — Encounter: Payer: Self-pay | Admitting: Family Medicine

## 2015-03-31 DIAGNOSIS — G909 Disorder of the autonomic nervous system, unspecified: Secondary | ICD-10-CM | POA: Diagnosis not present

## 2015-03-31 DIAGNOSIS — R55 Syncope and collapse: Secondary | ICD-10-CM | POA: Diagnosis not present

## 2015-03-31 DIAGNOSIS — I951 Orthostatic hypotension: Secondary | ICD-10-CM | POA: Diagnosis not present

## 2015-04-05 ENCOUNTER — Ambulatory Visit (INDEPENDENT_AMBULATORY_CARE_PROVIDER_SITE_OTHER): Payer: Medicare Other | Admitting: Emergency Medicine

## 2015-04-05 VITALS — BP 120/68 | HR 95 | Temp 98.6°F | Resp 16 | Ht 71.0 in | Wt 167.4 lb

## 2015-04-05 DIAGNOSIS — M545 Low back pain, unspecified: Secondary | ICD-10-CM

## 2015-04-05 MED ORDER — MIDODRINE HCL 2.5 MG PO TABS: ORAL_TABLET | ORAL | Status: DC

## 2015-04-05 MED ORDER — MORPHINE SULFATE 15 MG PO TABS: 30.0000 mg | ORAL_TABLET | Freq: Four times a day (QID) | ORAL | Status: DC | PRN

## 2015-04-05 NOTE — Progress Notes (Signed)
This chart was scribed for Jimmy ChrisSteven Yuna Pizzolato, MD by Stann Oresung-Kai Tsai, Medical Scribe. This patient was seen in Room 1 and the patient's care was started 12:48 PM.  Chief Complaint:  Chief Complaint  Patient presents with  . Follow-up    dr. Cleta Albertsdaub, dizzness    HPI: Jimmy [name] is a 49 y.o. male who [facility] for follow up for dizziness.  [facility] after the discussion. His pain is currently tolerable.   He also needs prescription for midodrine.  He also needs another referral to pain management because the patient states that he still hasn't heard anything from pain management.   Past Medical History  Diagnosis Date  . Back pain   . DDD (degenerative disc disease), cervical   . Spinal stenosis   . Headache     migraines   Past Surgical History  Procedure Laterality Date  . Back surgery  2002,2009    x 2, lower , job injury, spinal stenosis   Social History   Social History  . Marital Status: Married    Spouse Name: N/A  . Number of Children: 4  . Years of Education: 12   Occupational History  .      disabled   Social History Main Topics  . Smoking status: Former Smoker    Quit date: 05/02/2007  . Smokeless tobacco: Never Used  . Alcohol Use: No  . Drug Use: No  . Sexual Activity: Yes   Other Topics Concern  . None   Social History Narrative   Lives at home with wife, children   Caffeine use- 1 soda a day         Epworth Sleepiness Scale = 4 (as of 03/17/15)   Family History  Problem Relation Age of Onset  . Heart disease Maternal Grandmother   . Hypertension Maternal Grandmother   . Hyperlipidemia Maternal Grandmother   . Cancer Maternal Grandfather   . Hypertension Maternal Grandfather   . Hypertension Mother   . Cancer Mother    Allergies  Allergen Reactions  . Antihistamines, Chlorpheniramine-Type Other (See Comments)    Hallucinations  . Other     pyridium  . Phenazopyridine Hives and Other (See Comments)      Pyridium. Dizziness and indigestion   Prior to Admission medications   Medication Sig Start Date End Date Taking? Authorizing Provider  docusate sodium (COLACE) 100 MG capsule Take 200 mg by mouth daily.   Yes Historical Provider, MD  Droxidopa 100 MG CAPS Take 100 mg by mouth 4 (four) times daily.  02/22/15  Yes Suanne MarkerVikram R Penumalli, MD  fludrocortisone (FLORINEF) 0.1 MG tablet Take 4 tablets (0.4 mg total) by mouth daily. 03/17/15  Yes Chilton Siiffany [place], MD  gabapentin (NEURONTIN) 400 MG capsule Take 1 capsule (400 mg total) by mouth 3 (three) times daily. 03/07/15  Yes Chelle Jeffery, PA-C  hydrocortisone (CORTEF) 5 MG tablet Take 2 tablets (10mg ) daily at 7am, take 5mg  daily with lunch, and take 5mg  daily at 4pm Patient taking differently: Take 5-10 mg by mouth 2 (two) times daily. Take 2 tablets in the morning and 1 at 4pm 01/17/15  Yes Collene GobbleSteven A Jeremy Ditullio, MD  meloxicam (MOBIC) 7.5 MG tablet Take 1 tablet (7.5 mg total) by mouth 2 (two) times daily. 03/07/15  Yes Chelle Jeffery, PA-C  morphine (MSIR) 15 MG tablet Take 2 tablets (30 mg total) by mouth every 6 (six) hours as needed for severe pain. 03/22/15  Yes Collene Gobble, MD  venlafaxine (EFFEXOR) 25 MG tablet Take 1 tablet (25 mg total) by mouth 2 (two) times daily with a meal. 02/17/15  Yes Collene Gobble, MD     ROS:  Constitutional: negative for fever, chills, night sweats, weight changes, or fatigue  HEENT: negative for vision changes, hearing loss, congestion, rhinorrhea, ST, epistaxis, or sinus pressure Cardiovascular: negative for chest pain or palpitations Respiratory: negative for hemoptysis, wheezing, shortness of breath, or cough Abdominal: negative for abdominal pain, nausea, vomiting, diarrhea, or constipation Dermatological: negative for rash Neurologic: negative for headache, dizziness, or syncope All other systems reviewed and are otherwise negative with the exception to those above and in the HPI.  PHYSICAL EXAM: Filed  Vitals:   04/05/15 1219  BP: 120/68  Pulse: 95  Temp: 98.6 F (37 C)  Resp: 16   Body mass index is 23.36 kg/(m^2).   General: Chronically ill appearing, not in distress HEENT:  Normocephalic, atraumatic, oropharynx patent. Eye: [facility] Cardiovascular:  Regular rate and rhythm, no rubs murmurs or gallops.  No Carotid bruits, radial pulse intact. No pedal edema.  Respiratory: Clear to auscultation bilaterally.  No wheezes, rales, or rhonchi.  No cyanosis, no use of accessory musculature Abdominal: No organomegaly, abdomen is soft and non-tender, positive bowel sounds. No masses. Musculoskeletal: Gait intact utilizing a walker, No edema, tenderness Skin: No rashes. Neurologic: Facial musculature symmetric. Psychiatric: Patient acts appropriately throughout our interaction.  Lymphatic: No cervical or submandibular lymphadenopathy Genitourinary/Anorectal: No acute findings  Meds ordered this encounter  Medications  . midodrine (PROAMATINE) 2.5 MG tablet    Sig: Take 1 tablet 30 minutes before leaving home or before physical activity at home once daily    Dispense:  30 tablet    Refill:  5  . morphine (MSIR) 15 MG tablet    Sig: Take 2 tablets (30 mg total) by mouth every 6 (six) hours as needed for severe pain.    Dispense:  120 tablet    Refill:  0   LABS:   EKG/XRAY:   Primary read interpreted by Dr. Cleta Alberts at [facility].   ASSESSMENT/PLAN: I did go ahead and restart him on Midrin to take 2.5 mg when he is leaving the house or when he is going to be physically active. His morphine was also refilled. Referral put back in for pain management.I personally performed the services described in this documentation, which was scribed in my presence. The recorded information has been reviewed and is accurate.  By signing my name below, I, Stann Ore, attest that this documentation has been prepared under the direction and in the presence of Jimmy Chris, MD. Electronically Signed:  Stann Ore, Scribe. 04/05/2015 , 12:48 PM .   Gross sideeffects, risk and benefits, and alternatives of medications d/w patient. Patient is aware that all medications have potential sideeffects and we are unable to predict every sideeffect or drug-drug interaction that may occur.  Jimmy Chris MD 04/05/2015 12:48 PM

## 2015-04-20 ENCOUNTER — Telehealth: Payer: Self-pay | Admitting: *Deleted

## 2015-04-21 ENCOUNTER — Ambulatory Visit (INDEPENDENT_AMBULATORY_CARE_PROVIDER_SITE_OTHER): Payer: Medicare Other | Admitting: Emergency Medicine

## 2015-04-21 VITALS — BP 151/80 | HR 90 | Temp 97.8°F | Resp 18 | Wt 170.6 lb

## 2015-04-21 DIAGNOSIS — I951 Orthostatic hypotension: Secondary | ICD-10-CM | POA: Diagnosis not present

## 2015-04-21 DIAGNOSIS — G909 Disorder of the autonomic nervous system, unspecified: Secondary | ICD-10-CM

## 2015-04-21 DIAGNOSIS — R634 Abnormal weight loss: Secondary | ICD-10-CM

## 2015-04-21 DIAGNOSIS — M545 Low back pain, unspecified: Secondary | ICD-10-CM

## 2015-04-21 MED ORDER — MORPHINE SULFATE 15 MG PO TABS: 30.0000 mg | ORAL_TABLET | Freq: Four times a day (QID) | ORAL | Status: DC | PRN

## 2015-04-21 NOTE — Progress Notes (Addendum)
Patient ID: Jimmy [name], male   DOB: Jun 04, 1965, 49 y.o.   MRN: [id number]    By signing my name below, I, Jimmy [name], attest that this documentation has been prepared under the direction and in the presence of Collene Gobble, MD Electronically Signed: Charline Bills, ED Scribe 04/21/2015 at 2:27 PM.  Chief Complaint:  Chief Complaint  Patient presents with  . Follow-up    with Dr. Cleta Alberts    HPI: Jimmy [name] is a 49 y.o. male, with a h/o DDD and spinal stenosis, who reports to [facility] today for a follow-up. Pt was seen on 04/05/15 for back pain which he states has worsened since he was seen. Pt tried midodrine but reports secondary urinary retention after 1 low dose. He reports gradually improving urinary symptoms since stopping the medication.   Pain Management Pt has not yet heard from pain management. He has had 2 back surgeries in 2002 and 2009 by Dr. Shelle Iron.   Wt Readings from Last 3 Encounters:  04/21/15 170 lb 9.6 oz (77.384 kg)  04/05/15 167 lb 6.4 oz (75.932 kg)  03/22/15 168 lb (76.204 kg)   Past Medical History  Diagnosis Date  . Back pain   . DDD (degenerative disc disease), cervical   . Spinal stenosis   . Headache     migraines   Past Surgical History  Procedure Laterality Date  . Back surgery  2002,2009    x 2, lower , job injury, spinal stenosis   Social History   Social History  . Marital Status: Married    Spouse Name: N/A  . Number of Children: 4  . Years of Education: 12   Occupational History  .      disabled   Social History Main Topics  . Smoking status: Former Smoker    Quit date: 05/02/2007  . Smokeless tobacco: Never Used  . Alcohol Use: No  . Drug Use: No  . Sexual Activity: Yes   Other Topics Concern  . None   Social History Narrative   Lives at home with wife, children   Caffeine use- 1 soda a day         Epworth Sleepiness Scale = 4 (as of 03/17/15)   Family History  Problem Relation Age of Onset  . Heart disease  Maternal Grandmother   . Hypertension Maternal Grandmother   . Hyperlipidemia Maternal Grandmother   . Cancer Maternal Grandfather   . Hypertension Maternal Grandfather   . Hypertension Mother   . Cancer Mother    Allergies  Allergen Reactions  . Antihistamines, Chlorpheniramine-Type Other (See Comments)    Hallucinations  . Flagyl [Metronidazole]     Alerted mental statys  . Other     pyridium  . Phenazopyridine Hives and Other (See Comments)    Pyridium. Dizziness and indigestion   Prior to Admission medications   Medication Sig Start Date End Date Taking? Authorizing Provider  docusate sodium (COLACE) 100 MG capsule Take 200 mg by mouth daily.   Yes Historical Provider, MD  Droxidopa 100 MG CAPS Take 100 mg by mouth 4 (four) times daily.  02/22/15  Yes Suanne Marker, MD  fludrocortisone (FLORINEF) 0.1 MG tablet Take 4 tablets (0.4 mg total) by mouth daily. 03/17/15  Yes Chilton Si, MD  gabapentin (NEURONTIN) 400 MG capsule Take 1 capsule (400 mg total) by mouth 3 (three) times daily. 03/07/15  Yes Chelle Jeffery, PA-C  hydrocortisone (CORTEF) 5 MG tablet Take 2 tablets ( ) daily at 7am,  take 5mg  daily with lunch, and take 5mg  daily at 4pm Patient taking differently: Take 5-10 mg by mouth 2 (two) times daily. Take 2 tablets in the morning and 1 at 4pm 01/17/15  Yes Collene GobbleSteven A Markhi Kleckner, MD  meloxicam (MOBIC) 7.5 MG tablet Take 1 tablet (7.5 mg total) by mouth 2 (two) times daily. 03/07/15  Yes Chelle Jeffery, PA-C  midodrine (PROAMATINE) 2.5 MG tablet Take 1 tablet 30 minutes before leaving home or before physical activity at home once daily 04/05/15  Yes Collene GobbleSteven A Krystle Polcyn, MD  morphine (MSIR) 15 MG tablet Take 2 tablets (30 mg total) by mouth every 6 (six) hours as needed for severe pain. 04/05/15  Yes Collene GobbleSteven A Keanu Frickey, MD  venlafaxine (EFFEXOR) 25 MG tablet Take 1 tablet (25 mg total) by mouth 2 (two) times daily with a meal. 02/17/15  Yes Collene GobbleSteven A Laporcha Marchesi, MD   ROS: The patient denies  fevers, chills, night sweats, unintentional weight loss, chest pain, palpitations, wheezing, dyspnea on exertion, nausea, vomiting, abdominal pain, dysuria, hematuria, melena, numbness, weakness, or tingling. +back pain  All other systems have been reviewed and were otherwise negative with the exception of those mentioned in the HPI and as above.    PHYSICAL EXAM: Filed Vitals:   04/21/15 1300  BP: 118/80  Pulse: 90  Temp: 97.8 F (36.6 C)  Resp: 18   Body mass index is 23.8 kg/(m^2).  General: Alert, no acute distress. Chronically ill appearing male.  HEENT:  Normocephalic, atraumatic, oropharynx patent. Eye: [facility] Cardiovascular:  Regular rate and rhythm, no rubs murmurs or gallops. No Carotid bruits, radial pulse intact. No pedal edema.  Respiratory: Clear to auscultation bilaterally. No wheezes, rales, or rhonchi. No cyanosis, no use of accessory musculature Abdominal: No organomegaly, abdomen is soft. Tenderness in right lower abdomen. Positive bowel sounds. No masses. Musculoskeletal: Gait intact. No edema, tenderness Skin: No rashes. Neurologic: Facial musculature symmetric. Psychiatric: Patient acts appropriately throughout our interaction. Lymphatic: No cervical or submandibular lymphadenopathy  LABS:  EKG/XRAY:   Primary [facility]  ASSESSMENT/PLAN: He is scheduled for EMG nerve conduction studies tomorrow. I refilled his morphine prescription he takes for chronic pain. I will check on the status of his referrals to pain management when patient is lying down he has to sit for a while. When he stands he cannot stay standing he needs to immediately ambulate. If  He stands for a period of time after sitting he becomes hypotensive and has to lay down again. It is better for him if he immediately upon standing begins to walk..I personally performed the services described in this documentation, which was scribed in my presence. The recorded  information has been reviewed and is accurate.    Gross sideeffects, risk and benefits, and alternatives of medications d/w patient. Patient is aware that all medications have potential sideeffects and we are unable to predict every sideeffect or drug-drug interaction that may occur.  Lesle ChrisSteven Savannaha Stonerock MD 04/21/2015 2:20 PM

## 2015-04-21 NOTE — Patient Instructions (Signed)
We'll recheck in the office in 2 weeks

## 2015-04-22 ENCOUNTER — Ambulatory Visit (INDEPENDENT_AMBULATORY_CARE_PROVIDER_SITE_OTHER): Payer: Medicare Other | Admitting: Diagnostic Neuroimaging

## 2015-04-22 ENCOUNTER — Encounter (INDEPENDENT_AMBULATORY_CARE_PROVIDER_SITE_OTHER): Payer: Self-pay | Admitting: Diagnostic Neuroimaging

## 2015-04-22 DIAGNOSIS — R202 Paresthesia of skin: Secondary | ICD-10-CM | POA: Diagnosis not present

## 2015-04-22 DIAGNOSIS — G6181 Chronic inflammatory demyelinating polyneuritis: Secondary | ICD-10-CM

## 2015-04-22 DIAGNOSIS — G909 Disorder of the autonomic nervous system, unspecified: Secondary | ICD-10-CM

## 2015-04-22 DIAGNOSIS — Z0289 Encounter for other administrative examinations: Secondary | ICD-10-CM

## 2015-04-22 DIAGNOSIS — I951 Orthostatic hypotension: Secondary | ICD-10-CM

## 2015-04-22 NOTE — Procedures (Signed)
   [facility]  NCS (NERVE CONDUCTION STUDY) WITH EMG (ELECTROMYOGRAPHY) REPORT   STUDY DATE: 04/22/15 PATIENT NAME: Jimmy [name] DOB: 10-01-1965 MRN: [id number]  ORDERING CLINICIAN: Joycelyn SchmidVikram Spencer Peterkin, MD   TECHNOLOGIST: Gearldine ShownLorraine Jones  ELECTROMYOGRAPHER: Glenford BayleyVikram R. Dorr Perrot, MD  CLINICAL INFORMATION: 49 year old male with orthostatic hypotension and numbness in hands and feet.  FINDINGS: NERVE CONDUCTION STUDY: Right median motor response has prolonged distal latency, decreased amplitude and normal conduction velocity. Left median motor response has prolonged distal latency 6.5 ms, decreased amplitude, normal conduction velocity.  Right ulnar motor response is prolonged distal latency, partial conduction block with stimulation above and below the elbow compared to distally, slow conduction velocity (31 m/s below the elbow, 35 m/s above the elbow) and prolonged F-wave latency. Left ulnar motor response has prolonged distal latency, decreased amplitude, slow conduction velocity and prolonged F-wave latency.  Bilateral peroneal and tibial motor responses could not be obtained with increased duration and sensitivity.  Bilateral median sensory responses have prolonged distal latencies and decreased amplitudes. Bilateral ulnar, sural and peroneal sensory responses could not be obtained.   NEEDLE ELECTROMYOGRAPHY: Needle examination of right lower extremity vastus medialis, tibialis anterior, gastrocnemius demonstrates decreased motor unit recruitment in all muscles. Positive sharp waves and fibrillation potentials noted in tibialis anterior and gastrocnemius.   IMPRESSION:  Abnormal study demonstrating: - Widespread sensorimotor polyneuropathy with axonal and demyelinating features. Active and chronic denervation changes noted on each needle EMG. Given the clinical context, considerations include immune neuropathies (CIDP, anti-sulfatide, anti-MAG, anti-GM1,  monoclonal gammopathy), hereditary neuropathies (CMT, HMSN), infectious neuropathies (HIV) or metabolic causes (diabetes, hypothyroidism, liver disease).     INTERPRETING PHYSICIAN:  Suanne MarkerVIKRAM R. Jaeveon Ashland, MD Certified in Neurology, Neurophysiology and Neuroimaging  [facility] [address], [address] [place], [place] [address] [phone]

## 2015-05-04 ENCOUNTER — Telehealth: Payer: Self-pay | Admitting: Diagnostic Neuroimaging

## 2015-05-04 NOTE — Telephone Encounter (Signed)
I called back.  Was on hold for over 20 minutes without an answer.  Left message on provider line asking that they call us back.

## 2015-05-04 NOTE — Telephone Encounter (Signed)
[facility] [phone] called to request Rx for maintenance medication of [place]

## 2015-05-05 ENCOUNTER — Ambulatory Visit (INDEPENDENT_AMBULATORY_CARE_PROVIDER_SITE_OTHER): Payer: Medicare Other | Admitting: Emergency Medicine

## 2015-05-05 VITALS — BP 160/80 | HR 72 | Temp 98.2°F | Resp 16 | Ht 71.0 in | Wt 171.2 lb

## 2015-05-05 DIAGNOSIS — M545 Low back pain, unspecified: Secondary | ICD-10-CM

## 2015-05-05 MED ORDER — GABAPENTIN 400 MG PO CAPS: 400.0000 mg | ORAL_CAPSULE | Freq: Three times a day (TID) | ORAL | Status: DC

## 2015-05-05 MED ORDER — MORPHINE SULFATE 15 MG PO TABS: 30.0000 mg | ORAL_TABLET | Freq: Four times a day (QID) | ORAL | Status: DC | PRN

## 2015-05-05 MED ORDER — MELOXICAM 7.5 MG PO TABS: 7.5000 mg | ORAL_TABLET | Freq: Two times a day (BID) | ORAL | Status: DC

## 2015-05-05 NOTE — Progress Notes (Signed)
Patient ID: Jimmy [name], male   DOB: February 24, 1966, 50 y.o.   MRN: [id number]     By signing my name below, I, Littie Deedsichard Sun, attest that this documentation has been prepared under the direction and in the presence of Lesle ChrisSteven Dynisha Due, MD.  Electronically Signed: Littie Deedsichard Sun, Medical Scribe. 05/05/2015. 10:33 AM.   Chief Complaint:  Chief Complaint  Patient presents with  . Follow-up  . Medication Refill    morphine, meloicam, and gabapentin    HPI: Jimmy [name] is a 50 y.o. male with a history of chronic pain syndrome who [facility]up and medication refills. Patient notes his nerve pain is currently at a 7/10 in severity right now. He is still taking the same amount of pain medications, 2 pills 4 times a day. He did have his EMG nerve conduction studies done with Dr. Marjory LiesPenumalli 2 weeks ago and notes the results were not good. He will have a spinal tap tomorrow. Patient has also been having some dizziness/balance issues. He notes that his Northera was increased.  Patient notes that her mother, [place] lives in [place], has not been doing well due to cancer. His sister takes care of her and is also in the care of hospice.  Past Medical History  Diagnosis Date  . Back pain   . DDD (degenerative disc disease), cervical   . Spinal stenosis   . Headache     migraines   Past Surgical History  Procedure Laterality Date  . Back surgery  2002,2009    x 2, lower , job injury, spinal stenosis   Social History   Social History  . Marital Status: Married    Spouse Name: N/A  . Number of Children: 4  . Years of Education: 12   Occupational History  .      disabled   Social History Main Topics  . Smoking status: Former Smoker    Quit date: 05/02/2007  . Smokeless tobacco: Never Used  . Alcohol Use: No  . Drug Use: No  . Sexual Activity: Yes   Other Topics Concern  . None   Social History Narrative   Lives at home with wife, children   Caffeine use- 1 soda  a day         Epworth Sleepiness Scale = 4 (as of 03/17/15)   Family History  Problem Relation Age of Onset  . Heart disease Maternal Grandmother   . Hypertension Maternal Grandmother   . Hyperlipidemia Maternal Grandmother   . Cancer Maternal Grandfather   . Hypertension Maternal Grandfather   . Hypertension Mother   . Cancer Mother    Allergies  Allergen Reactions  . Antihistamines, Chlorpheniramine-Type Other (See Comments)    Hallucinations  . Flagyl [Metronidazole]     Alerted mental statys  . Other     pyridium  . Phenazopyridine Hives and Other (See Comments)    Pyridium. Dizziness and indigestion   Prior to Admission medications   Medication Sig Start Date End Date Taking? Authorizing Provider  docusate sodium (COLACE) 100 MG capsule Take 200 mg by mouth daily.   Yes Historical Provider, MD  Droxidopa 100 MG CAPS Take 100 mg by mouth 4 (four) times daily.  02/22/15  Yes Suanne MarkerVikram R Penumalli, MD  fludrocortisone (FLORINEF) 0.1 MG tablet Take 4 tablets (0.4 mg total) by mouth daily. 03/17/15  Yes Chilton Siiffany [place], MD  gabapentin (NEURONTIN) 400 MG capsule Take 1 capsule (400 mg total) by mouth 3 (three) times  daily. 03/07/15  Yes Chelle Jeffery, PA-C  hydrocortisone (CORTEF) 5 MG tablet Take 2 tablets (10mg ) daily at 7am, take 5mg  daily with lunch, and take 5mg  daily at 4pm Patient taking differently: Take 5-10 mg by mouth 2 (two) times daily. Take 2 tablets in the morning and 1 at 4pm 01/17/15  Yes Collene Gobble, MD  meloxicam (MOBIC) 7.5 MG tablet Take 1 tablet (7.5 mg total) by mouth 2 (two) times daily. 03/07/15  Yes Chelle Jeffery, PA-C  midodrine (PROAMATINE) 2.5 MG tablet Take 1 tablet 30 minutes before leaving home or before physical activity at home once daily 04/05/15  Yes Collene Gobble, MD  morphine (MSIR) 15 MG tablet Take 2 tablets (30 mg total) by mouth every 6 (six) hours as needed for severe pain. 04/21/15  Yes Collene Gobble, MD  venlafaxine (EFFEXOR) 25 MG  tablet Take 1 tablet (25 mg total) by mouth 2 (two) times daily with a meal. 02/17/15  Yes Collene Gobble, MD     ROS: The patient denies fevers, chills, night sweats, unintentional weight loss, chest pain, palpitations, wheezing, dyspnea on exertion, nausea, vomiting, dysuria, hematuria, melena.   All other systems have been reviewed and were otherwise negative with the exception of those mentioned in the HPI and as above.    PHYSICAL EXAM: Filed Vitals:   05/05/15 0953  BP: 160/80  Pulse: 72  Temp: 98.2 F (36.8 C)  Resp: 16   Body mass index is 23.89 kg/(m^2).   General: Chronically ill, in no acute distress. HEENT:  Normocephalic, atraumatic, oropharynx patent. Eye: Nonie Hoyer [facility] Cardiovascular:  Regular rate and rhythm, no rubs murmurs or gallops.  No Carotid bruits, radial pulse intact. No pedal edema. Repeat blood pressure: 156/78. Respiratory: Clear to auscultation bilaterally.  No wheezes, rales, or rhonchi.  No cyanosis, no use of accessory musculature Abdominal: Mild right lower abdominal tenderness. Musculoskeletal: Gait intact. No edema, tenderness Skin: No rashes. Neurologic: Facial musculature symmetric. Psychiatric: Patient acts appropriately throughout our interaction. Lymphatic: No cervical or submandibular lymphadenopathy     LABS:    EKG/XRAY:   Primary read [facility].   ASSESSMENT/PLAN: Apparently his  Nerve conduction studies were very abnormal. He is scheduled for a spinal tap. His pain medications were refilled.I personally performed the services described in this documentation, which was scribed in my presence. The recorded information has been reviewed and is accurate.    Gross sideeffects, risk and benefits, and alternatives of medications d/w patient. Patient is aware that all medications have potential sideeffects and we are unable to predict every sideeffect or drug-drug interaction that may occur.  [name]  MD 05/05/2015 10:33 AM

## 2015-05-06 ENCOUNTER — Ambulatory Visit
Admission: RE | Admit: 2015-05-06 | Discharge: 2015-05-06 | Disposition: A | Discharge status: Home / Self Care | Payer: Medicare Other | Source: Ambulatory Visit | Attending: Diagnostic Neuroimaging | Admitting: Diagnostic Neuroimaging

## 2015-05-06 ENCOUNTER — Telehealth: Payer: Self-pay | Admitting: *Deleted

## 2015-05-06 ENCOUNTER — Other Ambulatory Visit: Payer: Self-pay

## 2015-05-06 ENCOUNTER — Other Ambulatory Visit (HOSPITAL_COMMUNITY)
Admission: RE | Admit: 2015-05-06 | Discharge: 2015-05-06 | Disposition: A | Discharge status: Home / Self Care | Payer: Medicare Other | Source: Ambulatory Visit | Attending: Diagnostic Neuroimaging | Admitting: Diagnostic Neuroimaging

## 2015-05-06 ENCOUNTER — Other Ambulatory Visit: Payer: Self-pay | Admitting: Diagnostic Neuroimaging

## 2015-05-06 DIAGNOSIS — G6181 Chronic inflammatory demyelinating polyneuritis: Secondary | ICD-10-CM | POA: Diagnosis present

## 2015-05-06 DIAGNOSIS — R839 Unspecified abnormal finding in cerebrospinal fluid: Secondary | ICD-10-CM | POA: Diagnosis not present

## 2015-05-06 LAB — CSF CELL COUNT WITH DIFFERENTIAL
RBC Count, CSF: 1 cu mm — ABNORMAL HIGH
Tube #: 3
WBC, CSF: 1 cu mm (ref 0–5)

## 2015-05-06 LAB — GLUCOSE, CSF: Glucose, CSF: 62 mg/dL (ref 43–76)

## 2015-05-06 LAB — PROTEIN, CSF: Total Protein, CSF: 110 mg/dL — ABNORMAL HIGH (ref 15–45)

## 2015-05-06 MED ORDER — DIAZEPAM 5 MG PO TABS
10.0000 mg | ORAL_TABLET | Freq: Once | ORAL | Status: AC
  Administered 2015-05-06: 10 mg via ORAL

## 2015-05-06 MED ORDER — DROXIDOPA 100 MG PO CAPS: 200.0000 mg | ORAL_CAPSULE | Freq: Three times a day (TID) | ORAL | Status: DC

## 2015-05-06 NOTE — Telephone Encounter (Signed)
I called back.  Spoke with [facility].  They would like a new Rx faxed to them at [phone].  Request entered, forwarded to provider for signature.

## 2015-05-06 NOTE — Telephone Encounter (Signed)
Received call from Susa LofflerJessica, [facility] with LP results. Results in Epic but she will fax over for Dr Penumalli's review.

## 2015-05-06 NOTE — Telephone Encounter (Signed)
[facility] with [facility] is returning your call. She can be reached at [phone] ext. [phone]. Thank you.

## 2015-05-06 NOTE — Discharge Instructions (Signed)

## 2015-05-07 NOTE — Telephone Encounter (Signed)
Lab results [facility] yesterday.

## 2015-05-09 LAB — CSF CULTURE W GRAM STAIN
Gram Stain: NONE SEEN
Gram Stain: NONE SEEN
Organism ID, Bacteria: NO GROWTH

## 2015-05-18 DIAGNOSIS — R030 Elevated blood-pressure reading, without diagnosis of hypertension: Secondary | ICD-10-CM | POA: Diagnosis not present

## 2015-05-21 ENCOUNTER — Ambulatory Visit (INDEPENDENT_AMBULATORY_CARE_PROVIDER_SITE_OTHER): Payer: Medicare Other | Admitting: Physician Assistant

## 2015-05-21 VITALS — BP 138/76 | HR 74 | Temp 98.3°F | Resp 16 | Ht 71.0 in | Wt 179.8 lb

## 2015-05-21 DIAGNOSIS — M545 Low back pain, unspecified: Secondary | ICD-10-CM

## 2015-05-21 MED ORDER — MORPHINE SULFATE 15 MG PO TABS: 30.0000 mg | ORAL_TABLET | Freq: Four times a day (QID) | ORAL | Status: DC | PRN

## 2015-05-21 NOTE — Progress Notes (Signed)
Jimmy [name]  MRN: [id number] DOB: Dec 10, 1965  Subjective:  Pt presents to clinic for a medication refill.  He has had no change in his status.  He is still being evaluated by specialist - he has decreased some of his hypotensive medications because his BP has been staying elevated on its own.  He typically sees Dr Cleta Alberts every 2 weeks but they messed up his schedule this week.  Patient Active Problem List   Diagnosis Date Noted  . Low serum cortisol level (HCC) 01/27/2015  . Hyperglycemia 01/03/2015  . Chronic pain syndrome   . Hyponatremia   . Hypotension, postural   . Syncope 12/17/2014  . Faintness   . Right hip pain   . Abnormal EKG 12/12/2014  . POTS (postural orthostatic tachycardia syndrome)   . Adrenal insufficiency (HCC)   . Groin pain   . Orthostatic hypotension   . Lower abdominal pain 12/04/2014  . Weight loss, unintentional   . Absolute anemia   . Prostatitis 12/03/2014  . Abdominal pain 12/02/2014  . Hypokalemia 12/02/2014  . Urinary retention 12/02/2014  . Prostatitis, acute   . Abdominal pain, lower   . Chronic lower back pain     Current Outpatient Prescriptions on File Prior to Visit  Medication Sig Dispense Refill  . docusate sodium (COLACE) 100 MG capsule Take 200 mg by mouth daily.    Marland Kitchen gabapentin (NEURONTIN) 400 MG capsule Take 1 capsule (400 mg total) by mouth 3 (three) times daily. 90 capsule 1  . hydrocortisone (CORTEF) 5 MG tablet Take 2 tablets ( ) daily at 7am, take  daily with lunch, and take  daily at 4pm (Patient taking differently: Take 5-10 mg by mouth 2 (two) times daily. Take 2 tablets in the morning and 1 at 4pm) 120 tablet 3  . meloxicam (MOBIC) 7.5 MG tablet Take 1 tablet (7.5 mg total) by mouth 2 (two) times daily. 60 tablet 1  . midodrine (PROAMATINE) 2.5 MG tablet Take 1 tablet 30 minutes before leaving home or before physical activity at home once daily 30 tablet 5  . venlafaxine (EFFEXOR) 25 MG tablet Take 1 tablet (25  mg total) by mouth 2 (two) times daily with a meal. 30 tablet 3  . Droxidopa 100 MG CAPS Take 200 mg by mouth 3 (three) times daily. (Patient not taking: Reported on 05/21/2015) 180 capsule 12  . fludrocortisone (FLORINEF) 0.1 MG tablet Take 4 tablets (0.4 mg total) by mouth daily. (Patient not taking: Reported on 05/21/2015) 120 tablet 11   No current facility-administered medications on file prior to visit.    Allergies  Allergen Reactions  . Flagyl [Metronidazole] Other (See Comments)    Alerted mental status  . Antihistamines, Chlorpheniramine-Type Other (See Comments)    Hallucinations  . Pyridium [Phenazopyridine Hcl] Hives and Other (See Comments)    Dizziness and indigestion    Review of Systems Objective:  BP 138/76 mmHg  Pulse 74  Temp(Src) 98.3 F (36.8 C) (Oral)  Resp 16  Ht  (1.803 m)  Wt 179 lb 12.8 oz (81.557 kg)  BMI 25.09 kg/m2  SpO2 98%  Physical Exam  Constitutional: He is oriented to person, place, and time and well-developed, well-nourished, and in no distress.  HENT:  Head: Normocephalic and atraumatic.  Right Ear: External ear normal.  Left Ear: External ear normal.  Eyes: Conjunctivae are normal.  Neck: Normal range of motion.  Pulmonary/Chest: Effort normal.  Neurological: He is alert and oriented to person, place, and  time. Gait normal.  Skin: Skin is warm and dry.  Psychiatric: Mood, memory, affect and judgment normal.    Assessment and Plan :  Back pain at L4-L5 level - Plan: morphine (MSIR) 15 MG tablet  Rx refilled - reminded them about the controlled substance policy but it looks like this is not typical for the patient.  He will f/u with Dr Cleta Alberts in 2 weeks.  Benny Lennert PA-C  [facility] 05/21/2015 9:38 AM

## 2015-05-25 ENCOUNTER — Telehealth: Payer: Self-pay | Admitting: Diagnostic Neuroimaging

## 2015-05-25 NOTE — Telephone Encounter (Signed)
I called patient's wife. CSF protein is elevated. May represent CIDP. Will setup trial of IVIG. -VRP

## 2015-05-27 NOTE — Telephone Encounter (Signed)
Error no information

## 2015-05-31 ENCOUNTER — Encounter (HOSPITAL_COMMUNITY): Payer: Self-pay

## 2015-05-31 ENCOUNTER — Emergency Department (HOSPITAL_COMMUNITY)
Admission: EM | Admit: 2015-05-31 | Discharge: 2015-06-01 | Disposition: A | Discharge status: Home / Self Care | Payer: Medicare Other | Attending: Emergency Medicine | Admitting: Emergency Medicine

## 2015-05-31 DIAGNOSIS — G8929 Other chronic pain: Secondary | ICD-10-CM | POA: Diagnosis not present

## 2015-05-31 DIAGNOSIS — Z79899 Other long term (current) drug therapy: Secondary | ICD-10-CM | POA: Diagnosis not present

## 2015-05-31 DIAGNOSIS — R079 Chest pain, unspecified: Secondary | ICD-10-CM | POA: Diagnosis present

## 2015-05-31 DIAGNOSIS — I10 Essential (primary) hypertension: Secondary | ICD-10-CM | POA: Diagnosis not present

## 2015-05-31 DIAGNOSIS — Z791 Long term (current) use of non-steroidal anti-inflammatories (NSAID): Secondary | ICD-10-CM | POA: Diagnosis not present

## 2015-05-31 DIAGNOSIS — R200 Anesthesia of skin: Secondary | ICD-10-CM | POA: Diagnosis not present

## 2015-05-31 DIAGNOSIS — G43909 Migraine, unspecified, not intractable, without status migrainosus: Secondary | ICD-10-CM | POA: Insufficient documentation

## 2015-05-31 DIAGNOSIS — Z87891 Personal history of nicotine dependence: Secondary | ICD-10-CM | POA: Diagnosis not present

## 2015-05-31 DIAGNOSIS — Z8739 Personal history of other diseases of the musculoskeletal system and connective tissue: Secondary | ICD-10-CM | POA: Diagnosis not present

## 2015-05-31 DIAGNOSIS — R0789 Other chest pain: Secondary | ICD-10-CM | POA: Insufficient documentation

## 2015-05-31 NOTE — ED Notes (Signed)
Per EMS, called out tonight after waking up at 2220, pt states that he "felt bad" and states "I can feel when my blood pressure goes up." Pt states that shortly after that he began having left sided chest pain radiating to the left arm. Pt given 324 ASA with EMS. Upon arrival pt only complains of chronic back pain, no chest pain at this time. NAD. Pt has been having issues with maintaining BP over the last month.

## 2015-05-31 NOTE — ED Provider Notes (Signed)
CSN: [id number]     Arrival date & time 05/31/15  2332 History  By signing my name below, I, Jimmy [name], attest that this documentation has been prepared under the direction and in the presence of Jimmy Baton, MD. Electronically Signed: Budd [name], ED Scribe. 06/01/2015. 2:14 AM.     Chief Complaint  Patient presents with  . Chest Pain   The history is provided by the patient and the spouse. No language interpreter was used.   HPI Comments: Jimmy [name] is a 50 y.o. male former smoker with a PMHx of chronic back pain and DDD brought in by ambulance, who presents to the Emergency Department complaining of left-sided chest pain radiating into the left arm onset 1 hour and 40 minutes ago. Pt states he woke up at night feeling hot all over due to HTN (measured at 190//93 measured at home), after which the pain began, lasting for about 20 minutes, rated as a 7/10. He notes the pain then spontaneously resolved. He reports a previous episode of the same 5 nights ago. He endorses associated numbness of the left arm, and mild SOB. He notes a PMHx of an autonomic condition for which he was on blood pressure medication among others, which has been discontinued due to causing HTN. Per wife, pt's BP has been "all over the place" since starting and discontinuing the autonomic medication, usually spiking when pt is sleeping. She notes pt is being seen by his neurologist for the HTN. Pt also endorses a PMHx of neuropathy and had an LP done last month, which came back showing evidence of proteins. He reports a FHx of HTN, but denies FHx of MI. He denies any recent travel or illness, as well as a PMHx of blood clots. Pt denies cough, fever, and n/v/d.    Past Medical History  Diagnosis Date  . Back pain   . DDD (degenerative disc disease), cervical   . Spinal stenosis   . Headache     migraines   Past Surgical History  Procedure Laterality Date  . Back surgery  2002,2009    x 2, lower , job  injury, spinal stenosis   Family History  Problem Relation Age of Onset  . Heart disease Maternal Grandmother   . Hypertension Maternal Grandmother   . Hyperlipidemia Maternal Grandmother   . Cancer Maternal Grandfather   . Hypertension Maternal Grandfather   . Hypertension Mother   . Cancer Mother    Social History  Substance Use Topics  . Smoking status: Former Smoker    Quit date: 05/02/2007  . Smokeless tobacco: Never Used  . Alcohol Use: No    Review of Systems  Constitutional: Negative for fever.  Respiratory: Positive for shortness of breath. Negative for cough.   Cardiovascular: Positive for chest pain.  Gastrointestinal: Negative for nausea, vomiting and diarrhea.  Neurological: Positive for numbness.  All other systems reviewed and are negative.   Allergies  Flagyl; Antihistamines, chlorpheniramine-type; and Pyridium  Home Medications   Prior to Admission medications   Medication Sig Start Date End Date Taking? Authorizing Provider  gabapentin (NEURONTIN) 400 MG capsule Take 1 capsule (400 mg total) by mouth 3 (three) times daily. 05/05/15  Yes Collene Gobble, MD  hydrocortisone (CORTEF) 5 MG tablet Take 2 tablets ( ) daily at 7am, take  daily with lunch, and take  daily at 4pm Patient taking differently: Take 5-10 mg by mouth 2 (two) times daily. Take 2 tablets in the morning and 1 at  4pm 01/17/15  Yes Collene Gobble, MD  meloxicam (MOBIC) 7.5 MG tablet Take 1 tablet (7.5 mg total) by mouth 2 (two) times daily. 05/05/15  Yes Collene Gobble, MD  morphine (MSIR) 15 MG tablet Take 2 tablets (30 mg total) by mouth every 6 (six) hours as needed for severe pain. 05/21/15  Yes Morrell Riddle, PA-C  polyethylene glycol (MIRALAX / GLYCOLAX) packet Take 17 g by mouth daily as needed for mild constipation.   Yes Historical Provider, MD  Droxidopa 100 MG CAPS Take 200 mg by mouth 3 (three) times daily. Patient not taking: Reported on 05/21/2015 05/06/15   Suanne Marker, MD   fludrocortisone (FLORINEF) 0.1 MG tablet Take 4 tablets (0.4 mg total) by mouth daily. Patient not taking: Reported on 05/21/2015 03/17/15   Chilton Si, MD  midodrine (PROAMATINE) 2.5 MG tablet Take 1 tablet 30 minutes before leaving home or before physical activity at home once daily Patient not taking: Reported on 06/01/2015 04/05/15   Collene Gobble, MD  venlafaxine (EFFEXOR) 25 MG tablet Take 1 tablet (25 mg total) by mouth 2 (two) times daily with a meal. Patient not taking: Reported on 06/01/2015 02/17/15   Collene Gobble, MD   BP 159/83 mmHg  Pulse 73  Temp(Src) 98.3 F (36.8 C) (Oral)  Resp 14  Ht  (1.803 m)  Wt 180 lb (81.647 kg)  BMI 25.12 kg/m2  SpO2 99% Physical Exam  Constitutional: He is oriented to person, place, and time. He appears well-developed and well-nourished. No distress.  HENT:  Head: Normocephalic and atraumatic.  Cardiovascular: Normal rate, regular rhythm and normal heart sounds.   No murmur heard. Pulmonary/Chest: Effort normal and breath sounds normal. No respiratory distress. He has no wheezes. He exhibits tenderness.  Abdominal: Soft. Bowel sounds are normal. There is no tenderness. There is no rebound.  Musculoskeletal: He exhibits no edema.  Neurological: He is alert and oriented to person, place, and time.  Skin: Skin is warm and dry.  Psychiatric: He has a normal mood and affect.  Nursing note and vitals reviewed.   ED Course  Procedures  DIAGNOSTIC STUDIES: Oxygen Saturation is 99% on RA, normal by my interpretation.    COORDINATION OF CARE: 12:35 AM - Discussed plans to wait on diagnostic studies and imaging results. Pt advised of plan for treatment and pt agrees.  2:12 AM - Pt refuses to undergo a second troponin test, stating he wants to go home. Discussed plans to discharge. Pt advised of plan for treatment and pt agrees.   Labs Review Labs Reviewed  CBC - Abnormal; Notable for the following:    RBC 3.95 (*)    HCT 38.2 (*)     Platelets 148 (*)    All other components within normal limits  BASIC METABOLIC PANEL  I-STAT TROPOININ, ED  Rosezena Sensor, ED    Imaging Review Dg Chest 2 View  06/01/2015  CLINICAL DATA:  Left-sided chest and arm pain tonight. EXAM: CHEST  2 VIEW COMPARISON:  Chest CT 03/18/2015, radiographs 02/02/2015 FINDINGS: The cardiomediastinal contours are normal. The lungs are clear. Pulmonary vasculature is normal. No consolidation, pleural effusion, or pneumothorax. No acute osseous abnormalities are seen. IMPRESSION: No acute pulmonary process. Electronically Signed   By: Rubye Oaks M.D.   On: 06/01/2015 00:59   I have personally reviewed and evaluated these images and lab results as part of my medical decision-making.   EKG Interpretation   Date/Time:  Monday May 31 2015 23:38:31 EST Ventricular Rate:  74 PR Interval:  167 QRS Duration: 93 QT Interval:  377 QTC Calculation: 418 R Axis:   68 Text Interpretation:  Sinus rhythm improved T wave abnormalities,  inferolaterally Confirmed by Kylo Gavin  MD, Khadim Lundberg ([id number]) on 06/01/2015  12:08:23 AM      MDM   Final diagnoses:  Other chest pain  Essential hypertension    Patient presents with chest pain. Onset of symptoms around 10:15. Reports that he felt hot when he woke up. He states that he has had several episodes of like this when his blood pressure is elevated. History of autonomic dysfunction but recently has been hypertensive at times. Currently he is asymptomatic. Chest pain lasted for 20 minutes. He had a full dose aspirin prior to arrival. He is nontoxic. Blood pressure initially 171/91. Exam is benign. EKG is essentially unchanged with actually improvement of nonspecific T wave changes. Chest x-ray is negative. Initial troponin is negative. He is PERC negative and doubt PE. Second troponin ordered given onset of symptoms. Heart score is 2.  2:23 AM I was informed by the lab that the patient is refusing repeat  troponin. I went in to discuss with the patient utility of a second troponin. He is very frustrated and states "I've been waiting here for ever and I will not have another test." Patient has been here for 2-1/2 hours. I discussed with him that it does take some time to get basic labwork and imaging back. I apologize for not updating him sooner. He does not understand why he needs a second troponin and continues to refuse despite my education and urging for a second troponin. He does have a cardiologist. He has been chest pain-free while in the emergency room. We'll have him follow-up closely with cardiology regarding both his chest pain and his blood pressure issues. He was given strict return precautions.  After history, exam, and medical workup I feel the patient has been appropriately medically screened and is safe for discharge home. Pertinent diagnoses were discussed with the patient. Patient was given return precautions.  I personally performed the services described in this documentation, which was scribed in my presence. The recorded information has been reviewed and is accurate.   Jimmy Baton, MD 06/01/15 [phone]

## 2015-06-01 ENCOUNTER — Telehealth: Payer: Self-pay | Admitting: Emergency Medicine

## 2015-06-01 ENCOUNTER — Emergency Department (HOSPITAL_COMMUNITY): Payer: Medicare Other

## 2015-06-01 DIAGNOSIS — R079 Chest pain, unspecified: Secondary | ICD-10-CM | POA: Diagnosis not present

## 2015-06-01 LAB — BASIC METABOLIC PANEL
Anion gap: 10 (ref 5–15)
BUN: 17 mg/dL (ref 6–20)
CO2: 29 mmol/L (ref 22–32)
Calcium: 9 mg/dL (ref 8.9–10.3)
Chloride: 105 mmol/L (ref 101–111)
Creatinine, Ser: 0.96 mg/dL (ref 0.61–1.24)
GFR calc Af Amer: >60 mL/min (ref >60)
GFR calc non Af Amer: >60 mL/min (ref >60)
Glucose, Bld: 93 mg/dL (ref 65–99)
Potassium: 3.5 mmol/L (ref 3.5–5.1)
Sodium: 144 mmol/L (ref 135–145)

## 2015-06-01 LAB — CBC
HCT: 38.2 % — ABNORMAL LOW (ref 39.0–52.0)
Hemoglobin: 13 g/dL (ref 13.0–17.0)
MCH: 32.9 pg (ref 26.0–34.0)
MCHC: 34 g/dL (ref 30.0–36.0)
MCV: 96.7 fL (ref 78.0–100.0)
Platelets: 148 10*3/uL — ABNORMAL LOW (ref 150–400)
RBC: 3.95 MIL/uL — ABNORMAL LOW (ref 4.22–5.81)
RDW: 12.4 % (ref 11.5–15.5)
WBC: 4.6 10*3/uL (ref 4.0–10.5)

## 2015-06-01 LAB — I-STAT TROPONIN, ED: Troponin i, poc: 0 ng/mL (ref 0.00–0.08)

## 2015-06-01 NOTE — Telephone Encounter (Signed)
Spoke with the Pt. He said he will give [place] a call when he has his appointment set up, and he said he has been doing good.

## 2015-06-01 NOTE — ED Notes (Signed)
Pt verbalized understanding of d/c instructions and has no further questions. Pt stable and NAD. Pt to follow up with cardiologist.  

## 2015-06-01 NOTE — Discharge Instructions (Signed)
You were seen today for chest pain. Your initial lab work is reassuring. I cannot fully rule out heart disease while in the emergency department. He need to follow-up with her cardiologist regarding potential further testing and your blood pressure. If you have any new or worsening symptoms you should be reevaluated immediately.  Nonspecific Chest Pain  Chest pain can be caused by many different conditions. There is always a chance that your pain could be related to something serious, such as a heart attack or a blood clot in your lungs. Chest pain can also be caused by conditions that are not life-threatening. If you have chest pain, it is very important to follow up with your health care provider. CAUSES  Chest pain can be caused by:  Heartburn.  Pneumonia or bronchitis.  Anxiety or stress.  Inflammation around your heart (pericarditis) or lung (pleuritis or pleurisy).  A blood clot in your lung.  A collapsed lung (pneumothorax). It can develop suddenly on its own (spontaneous pneumothorax) or from trauma to the chest.  Shingles infection (varicella-zoster virus).  Heart attack.  Damage to the bones, muscles, and cartilage that make up your chest wall. This can include:  Bruised bones due to injury.  Strained muscles or cartilage due to frequent or repeated coughing or overwork.  Fracture to one or more ribs.  Sore cartilage due to inflammation (costochondritis). RISK FACTORS  Risk factors for chest pain may include:  Activities that increase your risk for trauma or injury to your chest.  Respiratory infections or conditions that cause frequent coughing.  Medical conditions or overeating that can cause heartburn.  Heart disease or family history of heart disease.  Conditions or health behaviors that increase your risk of developing a blood clot.  Having had chicken pox (varicella zoster). SIGNS AND SYMPTOMS Chest pain can feel like:  Burning or tingling on the  surface of your chest or deep in your chest.  Crushing, pressure, aching, or squeezing pain.  Dull or sharp pain that is worse when you move, cough, or take a deep breath.  Pain that is also felt in your back, neck, shoulder, or arm, or pain that spreads to any of these areas. Your chest pain may come and go, or it may stay constant. DIAGNOSIS Lab tests or other studies may be needed to find the cause of your pain. Your health care provider may have you take a test called an ambulatory ECG (electrocardiogram). An ECG records your heartbeat patterns at the time the test is performed. You may also have other tests, such as:  Transthoracic echocardiogram (TTE). During echocardiography, sound waves are used to create a picture of all of the heart structures and to look at how blood flows through your heart.  Transesophageal echocardiogram (TEE).This is a more advanced imaging test that obtains images from inside your body. It allows your health care provider to see your heart in finer detail.  Cardiac monitoring. This allows your health care provider to monitor your heart rate and rhythm in real time.  Holter monitor. This is a portable device that records your heartbeat and can help to diagnose abnormal heartbeats. It allows your health care provider to track your heart activity for several days, if needed.  Stress tests. These can be done through exercise or by taking medicine that makes your heart beat more quickly.  Blood tests.  Imaging tests. TREATMENT  Your treatment depends on what is causing your chest pain. Treatment may include:  Medicines. These may  include:  Acid blockers for heartburn.  Anti-inflammatory medicine.  Pain medicine for inflammatory conditions.  Antibiotic medicine, if an infection is present.  Medicines to dissolve blood clots.  Medicines to treat coronary artery disease.  Supportive care for conditions that do not require medicines. This may  include:  Resting.  Applying heat or cold packs to injured areas.  Limiting activities until pain decreases. HOME CARE INSTRUCTIONS  If you were prescribed an antibiotic medicine, finish it all even if you start to feel better.  Avoid any activities that bring on chest pain.  Do not use any tobacco products, including cigarettes, chewing tobacco, or electronic cigarettes. If you need help quitting, ask your health care provider.  Do not drink alcohol.  Take medicines only as directed by your health care provider.  Keep all follow-up visits as directed by your health care provider. This is important. This includes any further testing if your chest pain does not go away.  If heartburn is the cause for your chest pain, you may be told to keep your head raised (elevated) while sleeping. This reduces the chance that acid will go from your stomach into your esophagus.  Make lifestyle changes as directed by your health care provider. These may include:  Getting regular exercise. Ask your health care provider to suggest some activities that are safe for you.  Eating a heart-healthy diet. A registered dietitian can help you to learn healthy eating options.  Maintaining a healthy weight.  Managing diabetes, if necessary.  Reducing stress. SEEK MEDICAL CARE IF:  Your chest pain does not go away after treatment.  You have a rash with blisters on your chest.  You have a fever. SEEK IMMEDIATE MEDICAL CARE IF:   Your chest pain is worse.  You have an increasing cough, or you cough up blood.  You have severe abdominal pain.  You have severe weakness.  You faint.  You have chills.  You have sudden, unexplained chest discomfort.  You have sudden, unexplained discomfort in your arms, back, neck, or jaw.  You have shortness of breath at any time.  You suddenly start to sweat, or your skin gets clammy.  You feel nauseous or you vomit.  You suddenly feel light-headed or  dizzy.  Your heart begins to beat quickly, or it feels like it is skipping beats. These symptoms may represent a serious problem that is an emergency. Do not wait to see if the symptoms will go away. Get medical help right away. Call your local emergency services (911 in the [place] Do not drive yourself to the hospital.   This information is not intended to replace advice given to you by your health care provider. Make sure you discuss any questions you have with your health care provider.   Document Released: 01/25/2005 Document Revised: 05/08/2014 Document Reviewed: 11/21/2013 Elsevier Interactive Patient Education [employer].

## 2015-06-01 NOTE — ED Notes (Signed)
Dr Wilkie Aye in room with pt to discuss discharge. Pt sitting in his clothes stating he is ready to leave. Pt denies 2nd troponin lab draw.

## 2015-06-01 NOTE — Telephone Encounter (Signed)
Call patient and his wife. Tell them it is very important he follow-up with his cardiologist. Please check on his status today.

## 2015-06-04 ENCOUNTER — Encounter: Payer: Self-pay | Admitting: Physician Assistant

## 2015-06-04 ENCOUNTER — Ambulatory Visit (INDEPENDENT_AMBULATORY_CARE_PROVIDER_SITE_OTHER): Payer: Medicare Other | Admitting: Physician Assistant

## 2015-06-04 VITALS — BP 146/84 | HR 70 | Ht 71.0 in | Wt 184.0 lb

## 2015-06-04 DIAGNOSIS — I10 Essential (primary) hypertension: Secondary | ICD-10-CM

## 2015-06-04 HISTORY — DX: Essential (primary) hypertension: I10

## 2015-06-04 MED ORDER — LISINOPRIL 2.5 MG PO TABS: 2.5000 mg | ORAL_TABLET | Freq: Every day | ORAL | Status: DC

## 2015-06-04 NOTE — Patient Instructions (Signed)
Your physician has recommended you make the following change in your medication:   Start new prescription for lisinopril. This has been sent to your [facility] pharmacy.  Your physician recommends that you schedule a follow-up appointment in: 2 weeks with Dr Duke Salvia, Baxter Hire or [place] for blood pressure follow up.

## 2015-06-04 NOTE — Progress Notes (Signed)
Patient ID: Jimmy [name], male   DOB: 1965-05-23, 50 y.o.   MRN: [id number]    Date:  06/04/2015   ID:  Jimmy [name], DOB 02-05-1966, MRN [id number]  PCP:  Lucilla Edin, MD  Primary Cardiologist:  Duke Salvia   Chief complaint:  Hypertension   History of Present Illness: Jimmy [name] is a 50 y.o. male with adrenal insufficiency, herpes zoster opthalmicus, and syncope who was seen in November 2016 by Dr. Duke Salvia for orthostatic hypotension and syncope. Jimmy [name] has been struggling with syncope since July. In June he had an episode of herpes zoster ophthalmicus. Starting in July he's had recurrent episodes of profound orthostasis. He has severe lightheadedness upon standing that is worse when he stands still then if he is walking. He is unable to stand for more than a few moments at a time without passing out. He's been admitted to the hospital times.  In August 2016 he was diagnosed with prostatitis, mild renal insufficiency, and POTS. He was started on midodrine and florinef. Midodrine was discontinued due to urinary retention. He was discharged on Florinef, hydrocortisone and Northera. Jimmy [name] continues to be under the care of his neurologist, Dr. Joycelyn Schmid. He has struggled with supine hypertension and midodrine was discontinued. At his follow-up appointment with Dr. Marjory Lies on 11/7 the Droxidopa was increased to 100 mg 4 times daily.  Jimmy [name] has tried increasing his fluid intake, increasing his salt intake, and wearing compression stockings. Despite this he continues to have episodes of syncope. He does not think that any of the medications are helping. If he is able to lay down quick enough once the dizziness starts he can sometimes abate a full syncopal episode. However, sometimes he has episodes of syncope even after laying down. He is also struggled from profound anorexia. He was 250 pounds in June and is now 184 pounds. He reports going for days at a time  without eating and only drinking. He was previously on Reglan for gastroparesis, as when he does eat he often feels nauseous. This helped somewhat but was discontinued by his gastroenterologist upon follow-up.  Jimmy [name] has a strong family history of malignancy. His mother had pancreatic cancer, his aunt breast cancer in his grandfather had prostate cancer.  He has spinal stenosis and chronic low back pain. He had back surgeries in 2002 and 2009.  He is unable to exercise because of his back and is on full disability.  Jimmy [name] presents today for evaluation of blood pressure. Reports she was seen in the emergency room on January 31 with complaints of chest pain with radiation to his arm and shoulder. He was a little bit short of breath but no other associated symptoms. He described it as a "cramp".  His EKG showed no acute changes and troponin was negative. Doesn't have a family history of heart disease at early age.   His wife brought in a diary of his blood pressures which have been consistently in the 150s to 170s. On Monday. To 189/93. He still seems to be orthostatic if he stands up and does not move(gets dizzy), however, if he stands up and starts walking immediately he doesn't have any problems. He currently denies nausea, vomiting, fever, chest pain, shortness of breath, orthopnea, dizziness, PND, cough, congestion, abdominal pain, hematochezia, melena, lower extremity edema, claudication.  Wt Readings from Last 3 Encounters:  06/04/15 184 lb (83.462 kg)  05/31/15 180 lb (81.647 kg)  05/21/15 179 lb 12.8 oz (81.557 kg)  Past Medical History  Diagnosis Date  . Back pain   . DDD (degenerative disc disease), cervical   . Spinal stenosis   . Headache     migraines  . Essential hypertension 06/04/2015   Family History  Problem Relation Age of Onset  . Heart disease Maternal Grandmother   . Hypertension Maternal Grandmother   . Hyperlipidemia Maternal Grandmother   . Cancer  Maternal Grandfather   . Hypertension Maternal Grandfather   . Hypertension Mother   . Cancer Mother     Current Outpatient Prescriptions  Medication Sig Dispense Refill  . gabapentin (NEURONTIN) 400 MG capsule Take 1 capsule (400 mg total) by mouth 3 (three) times daily. 90 capsule 1  . hydrocortisone (CORTEF) 5 MG tablet Take 2 tablets (10mg ) daily at 7am, take 5mg  daily with lunch, and take 5mg  daily at 4pm (Patient taking differently: Take 5-10 mg by mouth 2 (two) times daily. Take 2 tablets in the morning and 1 at 4pm) 120 tablet 3  . meloxicam (MOBIC) 7.5 MG tablet Take 1 tablet (7.5 mg total) by mouth 2 (two) times daily. 60 tablet 1  . morphine (MSIR) 15 MG tablet Take 2 tablets (30 mg total) by mouth every 6 (six) hours as needed for severe pain. 120 tablet 0  . polyethylene glycol (MIRALAX / GLYCOLAX) packet Take 17 g by mouth daily as needed for mild constipation.    . Droxidopa 100 MG CAPS Take 200 mg by mouth 3 (three) times daily. (Patient not taking: Reported on 05/21/2015) 180 capsule 12  . fludrocortisone (FLORINEF) 0.1 MG tablet Take 4 tablets (0.4 mg total) by mouth daily. (Patient not taking: Reported on 05/21/2015) 120 tablet 11  . lisinopril (PRINIVIL,ZESTRIL) 2.5 MG tablet Take 1 tablet (2.5 mg total) by mouth daily. 30 tablet 6  . midodrine (PROAMATINE) 2.5 MG tablet Take 1 tablet 30 minutes before leaving home or before physical activity at home once daily (Patient not taking: Reported on 06/01/2015) 30 tablet 5  . venlafaxine (EFFEXOR) 25 MG tablet Take 1 tablet (25 mg total) by mouth 2 (two) times daily with a meal. (Patient not taking: Reported on 06/04/2015) 30 tablet 3   No current facility-administered medications for this visit.    Allergies:    Allergies  Allergen Reactions  . Antihistamines, Chlorpheniramine-Type Other (See Comments)    Hallucinations  . Pyridium [Phenazopyridine Hcl] Hives and Other (See Comments)    Dizziness and indigestion    Social  History:  The patient  reports that he quit smoking about 8 years ago. He has never used smokeless tobacco. He reports that he does not drink alcohol or use illicit drugs.   Family history:   Family History  Problem Relation Age of Onset  . Heart disease Maternal Grandmother   . Hypertension Maternal Grandmother   . Hyperlipidemia Maternal Grandmother   . Cancer Maternal Grandfather   . Hypertension Maternal Grandfather   . Hypertension Mother   . Cancer Mother     ROS:  Please see the history of present illness.  All other systems reviewed and negative.   PHYSICAL EXAM: VS:  BP 146/84 mmHg  Pulse 70  Ht 5\' 11"  (1.803 m)  Wt 184 lb (83.462 kg)  BMI 25.67 kg/m2 Well nourished, well developed, in no acute distress HEENT: Pupils are equal round react to light accommodation extraocular movements are intact.  Neck: no JVDNo cervical lymphadenopathy. Cardiac: Regular rate and rhythm without murmurs rubs or gallops. Lungs:  clear to  auscultation bilaterally, no wheezing, rhonchi or rales Abd: soft, nontender, positive bowel sounds all quadrants, no hepatosplenomegaly Ext: no lower extremity edema.  2+ radial and dorsalis pedis pulses. Skin: warm and dry Neuro:  Grossly normal   ASSESSMENT AND PLAN:  Problem List Items Addressed This Visit    Essential hypertension - Primary   Relevant Medications   lisinopril (PRINIVIL,ZESTRIL) 2.5 MG tablet      This is a difficult situation given the patient's history of hypotension and orthostatic hypotension.  I'm reluctant to put him on too much medication but will start him on 2.5 mg of lisinopril. Titrate as needed. His wife will continue to monitor his blood pressure regularly.  He is no longer taking Midodrine, Florinef droxidopal or Effexor.   Follow-up in a couple weeks.

## 2015-06-05 ENCOUNTER — Ambulatory Visit (INDEPENDENT_AMBULATORY_CARE_PROVIDER_SITE_OTHER): Payer: Medicare Other | Admitting: Emergency Medicine

## 2015-06-05 VITALS — BP 148/84 | HR 82 | Temp 97.9°F | Resp 16 | Ht 69.0 in | Wt 180.0 lb

## 2015-06-05 DIAGNOSIS — M545 Low back pain, unspecified: Secondary | ICD-10-CM

## 2015-06-05 DIAGNOSIS — M549 Dorsalgia, unspecified: Secondary | ICD-10-CM | POA: Diagnosis not present

## 2015-06-05 DIAGNOSIS — G909 Disorder of the autonomic nervous system, unspecified: Secondary | ICD-10-CM | POA: Diagnosis not present

## 2015-06-05 DIAGNOSIS — G8929 Other chronic pain: Secondary | ICD-10-CM

## 2015-06-05 MED ORDER — MORPHINE SULFATE 15 MG PO TABS: 30.0000 mg | ORAL_TABLET | Freq: Four times a day (QID) | ORAL | Status: DC | PRN

## 2015-06-05 MED ORDER — GABAPENTIN 100 MG PO CAPS: ORAL_CAPSULE | ORAL | Status: DC

## 2015-06-05 NOTE — Patient Instructions (Signed)
I had given you an increased amount of pain medication to last you because I will be out in 2 weeks for a mission trip. You have gabapentin to add in addition to your regular dose to help with back pain.

## 2015-06-05 NOTE — Progress Notes (Signed)
By signing my name below, I, Stann Ore, attest that this documentation has been prepared under the direction and in the presence of [name], MD. Electronically Signed: Stann Ore, Scribe. 06/05/2015 , 8:34 AM .  Patient was seen in room 13 .  Chief Complaint:  Chief Complaint  Patient presents with  . Follow-up    Hypertension  . Medication Refill    Morphine    HPI: Jimmy [name] is a 50 y.o. male who reports to [facility] today for follow up.  He's hurting a little more than usual.   HTN Recently, his BP has gone up. He informs that he can feel it right when he's laying down in bed. It shot up from 140s to 180s. He was seen by cardiology yesterday for HTN medication, lisinopril 2.5mg .   Appetite His appetite has gotten better; although, he doesn't know when he's hungry or when he's full.   Medication He also requests medication refill on morphine.  He takes neurontin  q8 hours currently but was wondering about increasing the dose because it feels like his nerves are shooting pain.   Pain Management He has not received any contact from pain management.   Past Medical History  Diagnosis Date  . Back pain   . DDD (degenerative disc disease), cervical   . Spinal stenosis   . Headache     migraines  . Essential hypertension 06/04/2015   Past Surgical History  Procedure Laterality Date  . Back surgery  2002,2009    x 2, lower , job injury, spinal stenosis   Social History   Social History  . Marital Status: Married    Spouse Name: N/A  . Number of Children: 4  . Years of Education: 12   Occupational History  .      disabled   Social History Main Topics  . Smoking status: Former Smoker    Quit date: 05/02/2007  . Smokeless tobacco: Never Used  . Alcohol Use: No  . Drug Use: No  . Sexual Activity: Yes   Other Topics Concern  . None   Social History Narrative   Lives at home with wife, children   Caffeine use- 1 soda a day         Epworth Sleepiness Scale = 4 (as of 03/17/15)   Family History  Problem Relation Age of Onset  . Heart disease Maternal Grandmother   . Hypertension Maternal Grandmother   . Hyperlipidemia Maternal Grandmother   . Cancer Maternal Grandfather   . Hypertension Maternal Grandfather   . Hypertension Mother   . Cancer Mother    Allergies  Allergen Reactions  . Antihistamines, Chlorpheniramine-Type Other (See Comments)    Hallucinations  . Pyridium [Phenazopyridine Hcl] Hives and Other (See Comments)    Dizziness and indigestion   Prior to Admission medications   Medication Sig Start Date End Date Taking? Authorizing Provider  gabapentin (NEURONTIN) 400 MG capsule Take 1 capsule (400 mg total) by mouth 3 (three) times daily. 05/05/15   Collene Gobble, MD  hydrocortisone (CORTEF) 5 MG tablet Take 2 tablets ( ) daily at 7am, take  daily with lunch, and take  daily at 4pm Patient taking differently: Take 5-10 mg by mouth 2 (two) times daily. Take 2 tablets in the morning and 1 at 4pm 01/17/15   Collene Gobble, MD  lisinopril (PRINIVIL,ZESTRIL) 2.5 MG tablet Take 1 tablet (2.5 mg total) by mouth daily. 06/04/15   Dwana Melena, PA-C  meloxicam (  MOBIC) 7.5 MG tablet Take 1 tablet (7.5 mg total) by mouth 2 (two) times daily. 05/05/15   Collene Gobble, MD  morphine (MSIR) 15 MG tablet Take 2 tablets (30 mg total) by mouth every 6 (six) hours as needed for severe pain. 05/21/15   Morrell Riddle, PA-C  polyethylene glycol (MIRALAX / GLYCOLAX) packet Take 17 g by mouth daily as needed for mild constipation.    Historical Provider, MD     ROS:  Constitutional: negative for fever, chills, night sweats, weight changes, or fatigue  HEENT: negative for vision changes, hearing loss, congestion, rhinorrhea, ST, epistaxis, or sinus pressure Cardiovascular: negative for chest pain or palpitations Respiratory: negative for hemoptysis, wheezing, shortness of breath, or cough Abdominal: negative for abdominal  pain, nausea, vomiting, diarrhea, or constipation Dermatological: negative for rash Neurologic: negative for headache, dizziness, or syncope All other systems reviewed and are otherwise negative with the exception to those above and in the HPI.  PHYSICAL EXAM: Filed Vitals:   06/05/15 0816  BP: 148/84  Pulse: 82  Temp: 97.9 F (36.6 C)  Resp: 16   Body mass index is 26.57 kg/(m^2).   General: Alert, no acute distress HEENT:  Normocephalic, atraumatic, oropharynx patent. Eye: [facility] Cardiovascular:  Regular rate and rhythm, no rubs murmurs or gallops.  No Carotid bruits, radial pulse intact. No pedal edema.  Respiratory: Clear to auscultation bilaterally.  No wheezes, rales, or rhonchi.  No cyanosis, no use of accessory musculature Abdominal: Mild RLQ abd tenderness, No organomegaly, abdomen is soft, positive bowel sounds. No masses. Musculoskeletal: Gait intact. No edema, tenderness Skin: No rashes. Neurologic: Facial musculature symmetric. Psychiatric: Patient acts appropriately throughout our interaction.  Lymphatic: No cervical or submandibular lymphadenopathy Genitourinary/Anorectal: No acute findings  LABS:   EKG/XRAY:   Primary read interpreted by Dr. Cleta Alberts at [facility].   ASSESSMENT/PLAN:  His pain medication will be filled 20 days this time as I will be out of country for 2 weeks. I gave him a prescription for 100 mg gabapentin that he can add to his usual dose of 400 mg to see if that helps with the radicular type pain he is expressing in his back. He was also advised he could take extra 2.5 mg lisinopril when his BP increases at night.   Gross sideeffects, risk and benefits, and alternatives of medications d/w patient. Patient is aware that all medications have potential sideeffects and we are unable to predict every sideeffect or drug-drug interaction that may occur.  [name] MD 06/05/2015 8:34 AM

## 2015-06-07 ENCOUNTER — Telehealth: Payer: Self-pay | Admitting: *Deleted

## 2015-06-07 NOTE — Telephone Encounter (Signed)
[facility] is still behind on referral.  Melanie in our referrals will try to get pt into somewhere else.

## 2015-06-08 ENCOUNTER — Ambulatory Visit: Payer: Medicare Other | Admitting: Diagnostic Neuroimaging

## 2015-06-09 ENCOUNTER — Encounter: Payer: Self-pay | Admitting: Diagnostic Neuroimaging

## 2015-06-12 ENCOUNTER — Telehealth: Payer: Self-pay | Admitting: Internal Medicine

## 2015-06-12 NOTE — Telephone Encounter (Signed)
On-Call Cardiology Telephone Note  I was contacted by the patient's wife, who reports that Jimmy [name] has experienced orthostatic hypotension and intermittent low blood pressures over the last week SBP < 90 upon standing; currently 111/75 when seated).  He has not passed out.  He is currently taking lisinopril 2.5 mg daily.  I advised her to have Jimmy [name] stop taking lisinopril and to increase his fluid intake.  The patient should contact Jimmy [name] office on Monday for further instructions.  I will also forward this message to Jimmy [name] and Jimmy [name], who last saw the patient on 06/04/15.  Jimmy Kendall, MD Cardiology Fellow

## 2015-06-17 ENCOUNTER — Ambulatory Visit: Payer: Medicare Other | Admitting: Pharmacist Clinician (PhC)/ Clinical Pharmacy Specialist

## 2015-06-26 ENCOUNTER — Ambulatory Visit (INDEPENDENT_AMBULATORY_CARE_PROVIDER_SITE_OTHER): Payer: Medicare Other | Admitting: Emergency Medicine

## 2015-06-26 VITALS — BP 124/70 | HR 80 | Temp 98.7°F | Resp 16 | Ht 69.0 in | Wt 178.0 lb

## 2015-06-26 DIAGNOSIS — I951 Orthostatic hypotension: Secondary | ICD-10-CM | POA: Diagnosis not present

## 2015-06-26 DIAGNOSIS — G8929 Other chronic pain: Secondary | ICD-10-CM | POA: Diagnosis not present

## 2015-06-26 DIAGNOSIS — M549 Dorsalgia, unspecified: Secondary | ICD-10-CM

## 2015-06-26 DIAGNOSIS — G909 Disorder of the autonomic nervous system, unspecified: Secondary | ICD-10-CM | POA: Diagnosis not present

## 2015-06-26 DIAGNOSIS — M545 Low back pain, unspecified: Secondary | ICD-10-CM

## 2015-06-26 MED ORDER — GABAPENTIN 400 MG PO CAPS: 400.0000 mg | ORAL_CAPSULE | Freq: Three times a day (TID) | ORAL | Status: DC

## 2015-06-26 MED ORDER — MORPHINE SULFATE 15 MG PO TABS: 30.0000 mg | ORAL_TABLET | Freq: Four times a day (QID) | ORAL | Status: DC | PRN

## 2015-06-26 MED ORDER — HYDROCORTISONE 5 MG PO TABS: 5.0000 mg | ORAL_TABLET | Freq: Two times a day (BID) | ORAL | Status: DC

## 2015-06-26 MED ORDER — GABAPENTIN 100 MG PO CAPS: ORAL_CAPSULE | ORAL | Status: DC

## 2015-06-26 NOTE — Progress Notes (Addendum)
Patient ID: Jimmy [name], male   DOB: Feb 01, 1966, 50 y.o.   MRN: [id number]    This chart was scribed for Earl Lites, MD by [facility], medical scribe at [facility].The patient was seen in exam room 10 and the patient's care was started at 8:20 AM.  Chief Complaint:  Chief Complaint  Patient presents with  . Follow-up    medication check    HPI: Jimmy [name] is a 50 y.o. male who reports to [facility] today for a medication recheck. BP has fluctuated, this morning home reading was 147/83. Medications have been adjusted primarily by neurology and cardiology. Rescheduling an appointment with pain management due to the recent passing of his mother. Mother's funeral today. She passed due to pancreatic cancer 10 days ago. Need refills on gabapentin, hydrocortisone and morphine he has had worsening pain and numbness in his lower extremities..   Past Medical History  Diagnosis Date  . Back pain   . DDD (degenerative disc disease), cervical   . Spinal stenosis   . Headache     migraines  . Essential hypertension 06/04/2015   Past Surgical History  Procedure Laterality Date  . Back surgery  2002,2009    x 2, lower , job injury, spinal stenosis   Social History   Social History  . Marital Status: Married    Spouse Name: N/A  . Number of Children: 4  . Years of Education: 12   Occupational History  .      disabled   Social History Main Topics  . Smoking status: Former Smoker    Quit date: 05/02/2007  . Smokeless tobacco: Never Used  . Alcohol Use: No  . Drug Use: No  . Sexual Activity: Yes   Other Topics Concern  . None   Social History Narrative   Lives at home with wife, children   Caffeine use- 1 soda a day         Epworth Sleepiness Scale = 4 (as of 03/17/15)   Family History  Problem Relation Age of Onset  . Heart disease Maternal Grandmother   . Hypertension Maternal Grandmother   . Hyperlipidemia Maternal Grandmother   . Cancer Maternal  Grandfather   . Hypertension Maternal Grandfather   . Hypertension Mother   . Cancer Mother    Allergies  Allergen Reactions  . Antihistamines, Chlorpheniramine-Type Other (See Comments)    Hallucinations  . Pyridium [Phenazopyridine Hcl] Hives and Other (See Comments)    Dizziness and indigestion   Prior to Admission medications   Medication Sig Start Date End Date Taking? Authorizing Provider  Docusate Sodium (COLACE PO) Take by mouth.   Yes Historical Provider, MD  gabapentin (NEURONTIN) 100 MG capsule Take 1-2 capsules in addition to your regular gabapentin if you are experiencing increasing back pain. 06/05/15  Yes Collene Gobble, MD  gabapentin (NEURONTIN) 400 MG capsule Take 1 capsule (400 mg total) by mouth 3 (three) times daily. 05/05/15  Yes Collene Gobble, MD  hydrocortisone (CORTEF) 5 MG tablet Take 2 tablets (10mg ) daily at 7am, take 5mg  daily with lunch, and take 5mg  daily at 4pm Patient taking differently: Take 5-10 mg by mouth 2 (two) times daily. Take 2 tablets in the morning and 1 at 4pm 01/17/15  Yes Collene Gobble, MD  meloxicam (MOBIC) 7.5 MG tablet Take 1 tablet (7.5 mg total) by mouth 2 (two) times daily. 05/05/15  Yes Collene Gobble, MD  morphine (MSIR) 15 MG tablet  Take 2 tablets (30 mg total) by mouth every 6 (six) hours as needed for severe pain. 06/05/15  Yes Collene Gobble, MD  polyethylene glycol (MIRALAX / GLYCOLAX) packet Take 17 g by mouth daily as needed for mild constipation. Reported on 06/26/2015    Historical Provider, MD   ROS: The patient denies fevers, chills, night sweats, unintentional weight loss, chest pain, palpitations, wheezing, dyspnea on exertion, nausea, vomiting, abdominal pain, dysuria, hematuria, melena, numbness, weakness, or tingling.  All other systems have been reviewed and were otherwise negative with the exception of those mentioned in the HPI and as above.    PHYSICAL EXAM: Filed Vitals:   06/26/15 0814  BP: 124/70  Pulse: 80  Temp: 98.7  F (37.1 C)  Resp: 16   Body mass index is 26.27 kg/(m^2).  General: Alert, depressed appearing but in no acute distress. HEENT:  Normocephalic, atraumatic, oropharynx patent. Eye: [facility] Cardiovascular:  Regular rate and rhythm, no rubs murmurs or gallops.  No Carotid bruits, radial pulse intact. No pedal edema.  Respiratory: Clear to auscultation bilaterally.  No wheezes, rales, or rhonchi.  No cyanosis, no use of accessory musculature Abdominal: No organomegaly, abdomen is soft and non-tender, positive bowel sounds.  No masses. Musculoskeletal: Gait intact. No edema, tenderness Skin: No rashes. Neurologic: Facial musculature symmetric. There are no reflexes in the knees or ankles. There is tenderness to touch over both feet. Vibratory sensation is absent as well as fine touch involving the feet. He does have vibratory sensation and fine touch maintained in the calf area. Psychiatric: Patient acts appropriately throughout our interaction. Lymphatic: No cervical or submandibular lymphadenopathy Genitourinary/Anorectal: No acute findings  LABS: Results for orders placed or performed during the hospital encounter of 05/31/15  Basic metabolic panel  Result Value Ref Range   Sodium 144 135 - 145 mmol/L   Potassium 3.5 3.5 - 5.1 mmol/L   Chloride 105 101 - 111 mmol/L   CO2 29 22 - 32 mmol/L   Glucose, Bld 93 65 - 99 mg/dL   BUN 17 6 - 20 mg/dL   Creatinine, Ser 4.09 0.61 - 1.24 mg/dL   Calcium 9.0 8.9 - 81.1 mg/dL   GFR calc non Af Amer >60 >60 mL/min   GFR calc Af Amer >60 >60 mL/min   Anion gap 10 5 - 15  CBC  Result Value Ref Range   WBC 4.6 4.0 - 10.5 K/uL   RBC 3.95 (L) 4.22 - 5.81 MIL/uL   Hemoglobin 13.0 13.0 - 17.0 g/dL   HCT 91.4 (L) 78.2 - 95.6 %   MCV 96.7 78.0 - 100.0 fL   MCH 32.9 26.0 - 34.0 pg   MCHC 34.0 30.0 - 36.0 g/dL   RDW 21.3 08.6 - 57.8 %   Platelets 148 (L) 150 - 400 K/uL  I-stat troponin, ED (not at [facility], [facility])  Result Value Ref Range    Troponin i, poc 0.00 0.00 - 0.08 ng/mL   Comment 3           ASSESSMENT/PLAN: All meds refilled. He will continue on the same dose of Neurontin and morphine as well as hydrocortisone. He has a follow-up appointment Dr. Lucrezia Starch who is the primary doctor for his autonomic dysfunction. He also has a cardiologist he sees regularly. He missed his appointment with the pain clinic and we'll try and reschedule that. He has progressive neuropathy involving his legs. We'll try and adjust small increments of increasing Neurontin 100 mg to add  to his baseline dose and see if this gives him some relief. Gross sideeffects, risk and benefits, and alternatives of medications d/w patient. Patient is aware that all medications have potential sideeffects and we are unable to predict every sideeffect or drug-drug interaction that may occur.  By signing my name below, I, Nadim Abuhashem, attest that this documentation has been prepared under the direction and in the presence of Earl Lites, MD.  Electronically Signed: Conchita Paris, medical scribe. 06/26/2015, 8:26 AM.  [name] MD 06/26/2015 8:19 AM

## 2015-07-12 ENCOUNTER — Telehealth: Payer: Self-pay | Admitting: Diagnostic Neuroimaging

## 2015-07-12 ENCOUNTER — Encounter: Payer: Self-pay | Admitting: Diagnostic Neuroimaging

## 2015-07-12 ENCOUNTER — Ambulatory Visit (INDEPENDENT_AMBULATORY_CARE_PROVIDER_SITE_OTHER): Payer: Medicare Other | Admitting: Diagnostic Neuroimaging

## 2015-07-12 VITALS — Wt 187.4 lb

## 2015-07-12 DIAGNOSIS — G909 Disorder of the autonomic nervous system, unspecified: Secondary | ICD-10-CM | POA: Diagnosis not present

## 2015-07-12 DIAGNOSIS — G6181 Chronic inflammatory demyelinating polyneuritis: Secondary | ICD-10-CM

## 2015-07-12 DIAGNOSIS — I951 Orthostatic hypotension: Secondary | ICD-10-CM | POA: Diagnosis not present

## 2015-07-12 NOTE — Patient Instructions (Signed)
May increase gabapentin to 600mg  three times per day.  Will try to get IVIG setup through home health, [facility] short stay or hospital admission.

## 2015-07-12 NOTE — Progress Notes (Signed)
[facility]  PATIENT: Jimmy [name] [name] DOB: 12-01-1965  REFERRING CLINICIAN: Daub, S HISTORY FROM: patient and wife  REASON FOR VISIT: follow up    HISTORICAL  CHIEF COMPLAINT:  Chief Complaint  Patient presents with  . Orthostatic hypertension    rm 7  . Follow-up    3 month, rescheduled from Feb    HISTORY OF PRESENT ILLNESS:   UPDATE 07/12/15: Since last visit, now off droxidopa. BP more stable (98/57 - 149/82). Now able to walk without walker for short distances. Unfortunately, IVIG not covered in outpatient infusion center. Numbness / pain stable, but slightly controlled with gabapentin. Lightheadedness still worse in evening.   UPDATE 03/08/15: Since last visit, now on northera 100mg  TID. Some supine hypertension (max SBP 183/99; but rare). Now with new onset of numbness in hands x 1 month, and numbness in toes since Aug 2016.   UPDATE 02/10/15: Since last visit, no new events. Still with orthostatic hypotension and lightheadedness and dizziness. Also struggling with severe low back pain radiating to the legs. Still on florinef.   PRIOR HPI (01/11/15): 50 year old left-handed male here for evaluation of orthostatic hypotension, dysautonomia. In June patient had left C1 herpes zoster ophthalmicus, treated with antivirals therapy. In July patient began to have intermittent episodes of hypotension, dizziness, urinary retention and ultimately leading to syncope. Patient was evaluated at the hospital 3 times including most recent admission with discharge on 12/23/2014. During this time he has been diagnosed with prostatitis, mild renal insufficiency, postural orthostatic tachycardia syndrome. He was tried on Midodrine and Florinef, and ultimately discharged on Florinef plus hydrocortisone. Had problems with urinary sxs with midodrine. Outpatient urology follow-up is pending. For prostatitis he was discharged on anti-biotics to complete a 6 week course. He also has  urology follow-up for urinary retention. Patient also set up for follow-up in my neurology clinic today. Patient still having significant problems standing and walking. He also has chronic low back pain, history of spinal stenosis status post surgery. Patient has had back surgeries in 2002 and 2009. He has chronic pain syndrome. Is on long-term narcotic medications.   REVIEW OF SYSTEMS: Full 14 system review of systems performed and notable only for blurred vision passing out (x 2 in last 3 weeks) chest pain.   ALLERGIES: Allergies  Allergen Reactions  . Antihistamines, Chlorpheniramine-Type Other (See Comments)    Hallucinations  . Pyridium [Phenazopyridine Hcl] Hives and Other (See Comments)    Dizziness and indigestion    HOME MEDICATIONS: Outpatient Prescriptions Prior to Visit  Medication Sig Dispense Refill  . Docusate Sodium (COLACE PO) Take by mouth.    . gabapentin (NEURONTIN) 100 MG capsule Take 1-2 capsules in addition to your regular gabapentin if you are experiencing increasing back pain. 90 capsule 3  . gabapentin (NEURONTIN) 400 MG capsule Take 1 capsule (400 mg total) by mouth 3 (three) times daily. 90 capsule 1  . hydrocortisone (CORTEF) 5 MG tablet Take 1-2 tablets (5-10 mg total) by mouth 2 (two) times daily. Take 2 tablets in the morning and 1 at 4pm 120 tablet 3  . meloxicam (MOBIC) 7.5 MG tablet Take 1 tablet (7.5 mg total) by mouth 2 (two) times daily. 60 tablet 1  . morphine (MSIR) 15 MG tablet Take 2 tablets (30 mg total) by mouth every 6 (six) hours as needed for severe pain. 160 tablet 0  . polyethylene glycol (MIRALAX / GLYCOLAX) packet Take 17 g by mouth daily as needed for mild constipation. Reported on  06/26/2015     No facility-administered medications prior to visit.    PAST MEDICAL HISTORY: Past Medical History  Diagnosis Date  . Back pain   . DDD (degenerative disc disease), cervical   . Spinal stenosis   . Headache     migraines  . Essential  hypertension 06/04/2015    PAST SURGICAL HISTORY: Past Surgical History  Procedure Laterality Date  . Back surgery  2002,2009    x 2, lower , job injury, spinal stenosis    FAMILY HISTORY: Family History  Problem Relation Age of Onset  . Heart disease Maternal Grandmother   . Hypertension Maternal Grandmother   . Hyperlipidemia Maternal Grandmother   . Cancer Maternal Grandfather   . Hypertension Maternal Grandfather   . Hypertension Mother   . Cancer Mother     SOCIAL HISTORY:  Social History   Social History  . Marital Status: Married    Spouse Name: Jimmy [name]  . Number of Children: 4  . Years of Education: 12   Occupational History  .      disabled   Social History Main Topics  . Smoking status: Former Smoker    Quit date: 05/02/2007  . Smokeless tobacco: Never Used  . Alcohol Use: No  . Drug Use: No  . Sexual Activity: Yes   Other Topics Concern  . Not on file   Social History Narrative   Lives at home with wife, children   Caffeine use- 1 soda a day         Epworth Sleepiness Scale = 4 (as of 03/17/15)     PHYSICAL EXAM  GENERAL EXAM/CONSTITUTIONAL: Vitals:  Filed Vitals:   07/12/15 0950  Weight: 187 lb 6.4 oz (85.004 kg)    Body mass index is 27.66 kg/(m^2). No exam data present  Patient is in no distress; well developed, nourished and groomed; neck is supple   CARDIOVASCULAR:  Examination of carotid arteries is normal; no carotid bruits  Regular rate and rhythm, no murmurs  Examination of peripheral vascular system by observation and palpation is normal  EYES:  Ophthalmoscopic exam of optic discs and posterior segments is normal; no papilledema or hemorrhages  MUSCULOSKELETAL:  Gait, strength, tone, movements noted in Neurologic exam below  NEUROLOGIC: MENTAL STATUS:  No flowsheet data found.  awake, alert, oriented to person, place and time  recent and remote memory intact  normal attention and concentration  language  fluent, comprehension intact, naming intact,   fund of knowledge appropriate  CRANIAL NERVE:   2nd, 3rd, 4th, 6th - pupils equal and reactive to light, visual fields full to confrontation, extraocular muscles intact, no nystagmus  5th - facial sensation symmetric  7th - facial strength symmetric  8th - hearing intact  9th - palate elevates symmetrically, uvula midline  11th - shoulder shrug symmetric  12th - tongue protrusion midline  MOTOR:   normal bulk and tone; BUE 4; BLE (HF 4, KE/KF 4, DF 4)  SENSORY:   normal and symmetric to light touch, temperature, vibration; ABSENT VIB AT TOES AND ANKLES  COORDINATION:   finger-nose-finger, fine finger movements SLOW  REFLEXES:   deep tendon reflexes TRACE and symmetric; ABSENT AT KNEE AND ANKLES  GAIT/STATION:   narrow based gait; ABLE TO WALK WITHOUT ASSISTANCE    DIAGNOSTIC DATA (LABS, IMAGING, TESTING) - I reviewed patient records, labs, notes, testing and imaging myself where available.  Lab Results  Component Value Date   WBC 4.6 06/01/2015   HGB 13.0 06/01/2015  HCT 38.2* 06/01/2015   MCV 96.7 06/01/2015   PLT 148* 06/01/2015      Component Value Date/Time   NA 144 06/01/2015 0001   K 3.5 06/01/2015 0001   CL 105 06/01/2015 0001   CO2 29 06/01/2015 0001   GLUCOSE 93 06/01/2015 0001   BUN 17 06/01/2015 0001   BUN 8 02/22/2015   CREATININE 0.96 06/01/2015 0001   CREATININE 0.76 03/22/2015 1028   CREATININE 0.8 02/22/2015   CALCIUM 9.0 06/01/2015 0001   PROT 6.8 02/01/2015 0633   ALBUMIN 3.7 02/01/2015 0633   AST 20 02/01/2015 0633   ALT 14* 02/01/2015 0633   ALKPHOS 74 02/01/2015 0633   BILITOT 1.1 02/01/2015 0633   GFRNONAA >60 06/01/2015 0001   GFRNONAA >89 03/22/2015 1028   GFRAA >60 06/01/2015 0001   GFRAA >89 03/22/2015 1028   No results found for: CHOL, HDL, LDLCALC, LDLDIRECT, TRIG, CHOLHDL Lab Results  Component Value Date   HGBA1C 4.9 03/22/2015   Lab Results  Component Value  Date   VITAMINB12 378 12/22/2014   Lab Results  Component Value Date   TSH 1.145 12/04/2014    12/17/14 CT head [I reviewed images myself and agree with interpretation. -VRP]  - negative   12/08/14 MRI right hip - Negative examination. There is no evidence of septic hip. No abnormality to explain the patient's symptoms is identified.   11/30/14 MRI lumbar spine  1. Overall stable appearance of the lumbar spine with sequelae of prior decompressive laminectomy at L4-5. No recurrent stenosis at this level. 2. Congenital spinal stenosis with superimposed mild multilevel degenerative changes as above, overall relatively similar to prior MRI from 2011. No severe canal stenosis or evidence of cord compression. 3. Distended urinary bladder.  02/08/15 MRI brain  - normal  01/27/15 MRI cervical spine (with and without) demonstrating: 1. At C4-5: leftward disc bulging with severe left foraminal stenosis 2. At C5-6: uncovertebral joint hypertrophy with moderate left foraminal stenosis  3. At C6-7: uncovertebral joint hypertrophy and facet hypertrophy with mild right and moderate left foraminal stenosis  4. No intrinsic spinal cord lesions.  01/27/15 MRI thoracic spine (with and without) demonstrating: 1. At T3-4: right uncovertebral joint hypertrophy with moderate right foraminal stenosis  2. At T8-9: disc bulging with no spinal stenosis or foraminal narrowing  3. No intrinsic or compressive spinal cord lesions.   04/22/15 EMG/NCS - Widespread sensorimotor polyneuropathy with axonal and demyelinating features. Active and chronic denervation changes noted on each needle EMG. Given the clinical context, considerations include immune neuropathies (CIDP, anti-sulfatide, anti-MAG, anti-GM1, monoclonal gammopathy), hereditary neuropathies (CMT, HMSN), infectious neuropathies (HIV) or metabolic causes (diabetes, hypothyroidism, liver disease).  05/06/15 CSF (WBC 1, RBC 1, protein 110, glucose  62)      ASSESSMENT AND PLAN  50 y.o. year old male here with history of lumbar spinal stenosis, status post decompression surgery, chronic pain, now with progressive medical and neurologic decline since June 2016. Patient had left V1 herpes zoster ophthalmicus in June 2016 followed by progressive urinary retention, dizziness and now orthostatic intolerance/hypotension. 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 Some spontaneous improvement since past couple weeks (strength better, orthostatic tolerance better).  Localization: autonomic nervous system  Dx: neurodegenerative pure autonomic failure vs post-viral dysautonomia vs immune-mediated neuropathy (CIDP)  CIDP (chronic inflammatory demyelinating polyneuropathy) (HCC)  Orthostatic hypotension  Pure autonomic failure    PLAN: - continue hydrocortisone - will try to setup IVIG through home health or hospital  Return in about 3 months (around  10/12/2015).    Suanne Marker, MD 07/12/2015, 10:33 AM Certified in Neurology, Neurophysiology and Neuroimaging  [facility] [address] [place], [place] [address] [phone]

## 2015-07-12 NOTE — Telephone Encounter (Signed)
Advised pt that he has $82.84 balance in collections that would need to be paid before making follow-up appt.  Pt advised he could not pay today but would contact later to pay.

## 2015-07-15 ENCOUNTER — Ambulatory Visit (INDEPENDENT_AMBULATORY_CARE_PROVIDER_SITE_OTHER): Payer: Medicare Other | Admitting: Urgent Care

## 2015-07-15 VITALS — BP 118/68 | HR 76 | Temp 98.1°F | Resp 16 | Ht 69.0 in | Wt 187.4 lb

## 2015-07-15 DIAGNOSIS — G909 Disorder of the autonomic nervous system, unspecified: Secondary | ICD-10-CM | POA: Diagnosis not present

## 2015-07-15 DIAGNOSIS — M549 Dorsalgia, unspecified: Secondary | ICD-10-CM | POA: Diagnosis not present

## 2015-07-15 DIAGNOSIS — M545 Low back pain, unspecified: Secondary | ICD-10-CM

## 2015-07-15 DIAGNOSIS — G8929 Other chronic pain: Secondary | ICD-10-CM | POA: Diagnosis not present

## 2015-07-15 MED ORDER — MORPHINE SULFATE 15 MG PO TABS: 30.0000 mg | ORAL_TABLET | Freq: Four times a day (QID) | ORAL | Status: DC | PRN

## 2015-07-15 NOTE — Progress Notes (Signed)
    MRN: [id number] DOB: 1966-01-13  Subjective:   Jimmy [name] is a 50 y.o. male presenting for chief complaint of Medication Refill  Patient is presenting for refill of his chronic back pain medication, morphine 15mg . He has regular f/u with Dr. Cleta [name] and is also being seen by Dr. Marjory [name], a neurologist that has been working with him since 2016. Patient is s/p decompression surgery for his back. His case is complicated by autonomic dysfunction. Reports compliance with his pain medication. Denies overuse, constipation, breathing issues. [place] Database checks out.  Jimmy [name] has a current medication list which includes the following prescription(s): docusate sodium, gabapentin, gabapentin, hydrocortisone, meloxicam, morphine, and polyethylene glycol. Also is allergic to antihistamines, chlorpheniramine-type and pyridium.  Jimmy [name]  has a past medical history of Back pain; DDD (degenerative disc disease), cervical; Spinal stenosis; Headache; and Essential hypertension (06/04/2015). Also  has past surgical history that includes Back surgery (2002,2009).  Objective:   Vitals: BP 118/68 mmHg  Pulse 76  Temp(Src) 98.1 F (36.7 C) (Oral)  Resp 16  Ht 5\' 9"  (1.753 m)  Wt 187 lb 6.4 oz (85.004 kg)  BMI 27.66 kg/m2  SpO2 98%  Physical Exam  Constitutional: He is oriented to person, place, and time. He appears well-developed and well-nourished.  HENT:  Mouth/Throat: Oropharynx is clear and moist.  Eyes: Pupils are equal, round, and reactive to light. No scleral icterus.  Cardiovascular: Normal rate, regular rhythm and intact distal pulses.  Exam reveals no gallop and no friction rub.   No murmur heard. Pulmonary/Chest: No respiratory distress. He has no wheezes. He has no rales.  Neurological: He is alert and oriented to person, place, and time.  Skin: Skin is warm and dry.   Assessment and Plan :   1. Back pain at L4-L5 level 2. Back pain, chronic 3. Autonomic dysfunction - Recommended  f/u with Dr. Cleta [name]. Given compliance as seen on [place] [facility], I refilled his pain medication. Reviewed potential for adverse effects, patient verbalized understanding.  Jimmy BambergMario Rockell Faulks, PA-C [name] [phone] 07/15/2015 9:26 AM

## 2015-07-15 NOTE — Progress Notes (Signed)
Thank you Kathlene NovemberMike. You should look at his record. He has autonomic dysfunction felt to be secondary to an autoimmune disorder and will be receiving immunoglobulin.

## 2015-07-15 NOTE — Patient Instructions (Addendum)
     IF you received an x-ray today, you will receive an invoice from [facility]. Please contact [facility] at [phone] with questions or concerns regarding your invoice.   IF you received labwork today, you will receive an invoice from [facility]. Please contact [facility] at [phone] with questions or concerns regarding your invoice.   Our billing staff will not be able to assist you with questions regarding bills from these companies.  You will be contacted with the lab results as soon as they are available. The fastest way to get your results is to activate your My Chart account. Instructions are located on the last page of this paperwork. If you have not heard from us regarding the results in 2 weeks, please contact this office.     [facility] Policy for Prescribing Controlled Substances (Revised 03/2012) 1. Prescriptions for controlled substances will be filled by ONE provider at [facility] with whom you have established and developed a plan for your care, including follow-up. 2. You are encouraged to schedule an appointment with your prescriber at our appointment center for follow-up visits whenever possible. 3. If you request a prescription for the controlled substance while at [facility] for an acute problem (with someone other than your regular prescriber), you MAY be given a ONE-TIME prescription for a 30-day supply of the controlled substance, to allow time for you to return to see your regular prescriber for additional prescriptions.  

## 2015-07-21 ENCOUNTER — Ambulatory Visit (INDEPENDENT_AMBULATORY_CARE_PROVIDER_SITE_OTHER): Payer: Medicare Other | Admitting: Internal Medicine

## 2015-07-21 ENCOUNTER — Encounter: Payer: Self-pay | Admitting: Internal Medicine

## 2015-07-21 VITALS — BP 104/60 | HR 80 | Temp 97.4°F | Resp 12 | Wt 196.0 lb

## 2015-07-21 DIAGNOSIS — E2740 Unspecified adrenocortical insufficiency: Secondary | ICD-10-CM

## 2015-07-21 DIAGNOSIS — R7989 Other specified abnormal findings of blood chemistry: Secondary | ICD-10-CM

## 2015-07-21 NOTE — Progress Notes (Signed)
Patient ID: Jimmy [name], male   DOB: Feb 06, 1966, 50 y.o.   MRN: [id number]   HPI  Jimmy [name] is a 50 y.o.-year-old male, returning for f/u for low cortisol level (suspicion for adrenal insufficiency). He is here with his wife who offers most of the history. Last visit 6 months ago.  Since last visit, he saw neurology >> dx'ed with autonomic dysfunction. He also had a slightly increased proteins in CSF  - believed to be related to his neuropathy. A suggestion for tx has been made >> insurance denied the med.  Reviewed history: Pt was dx with shingles in 09/2014 >> tx with Zovirax (no steroids) >> lower AP, dizziness and urinary retention - in need of bladder catheterization >> developed prostatitis >> ED visit >> sent home >> urinary retention and pain increased >> came back to the hospital >> admitted >> low cortisol >> started Florinef 0.1 mg daily and Midodrine 5 mg tid >> saw PCP post d/c >> orthostatic hypotension >> admitted again >> urinary retention worse >> stopped Midodrine and increased Florinef to 0.3 mg daily. During this admission a cosyntropin stimulation test was negative. He was started on Hydrocortisone pending further workup.  Upon questioning, a diagnosis of POTS was also entertained by the admitting team while in the hospital.   Pt. has been found to have a low cortisol level summer 2016 (pt also had a positive UDS on 12/04/2014 >> he was on opiates then): Component     Latest Ref Rng 12/08/2014 (2:52 pm) 12/10/2014 (5:16 AM)   Cortisol - AM     6.7 - 22.6 ug/dL 5.0 (L)  4.5 (L)   He then had a cosyntropin stimulation test while in the hospital >> normal: Component     Latest Ref Rng 12/18/2014 (7:30 AM)   Cortisol, Base      4.4  Cortisol, 30 Min      17.6  Cortisol, 60 Min      23.4   An MRI of his pituitary (02/09/2015) returned normal.  At last visit, we treated him as an adrenal insufficiency case and continued hydrocortisone, however, we decreased his  Hydrocortisone dose to 10 mg in am, 5 mg in pm. He is feeling well on this dose. Of note, he did not feel a big difference in how he was feeling before or after hydrocortisone.  He is also on Florinef 0.3 mg daily in am - as he had dizziness and orthostatic hypotension. No more "passing out" after increasing the dose of Florinef. He developed hypertension >> Fludrocortisone tapered off.   He has severe back pain (spinal stenosis and degenerative disk ds) - disability since 2013 - 2 surgeries - steroid inj before last surgery in 2009. He is on  On Morphine IR 60 mg 4x a day  No h/o Megace, po ketoconazole, phenytoin, rifampin, chronic fluconazole use. + h/o autoimmune diseases in family mbs: DM1 in great grandparents (?) No h/o generalized infections or HIV. No IVDA. No h/o head injury or severe HA. No h/o malignancy.  + h/o hyponatremia, but no hyperkalemia.   Chemistry      Component Value Date/Time   NA 144 06/01/2015 0001   K 3.5 06/01/2015 0001   CL 105 06/01/2015 0001   CO2 29 06/01/2015 0001   BUN 17 06/01/2015 0001   BUN 8 02/22/2015   CREATININE 0.96 06/01/2015 0001   CREATININE 0.76 03/22/2015 1028   CREATININE 0.8 02/22/2015      Component Value Date/Time  CALCIUM 9.0 06/01/2015 0001   ALKPHOS 74 02/01/2015 0633   AST 20 02/01/2015 0633   ALT 14* 02/01/2015 0633   BILITOT 1.1 02/01/2015 16100633      I reviewed his previous sodium level, and they were low along with a urine that was not maximally diluted: Component     Latest Ref Rng 10/21/2014 11/29/2014 11/30/2014 12/02/2014  Sodium     135 - 145 mmol/L 134 (L) 124 (L) 127 (L) 134 (L)  Potassium     3.5 - 5.1 mmol/L 3.3 (L) 3.4 (L) 3.9 3.7  Osmolality, Ur     390 - 1090 mOsm/kg   140 (L)    A TSH was normal: Lab Results  Component Value Date   TSH 1.145 12/04/2014   I reviewed his chart and he also has a history of cluster HAs (resolved). He also has an elevated hemoglobin A1c, 6.5%. He has GERD and anemia.  He  increased neurontin since last visit.  ROS: Constitutional: + see HPI + nocturia, + cold intolerance Eyes: + blurry vision, no xerophthalmia ENT: no sore throat, no nodules palpated in throat, + dysphagia/no odynophagia, no hoarseness, + tinnitus, + hypoacusis Cardiovascular: no CP/+ SOB/+ palpitations/no leg swelling Respiratory: no cough/+ SOB Gastrointestinal: + N/no V/D/C Musculoskeletal: + muscle aches/+ joint aches Skin: no rashes Neurological: no tremors/numbness/tingling/+ dizziness  I reviewed pt's medications, allergies, PMH, social hx, family hx, and changes were documented in the history of present illness. Otherwise, unchanged from my initial visit note.  Past Medical History  Diagnosis Date  . Back pain   . DDD (degenerative disc disease), cervical   . Spinal stenosis   . Headache     migraines  . Essential hypertension 06/04/2015   Past Surgical History  Procedure Laterality Date  . Back surgery  2002,2009    x 2, lower , job injury, spinal stenosis   Social History   Social History  . Marital Status: Married    Spouse Name: [name]  . Number of Children: 4  . Years of Education: 12   Occupational History  .      disabled   Social History Main Topics  . Smoking status: Former Smoker    Quit date: 05/02/2007  . Smokeless tobacco: Never Used  . Alcohol Use: No  . Drug Use: No  . Sexual Activity: Yes   Other Topics Concern  . Not on file   Social History Narrative   Lives at home with wife, children   Caffeine use- 1 soda a day         Epworth Sleepiness Scale = 4 (as of 03/17/15)   Current Outpatient Prescriptions on File Prior to Visit  Medication Sig Dispense Refill  . Docusate Sodium (COLACE PO) Take by mouth.    . gabapentin (NEURONTIN) 100 MG capsule Take 1-2 capsules in addition to your regular gabapentin if you are experiencing increasing back pain. 90 capsule 3  . gabapentin (NEURONTIN) 400 MG capsule Take 1 capsule (400 mg total) by mouth  3 (three) times daily. 90 capsule 1  . hydrocortisone (CORTEF) 5 MG tablet Take 1-2 tablets (5-10 mg total) by mouth 2 (two) times daily. Take 2 tablets in the morning and 1 at 4pm 120 tablet 3  . meloxicam (MOBIC) 7.5 MG tablet Take 1 tablet (7.5 mg total) by mouth 2 (two) times daily. 60 tablet 1  . morphine (MSIR) 15 MG tablet Take 2 tablets (30 mg total) by mouth every 6 (six) hours  as needed for severe pain. 160 tablet 0  . polyethylene glycol (MIRALAX / GLYCOLAX) packet Take 17 g by mouth daily as needed for mild constipation. Reported on 06/26/2015     No current facility-administered medications on file prior to visit.   Allergies  Allergen Reactions  . Antihistamines, Chlorpheniramine-Type Other (See Comments)    Hallucinations  . Pyridium [Phenazopyridine Hcl] Hives and Other (See Comments)    Dizziness and indigestion   Family History  Problem Relation Age of Onset  . Heart disease Maternal Grandmother   . Hypertension Maternal Grandmother   . Hyperlipidemia Maternal Grandmother   . Cancer Maternal Grandfather   . Hypertension Maternal Grandfather   . Hypertension Mother   . Cancer Mother    PE: BP 104/60 mmHg  Pulse 80  Temp(Src) 97.4 F (36.3 C) (Oral)  Resp 12  Wt 196 lb (88.905 kg)  SpO2 98% Body mass index is 28.93 kg/(m^2). Wt Readings from Last 3 Encounters:  07/21/15 196 lb (88.905 kg)  07/15/15 187 lb 6.4 oz (85.004 kg)  07/12/15 187 lb 6.4 oz (85.004 kg)   Constitutional: normal weight, in NAD, lying down on the examiner's table due to back pain  Eyes: PERRLA, EOMI, no exophthalmos ENT: moist mucous membranes, no thyromegaly, no cervical lymphadenopathy Cardiovascular: RRR, No MRG Respiratory: CTA B Gastrointestinal: abdomen soft, NT, ND, BS+ Musculoskeletal: no deformities, strength intact in all 4 Skin: moist, warm, no rashes; no dark discoloration of skin Neurological: + tremor with outstretched hands, DTR 1/5 in all 4  ASSESSMENT: 1. Low  cortisol level - ? Adrenal insufficiency - brain MRI (02/09/2015) >> no pituitary tumor present.  A diagnosis of adrenal insufficiency is not clear for him: There are several indicators point towards patient not having adrenal insufficiency:  His cortisol was low, however, this was drawn while patient was on opiates.  His dizziness/orthostatic hypotension did not significantly improve after starting hydrocortisone. If these signs/symptoms were related to adrenal insufficiency, patient would have felt a great improvement from hydrocortisone. There are several indicators that point towards patient having adrenal insufficiency:  Weight loss  Dizziness/orthostatic hypotension - continues  SIADH-like picture during his hospitalization last fall  History of steroid use  Opiate use  PLAN:  1. Low cortisol level - pt's cortisol levels were low, however, they were drawn after starting opiate medication. He does continue opiates now, 4x a day. His adrenal function per se appeared to be normal, based on the normal stimulation test from 11/2014.   We again discussed that In this case, 2 scenarios are possible:  A. he does not have adrenal insufficiency  B. he has newly developed central adrenal insufficiency  - it is very difficult to differentiate between the above possibilities, however, if he had normal stimulation test due to new central adrenal insufficiency in 11/2014, this is most likely progress to primary adrenal insufficiency by now, so a new cosyntropin stimulation test would probably give [place] a good answer. We will schedule this, and I advised him to skip the her cortisone dose in the morning of the test and that p.m. of the day before the test. He is also to skip morphine IR in the morning of the test We will plan to check this at 8 am >> will return for this.  - for now, will continue with his hydrocortisone treatment, until we have a clear diagnosis. If you stimulation test returns  normal, we may need to taper down or even off his hydrocortisone -  at last visit, we decreased his dose of hydrocortisone from 3 times a day to just 2 times a day: 10 mg in am and 5 mg in pm - reinforced sick days rules:  If you cannot keep anything down, including your hydrocortisone medication, please go to the emergency room or your primary care physician office to get steroids injected in the muscle or vein. Alternatively, you can inject 100 mg hydrocortisone in the muscle at home.  If you have a fever (more than 100 Fahrenheit) or gastroenteritis with nausea/vomiting and diarrhea, please double the dose of your hydrocortisone for the duration of the fever or the gastroenteritis.  Do not run out of your hydrocortisone medication. - advised for MedAlert bracelet mentioning "adrenal insufficiency" >> he did not get one yet - I will see the patient back in 6 months  Orders Placed This Encounter  Procedures  . ACTH  . Cortisol  . Cortisol  . Cortisol   Component     Latest Ref Rng 07/23/2015 07/23/2015 07/23/2015         8:32 AM  9:03 AM  9:29 AM  Cortisol, Plasma      12.2 14.4 17.0   The stimulation test is still abnormal (normal >> 18) >> mild adrenal insufficiency  >> we need to continue the current dose of hydrocortisone for now.

## 2015-07-21 NOTE — Patient Instructions (Addendum)
Please continue Hydrocortisone 10 mg in am and 5 mg in pm.  Skip the pm dose of hydrocortisone and the am dose of Morphine IR when you come for labs. Come at 8 am, fasting.  Please come back for a follow-up appointment in 6 months.   - You absolutely need to take this medication every day and not skip doses. - Please double the dose if you have a fever, for the duration of the fever. - If you cannot take anything by mouth (vomiting) or you have severe diarrhea so that you eliminate the hydrocortisone pills in your stool, please make sure that you get the hydrocortisone in the vein instead - go to the nearest emergency department/urgent care or you may go to your PCPs office  - Please try to get a MedAlert bracelet or pendant indicating: "Adrenal insufficiency".

## 2015-07-23 ENCOUNTER — Other Ambulatory Visit (INDEPENDENT_AMBULATORY_CARE_PROVIDER_SITE_OTHER): Payer: Medicare Other

## 2015-07-23 DIAGNOSIS — E2740 Unspecified adrenocortical insufficiency: Secondary | ICD-10-CM | POA: Diagnosis not present

## 2015-07-23 DIAGNOSIS — R7989 Other specified abnormal findings of blood chemistry: Secondary | ICD-10-CM

## 2015-07-23 LAB — CORTISOL
Cortisol, Plasma: 12.2 ug/dL
Cortisol, Plasma: 14.4 ug/dL
Cortisol, Plasma: 17 ug/dL

## 2015-07-23 MED ORDER — COSYNTROPIN 0.25 MG IJ SOLR
0.2500 mg | Freq: Once | INTRAMUSCULAR | Status: AC
  Administered 2015-07-23: 0.25 mg via INTRAMUSCULAR

## 2015-07-23 NOTE — Addendum Note (Signed)
Addended by: Adline MangoSTONE-ELMORE, Aymar Whitfill I on: 07/23/2015 09:29 AM   Modules accepted: Orders

## 2015-07-23 NOTE — Addendum Note (Signed)
Addended by: Adline MangoSTONE-ELMORE, Jaylend Reiland I on: 07/23/2015 09:03 AM   Modules accepted: Orders

## 2015-07-26 LAB — ACTH: C206 ACTH: 66 pg/mL — ABNORMAL HIGH (ref 6–50)

## 2015-07-27 ENCOUNTER — Telehealth: Payer: Self-pay | Admitting: Diagnostic Neuroimaging

## 2015-07-27 NOTE — Telephone Encounter (Signed)
Spoke with wife, [name] and informed her that Dewayne Hatchnn with [facility] called to state that she has attempted to reach patient to set up HH IVIG, but she has not gotten a call back. Darrol JumpGave Eva [name]'s contact information. She stated she would follow up on this call. She verbalized understanding, appreciation for call.

## 2015-07-27 NOTE — Telephone Encounter (Signed)
Jimmy [name] with [facility] [phone] called said she can't get in touch with pt. She said he has Medicare A & B, she cannot locate D. She said he can get certain policies that will cover part D and will cover his 20% but will probably not start until January unless he is low income. She can not get in touch with him, he will not return her call. She said if you could get in touch with him and have him call her, she can help him.

## 2015-08-06 ENCOUNTER — Encounter: Payer: Self-pay | Admitting: Emergency Medicine

## 2015-08-06 ENCOUNTER — Ambulatory Visit (INDEPENDENT_AMBULATORY_CARE_PROVIDER_SITE_OTHER): Payer: Medicare Other | Admitting: Emergency Medicine

## 2015-08-06 VITALS — BP 116/74 | HR 74 | Temp 98.1°F | Resp 16 | Ht 69.5 in | Wt 202.0 lb

## 2015-08-06 DIAGNOSIS — G8929 Other chronic pain: Secondary | ICD-10-CM

## 2015-08-06 DIAGNOSIS — R634 Abnormal weight loss: Secondary | ICD-10-CM

## 2015-08-06 DIAGNOSIS — M545 Low back pain, unspecified: Secondary | ICD-10-CM

## 2015-08-06 DIAGNOSIS — G909 Disorder of the autonomic nervous system, unspecified: Secondary | ICD-10-CM

## 2015-08-06 DIAGNOSIS — M549 Dorsalgia, unspecified: Secondary | ICD-10-CM | POA: Diagnosis not present

## 2015-08-06 MED ORDER — MORPHINE SULFATE 15 MG PO TABS: 30.0000 mg | ORAL_TABLET | Freq: Four times a day (QID) | ORAL | Status: DC | PRN

## 2015-08-06 MED ORDER — GABAPENTIN 100 MG PO CAPS: ORAL_CAPSULE | ORAL | Status: DC

## 2015-08-06 MED ORDER — MELOXICAM 7.5 MG PO TABS: 7.5000 mg | ORAL_TABLET | Freq: Two times a day (BID) | ORAL | Status: DC

## 2015-08-06 NOTE — Patient Instructions (Signed)
Please contact pain management in so we can arrange follow-up. Recheck prior to your prescriptions run out.

## 2015-08-06 NOTE — Progress Notes (Addendum)
Patient ID: Jimmy [name], male   DOB: 05-01-1966, 50 y.o.   MRN: [id number]     By signing my name below, I, Littie Deeds, attest that this documentation has been prepared under the direction and in the presence of [name], MD.  Electronically Signed: Littie Deeds, Medical Scribe. 08/06/2015. 8:30 AM.   Chief Complaint:  Chief Complaint  Patient presents with  . Follow-up    Medications    HPI: Jimmy [name] is a 50 y.o. male with a history of chronic lower back pain, cervical DDD, and spinal stenosis who reports to [facility] today for a follow-up. He has autonomic dysfunction which results in very labile blood pressure. He is under the care of cardiologist, endocrinologist, and neurologist. He is currently managed here regarding his pain medications for severe back discomfort following back surgery. He recently had a visit with his neurologist and his endocrinologist. He has not had any recent changes to his medications. Patient rates his back pain as a 6-7/10 in severity. The gabapentin has been helping with his symptoms. He takes a 400 mg gabapentin with an additional 1-2 100 mg tablets, 3 times a day. He has been eating and making bowel movements normally. He takes Colace and an occasional Miralax. Patient was contacted by pain management, but this was around the time his mother died so he hasn't made an appointment yet. Patient notes his blood pressure has been running lower than normal, noticed mostly at night. He is not on any blood pressure medications.  Wt Readings from Last 3 Encounters:  08/06/15 202 lb (91.627 kg)  07/21/15 196 lb (88.905 kg)  07/15/15 187 lb 6.4 oz (85.004 kg)    Past Medical History  Diagnosis Date  . Back pain   . DDD (degenerative disc disease), cervical   . Spinal stenosis   . Headache     migraines  . Essential hypertension 06/04/2015   Past Surgical History  Procedure Laterality Date  . Back surgery  2002,2009    x 2, lower , job injury, spinal  stenosis   Social History   Social History  . Marital Status: Married    Spouse Name: [name]  . Number of Children: 4  . Years of Education: 12   Occupational History  .      disabled   Social History Main Topics  . Smoking status: Former Smoker    Quit date: 05/02/2007  . Smokeless tobacco: Never Used  . Alcohol Use: No  . Drug Use: No  . Sexual Activity: Yes   Other Topics Concern  . None   Social History Narrative   Lives at home with wife, children   Caffeine use- 1 soda a day         Epworth Sleepiness Scale = 4 (as of 03/17/15)   Family History  Problem Relation Age of Onset  . Heart disease Maternal Grandmother   . Hypertension Maternal Grandmother   . Hyperlipidemia Maternal Grandmother   . Cancer Maternal Grandfather   . Hypertension Maternal Grandfather   . Hypertension Mother   . Cancer Mother    Allergies  Allergen Reactions  . Reglan [Metoclopramide] Other (See Comments)    Altered mental status  . Antihistamines, Chlorpheniramine-Type Other (See Comments)    Hallucinations  . Pyridium [Phenazopyridine Hcl] Hives and Other (See Comments)    Dizziness and indigestion   Prior to Admission medications   Medication Sig Start Date End Date Taking? Authorizing Provider  Docusate Sodium (COLACE PO) Take  by mouth.   Yes Historical Provider, MD  gabapentin (NEURONTIN) 100 MG capsule Take 1-2 capsules in addition to your regular gabapentin if you are experiencing increasing back pain. 06/26/15  Yes Collene GobbleSteven A Aleasha Fregeau, MD  gabapentin (NEURONTIN) 400 MG capsule Take 1 capsule (400 mg total) by mouth 3 (three) times daily. 06/26/15  Yes Collene GobbleSteven A Renetta Suman, MD  hydrocortisone (CORTEF) 5 MG tablet Take 1-2 tablets (5-10 mg total) by mouth 2 (two) times daily. Take 2 tablets in the morning and 1 at 4pm 06/26/15  Yes Collene GobbleSteven A Sulema Braid, MD  meloxicam (MOBIC) 7.5 MG tablet Take 1 tablet (7.5 mg total) by mouth 2 (two) times daily. 05/05/15  Yes Collene GobbleSteven A Shaday Rayborn, MD  morphine (MSIR) 15 MG  tablet Take 2 tablets (30 mg total) by mouth every 6 (six) hours as needed for severe pain. 07/15/15  Yes Wallis BambergMario Mani, PA-C  polyethylene glycol (MIRALAX / GLYCOLAX) packet Take 17 g by mouth daily as needed for mild constipation. Reported on 06/26/2015   Yes Historical Provider, MD     ROS: The patient denies fevers, chills, night sweats, unintentional weight loss, chest pain, palpitations, wheezing, dyspnea on exertion, nausea, vomiting, abdominal pain, dysuria, hematuria, melena, numbness, weakness, or tingling.  All other systems have been reviewed and were otherwise negative with the exception of those mentioned in the HPI and as above.    PHYSICAL EXAM: Filed Vitals:   08/06/15 0825  BP: 116/74  Pulse: 74  Temp: 98.1 F (36.7 C)  Resp: 16   Body mass index is 29.41 kg/(m^2).   General: Alert, no acute distress, cooperative.  HEENT:  Normocephalic, atraumatic, oropharynx patent. Neck: No thyromegaly. Eye: Nonie HoyerOMI, [facility] Cardiovascular:  Regular rate and rhythm, no rubs murmurs or gallops.  No Carotid bruits, radial pulse intact. No pedal edema.  Respiratory: Clear to auscultation bilaterally.  No wheezes, rales, or rhonchi.  No cyanosis, no use of accessory musculature Abdominal: Minimal right lower abdominal discomfort. Musculoskeletal: No edema Skin: No rashes. Neurologic: Facial musculature symmetric. He does walk with a limp, but is able to ambulate without dizziness. Psychiatric: Patient acts appropriately throughout our interaction. Lymphatic: No cervical or submandibular lymphadenopathy    LABS:    EKG/XRAY:   [facility]   ASSESSMENT/PLAN: He is stable at present. No real changes in how he feels. He has low blood pressures in the late afternoon. He is exercising primarily in the morning. Will keep medications the same and re-check before his prescriptions expire. They will call pain management about an appointment for follow-up.I  personally performed the services described in this documentation, which was scribed in my presence. The recorded information has been reviewed and is accurate. Will check a basic metabolic panel and CBC at his next OV.    Gross sideeffects, risk and benefits, and alternatives of medications d/w patient. Patient is aware that all medications have potential sideeffects and we are unable to predict every sideeffect or drug-drug interaction that may occur.  Lesle ChrisSteven Loanne Emery MD 08/06/2015 8:30 AM

## 2015-08-11 ENCOUNTER — Telehealth: Payer: Self-pay | Admitting: *Deleted

## 2015-08-11 NOTE — Telephone Encounter (Signed)
LVM informing wife that Jimmy [name] is trying to reach pt for IVIG, has not been successful; her messages have not been returned. Left her name, number. Called [name]'s number, LVM with same information; requested he call [name].

## 2015-08-13 NOTE — Telephone Encounter (Signed)
LVM for Webb LawsAnn Lowish, [facility] and informed her that this RN LVM for patient and his wife on 08/11/15, was FU with her.

## 2015-08-19 ENCOUNTER — Encounter: Payer: Self-pay | Admitting: *Deleted

## 2015-08-19 NOTE — Telephone Encounter (Signed)
Spoke with Webb LawsAnn Lowish, [facility] to fu on attempting to reach patient re: IVIG. She stated she has not heard back from patient or his wife. Gave her home and mobile #s. She stated she would continue to try and reach him.

## 2015-09-03 ENCOUNTER — Encounter: Payer: Self-pay | Admitting: Emergency Medicine

## 2015-09-03 ENCOUNTER — Ambulatory Visit (INDEPENDENT_AMBULATORY_CARE_PROVIDER_SITE_OTHER): Payer: Medicare Other | Admitting: Emergency Medicine

## 2015-09-03 VITALS — BP 128/76 | HR 71 | Temp 98.7°F | Resp 16 | Ht 69.5 in | Wt 218.0 lb

## 2015-09-03 DIAGNOSIS — G909 Disorder of the autonomic nervous system, unspecified: Secondary | ICD-10-CM

## 2015-09-03 DIAGNOSIS — M545 Low back pain, unspecified: Secondary | ICD-10-CM

## 2015-09-03 DIAGNOSIS — G8929 Other chronic pain: Secondary | ICD-10-CM

## 2015-09-03 DIAGNOSIS — M549 Dorsalgia, unspecified: Secondary | ICD-10-CM | POA: Diagnosis not present

## 2015-09-03 DIAGNOSIS — I951 Orthostatic hypotension: Secondary | ICD-10-CM | POA: Diagnosis not present

## 2015-09-03 MED ORDER — MORPHINE SULFATE 15 MG PO TABS: 30.0000 mg | ORAL_TABLET | Freq: Four times a day (QID) | ORAL | Status: DC | PRN

## 2015-09-03 NOTE — Progress Notes (Signed)
By signing my name below, I, Raven Small, attest that this documentation has been prepared under the direction and in the presence of Lesle ChrisSteven Treyvone Chelf, MD.  Electronically Signed: Andrew Auaven Small, ED Scribe. 09/03/2015. 8:19 AM.  Chief Complaint:  Chief Complaint  Patient presents with  . Follow-up    back pain  . Medication Refill    morphine    HPI: Jimmy [name] is a 50 y.o. male who [facility] for a follow up of back pain. Pt has chronic pain syndrome and antonomic dysfunction associated with adrenal insufficiency. He is followed endocrinology, neurology and cardiology. Per wife for the past few days, pt has had rough days especially with weather change. Pt states back pain is tolerable and denies new changes. He takes hydrocortisone 15 mg 1 in the morning and 1 in the afternoon and mobic twice a day. He is needing refills of morphine 15 mg. He takes 2 tablets every 6 hours which usually last him 3 weeks. He takes colace for abdominal pain and find relief with this medication. Pt is on disability and is currently on food stamps.  He still has not heard from pain management.  Past Medical History  Diagnosis Date  . Back pain   . DDD (degenerative disc disease), cervical   . Spinal stenosis   . Headache     migraines  . Essential hypertension 06/04/2015   Past Surgical History  Procedure Laterality Date  . Back surgery  2002,2009    x 2, lower , job injury, spinal stenosis   Social History   Social History  . Marital Status: Married    Spouse Name: [name]  . Number of Children: 4  . Years of Education: 12   Occupational History  .      disabled   Social History Main Topics  . Smoking status: Former Smoker    Quit date: 05/02/2007  . Smokeless tobacco: Never Used  . Alcohol Use: No  . Drug Use: No  . Sexual Activity: Yes   Other Topics Concern  . None   Social History Narrative   Lives at home with wife, children   Caffeine use- 1 soda a day         Epworth Sleepiness Scale = 4 (as of 03/17/15)   Family History  Problem Relation Age of Onset  . Heart disease Maternal Grandmother   . Hypertension Maternal Grandmother   . Hyperlipidemia Maternal Grandmother   . Cancer Maternal Grandfather   . Hypertension Maternal Grandfather   . Hypertension Mother   . Cancer Mother    Allergies  Allergen Reactions  . Reglan [Metoclopramide] Other (See Comments)    Altered mental status  . Antihistamines, Chlorpheniramine-Type Other (See Comments)    Hallucinations  . Pyridium [Phenazopyridine Hcl] Hives and Other (See Comments)    Dizziness and indigestion   Prior to Admission medications   Medication Sig Start Date End Date Taking? Authorizing Provider  Docusate Sodium (COLACE PO) Take by mouth.   Yes Historical Provider, MD  gabapentin (NEURONTIN) 100 MG capsule Take 1-2 capsules in addition to your regular gabapentin if you are experiencing increasing back pain. 08/06/15  Yes Collene GobbleSteven A Mario Coronado, MD  gabapentin (NEURONTIN) 400 MG capsule Take 1 capsule (400 mg total) by mouth 3 (three) times daily. 06/26/15  Yes Collene GobbleSteven A Gottfried Standish, MD  hydrocortisone (CORTEF) 5 MG tablet Take 1-2 tablets (5-10 mg total) by mouth 2 (two) times daily. Take 2 tablets in the morning  and 1 at 4pm 06/26/15  Yes Collene Gobble, MD  meloxicam (MOBIC) 7.5 MG tablet Take 1 tablet (7.5 mg total) by mouth 2 (two) times daily. 08/06/15  Yes Collene Gobble, MD  morphine (MSIR) 15 MG tablet Take 2 tablets (30 mg total) by mouth every 6 (six) hours as needed for severe pain. 08/06/15  Yes Collene Gobble, MD  polyethylene glycol (MIRALAX / GLYCOLAX) packet Take 17 g by mouth daily as needed for mild constipation. Reported on 06/26/2015   Yes Historical Provider, MD     ROS: The patient denies fevers, chills, night sweats, unintentional weight loss, chest pain, palpitations, wheezing, dyspnea on exertion, nausea, vomiting, abdominal pain, dysuria, hematuria, melena, numbness, weakness, or tingling.     All other systems have been reviewed and were otherwise negative with the exception of those mentioned in the HPI and as above.    PHYSICAL EXAM: Filed Vitals:   09/03/15 0819  BP: 128/76  Pulse: 71  Temp: 98.7 F (37.1 C)  Resp: 16   Body mass index is 31.74 kg/(m^2).   General: Alert, no acute distress HEENT:  Normocephalic, atraumatic, oropharynx patent. Eye: Nonie Hoyer [facility] Cardiovascular:  Regular rate and rhythm, no rubs murmurs or gallops.  No Carotid bruits, radial pulse intact. No pedal edema.  Respiratory: Clear to auscultation bilaterally.  No wheezes, rales, or rhonchi.  No cyanosis, no use of accessory musculature Abdominal: No organomegaly, abdomen is soft and non-tender, positive bowel sounds.  No masses. Musculoskeletal: Gait intact. No edema, tenderness Skin: No rashes. Neurologic: Facial musculature symmetric. Psychiatric: Patient acts appropriately throughout our interaction. Lymphatic: No cervical or submandibular lymphadenopathy   ASSESSMENT/PLAN: Patient is stable. Medication refill. He has enough morphine for 3 weeks. I will see the patient back in 3 weeks.I personally performed the services described in this documentation, which was scribed in my presence. The recorded information has been reviewed and is accurate.I personally performed the services described in this documentation, which was scribed in my presence. The recorded information has been reviewed and is accurate.   Gross sideeffects, risk and benefits, and alternatives of medications d/w patient. Patient is aware that all medications have potential sideeffects and we are unable to predict every sideeffect or drug-drug interaction that may occur.  [name] MD 09/03/2015 8:19 AM

## 2015-09-03 NOTE — Patient Instructions (Addendum)
[facility] Policy for Prescribing Controlled Substances (Revised 03/2012) 1. Prescriptions for controlled substances will be filled by ONE provider at [facility] with whom you have established and developed a plan for your care, including follow-up. 2. You are encouraged to schedule an appointment with your prescriber at our appointment center for follow-up visits whenever possible. 3. If you request a prescription for the controlled substance while at [facility] for an acute problem (with someone other than your regular prescriber), you MAY be given a ONE-TIME prescription for a 30-day supply of the controlled substance, to allow time for you to return to see your regular prescriber for additional prescriptions.     IF you received an x-ray today, you will receive an invoice from [facility]. Please contact [facility] at [phone] with questions or concerns regarding your invoice.   IF you received labwork today, you will receive an invoice from [facility]. Please contact [facility] at [phone] with questions or concerns regarding your invoice.   Our billing staff will not be able to assist you with questions regarding bills from these companies.  You will be contacted with the lab results as soon as they are available. The fastest way to get your results is to activate your My Chart account. Instructions are located on the last page of this paperwork. If you have not heard from us regarding the results in 2 weeks, please contact this office.      

## 2015-09-07 ENCOUNTER — Telehealth: Payer: Self-pay

## 2015-09-07 NOTE — Telephone Encounter (Signed)
Dr. Cleta Albertsaub,  It looks like patient was referred to [facility] but they took two months to get back with us about a denial for pt. They didn't think they could help patient was the reason for the denial.  Pt was [facility] and we never heard from them thinking pt had been scheduled. I apologize for the confusion I have sent pt's records to [facility] today. I will check on progress to make sure pt is scheduled with someone.

## 2015-09-10 ENCOUNTER — Other Ambulatory Visit: Payer: Self-pay | Admitting: Emergency Medicine

## 2015-09-22 DIAGNOSIS — G894 Chronic pain syndrome: Secondary | ICD-10-CM | POA: Diagnosis not present

## 2015-09-22 DIAGNOSIS — M545 Low back pain: Secondary | ICD-10-CM | POA: Diagnosis not present

## 2015-09-22 DIAGNOSIS — E669 Obesity, unspecified: Secondary | ICD-10-CM | POA: Diagnosis not present

## 2015-09-22 DIAGNOSIS — M1288 Other specific arthropathies, not elsewhere classified, other specified site: Secondary | ICD-10-CM | POA: Diagnosis not present

## 2015-09-22 DIAGNOSIS — M79606 Pain in leg, unspecified: Secondary | ICD-10-CM | POA: Diagnosis not present

## 2015-09-24 ENCOUNTER — Ambulatory Visit (INDEPENDENT_AMBULATORY_CARE_PROVIDER_SITE_OTHER): Payer: Medicare Other | Admitting: Emergency Medicine

## 2015-09-24 VITALS — BP 118/62 | HR 91 | Temp 98.1°F | Resp 16 | Ht 69.5 in | Wt 226.0 lb

## 2015-09-24 DIAGNOSIS — M545 Low back pain, unspecified: Secondary | ICD-10-CM

## 2015-09-24 DIAGNOSIS — M549 Dorsalgia, unspecified: Secondary | ICD-10-CM | POA: Diagnosis not present

## 2015-09-24 DIAGNOSIS — I951 Orthostatic hypotension: Secondary | ICD-10-CM | POA: Diagnosis not present

## 2015-09-24 DIAGNOSIS — G909 Disorder of the autonomic nervous system, unspecified: Secondary | ICD-10-CM | POA: Diagnosis not present

## 2015-09-24 DIAGNOSIS — G8929 Other chronic pain: Secondary | ICD-10-CM | POA: Diagnosis not present

## 2015-09-24 MED ORDER — MORPHINE SULFATE 15 MG PO TABS: 30.0000 mg | ORAL_TABLET | Freq: Four times a day (QID) | ORAL | Status: DC | PRN

## 2015-09-24 MED ORDER — GABAPENTIN 300 MG PO CAPS: ORAL_CAPSULE | ORAL | Status: AC

## 2015-09-24 NOTE — Progress Notes (Signed)
By signing my name below, I, Raven Small, attest that this documentation has been prepared under the direction and in the presence of Lesle ChrisSteven Dyane Broberg, MD.  Electronically Signed: Andrew Auaven Small, ED Scribe. 09/24/2015. 9:06 AM.   Chief Complaint:  Chief Complaint  Patient presents with  . Follow-up    chronic pain, refill on Morphine     HPI: Jimmy [name] is a 50 y.o. male who [facility] complaining of follow up. Pt reports having a poor experience at his recent visit at [facility]. He states he had trouble answering some of their questions, not allowing his wife in the room who knew most of the answers. He also reports worsening pain due to the testing performed during that visit such as bending forward to touch his toes . Pt would like to change prescription of morphine to 30mg   due to affordable cost.     Past Medical History  Diagnosis Date  . Back pain   . DDD (degenerative disc disease), cervical   . Spinal stenosis   . Headache     migraines  . Essential hypertension 06/04/2015   Past Surgical History  Procedure Laterality Date  . Back surgery  2002,2009    x 2, lower , job injury, spinal stenosis   Social History   Social History  . Marital Status: Married    Spouse Name: [name]  . Number of Children: 4  . Years of Education: 12   Occupational History  .      disabled   Social History Main Topics  . Smoking status: Former Smoker    Quit date: 05/02/2007  . Smokeless tobacco: Never Used  . Alcohol Use: No  . Drug Use: No  . Sexual Activity: Yes   Other Topics Concern  . None   Social History Narrative   Lives at home with wife, children   Caffeine use- 1 soda a day         Epworth Sleepiness Scale = 4 (as of 03/17/15)   Family History  Problem Relation Age of Onset  . Heart disease Maternal Grandmother   . Hypertension Maternal Grandmother   . Hyperlipidemia Maternal Grandmother   . Cancer Maternal Grandfather   .  Hypertension Maternal Grandfather   . Hypertension Mother   . Cancer Mother    Allergies  Allergen Reactions  . Reglan [Metoclopramide] Other (See Comments)    Altered mental status  . Antihistamines, Chlorpheniramine-Type Other (See Comments)    Hallucinations  . Pyridium [Phenazopyridine Hcl] Hives and Other (See Comments)    Dizziness and indigestion   Prior to Admission medications   Medication Sig Start Date End Date Taking? Authorizing Provider  Docusate Sodium (COLACE PO) Take by mouth.   Yes Historical Provider, MD  gabapentin (NEURONTIN) 100 MG capsule Take 1-2 capsules in addition to your regular gabapentin if you are experiencing increasing back pain. 08/06/15  Yes Collene GobbleSteven A Natha Guin, MD  gabapentin (NEURONTIN) 400 MG capsule TAKE ONE CAPSULE BY MOUTH THREE TIMES DAILY 09/10/15  Yes Collene GobbleSteven A Sherial Ebrahim, MD  hydrocortisone (CORTEF) 5 MG tablet Take 1-2 tablets (5-10 mg total) by mouth 2 (two) times daily. Take 2 tablets in the morning and 1 at 4pm 06/26/15  Yes Collene GobbleSteven A Gabreille Dardis, MD  meloxicam (MOBIC) 7.5 MG tablet Take 1 tablet (7.5 mg total) by mouth 2 (two) times daily. 08/06/15  Yes Collene GobbleSteven A Maily Debarge, MD  morphine (MSIR) 15 MG tablet Take 2 tablets (30 mg  total) by mouth every 6 (six) hours as needed for severe pain. 09/03/15  Yes Collene Gobble, MD  polyethylene glycol (MIRALAX / GLYCOLAX) packet Take 17 g by mouth daily as needed for mild constipation. Reported on 06/26/2015   Yes Historical Provider, MD     ROS: The patient denies fevers, chills, night sweats, unintentional weight loss, chest pain, palpitations, wheezing, dyspnea on exertion, nausea, vomiting, dysuria, hematuria, melena, numbness, weakness, or tingling.   All other systems have been reviewed and were otherwise negative with the exception of those mentioned in the HPI and as above.    PHYSICAL EXAM: Filed Vitals:   09/24/15 0826  BP: 118/62  Pulse: 91  Temp: 98.1 F (36.7 C)  Resp: 16   Body mass index is 32.91  kg/(m^2).   General: Alert, no acute distress HEENT:  Normocephalic, atraumatic, oropharynx patent. Eye: Nonie Hoyer [facility] Cardiovascular:  Regular rate and rhythm, no rubs murmurs or gallops.  No Carotid bruits, radial pulse intact. No pedal edema.  Respiratory: Clear to auscultation bilaterally.  No wheezes, rales, or rhonchi.  No cyanosis, no use of accessory musculature Abdominal: No organomegaly, abdomen is soft and non-tender, positive bowel sounds.  No masses.persistent tenderness right lower abdomen.  Musculoskeletal: Gait intact. No edema, tenderness. Appears comfortable in sitting position. Skin: No rashes. Neurologic: Facial musculature symmetric. Psychiatric: Patient acts appropriately throughout our interaction. Lymphatic: No cervical or submandibular lymphadenopathy  ASSESSMENT/PLAN: The interaction with [facility] did not go well. Would try different pain management group. I did refill his morphine. We change his Neurontin to be on a 300 mg capsules to take to 3 times a day which is total dosage he has been on.I personally performed the services described in this documentation, which was scribed in my presence. The recorded information has been reviewed and is accurate.   Gross sideeffects, risk and benefits, and alternatives of medications d/w patient. Patient is aware that all medications have potential sideeffects and we are unable to predict every sideeffect or drug-drug interaction that may occur.  [name] MD 09/24/2015 9:04 AM

## 2015-09-24 NOTE — Patient Instructions (Signed)
     IF you received an x-ray today, you will receive an invoice from [facility]. Please contact [facility] at [phone] with questions or concerns regarding your invoice.   IF you received labwork today, you will receive an invoice from [facility]. Please contact [facility] at [phone] with questions or concerns regarding your invoice.   Our billing staff will not be able to assist you with questions regarding bills from these companies.  You will be contacted with the lab results as soon as they are available. The fastest way to get your results is to activate your My Chart account. Instructions are located on the last page of this paperwork. If you have not heard from us regarding the results in 2 weeks, please contact this office.      

## 2015-10-01 ENCOUNTER — Encounter: Payer: Self-pay | Admitting: Cardiovascular Disease

## 2015-10-01 ENCOUNTER — Ambulatory Visit (INDEPENDENT_AMBULATORY_CARE_PROVIDER_SITE_OTHER): Payer: Medicare Other | Admitting: Cardiovascular Disease

## 2015-10-01 VITALS — BP 118/70 | HR 80 | Ht 71.0 in | Wt 229.4 lb

## 2015-10-01 DIAGNOSIS — G909 Disorder of the autonomic nervous system, unspecified: Secondary | ICD-10-CM | POA: Diagnosis not present

## 2015-10-01 DIAGNOSIS — I951 Orthostatic hypotension: Secondary | ICD-10-CM

## 2015-10-01 DIAGNOSIS — R600 Localized edema: Secondary | ICD-10-CM | POA: Diagnosis not present

## 2015-10-01 HISTORY — DX: Disorder of the autonomic nervous system, unspecified: G90.9

## 2015-10-01 IMAGING — CT CT ABD-PELV W/ CM
2 of 5 series · 16 of 46 positions shown, 18 images · IV contrast (Omni 300)
Comparison: 12/02/2014

CLINICAL DATA: Generalized abdominal pain with nausea and vomiting
since yesterday.

EXAM:
CT ABDOMEN AND PELVIS WITH CONTRAST
TECHNIQUE: Multidetector CT imaging of the abdomen and pelvis was performed
using the standard protocol following bolus administration of
intravenous contrast.
CONTRAST:  100mL OMNIPAQUE IOHEXOL 300 MG/ML  SOLN

[Series 3: a/p w/ 5mm · axial · 0.71mm/px · z∈[-387,+48]mm · 13 of 99 slices shown, 15 images]
[im 6/99  soft-tissue]
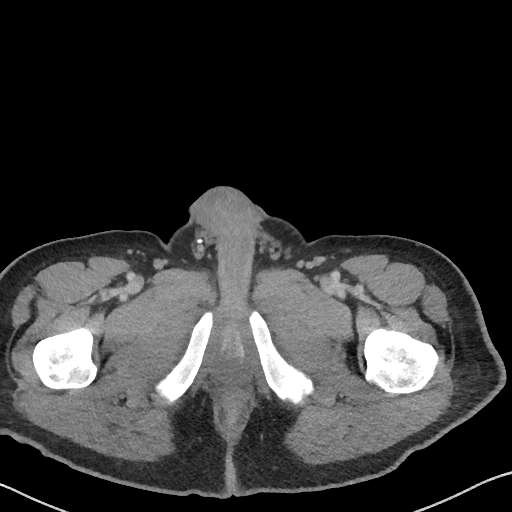
[im 6/99  bone]
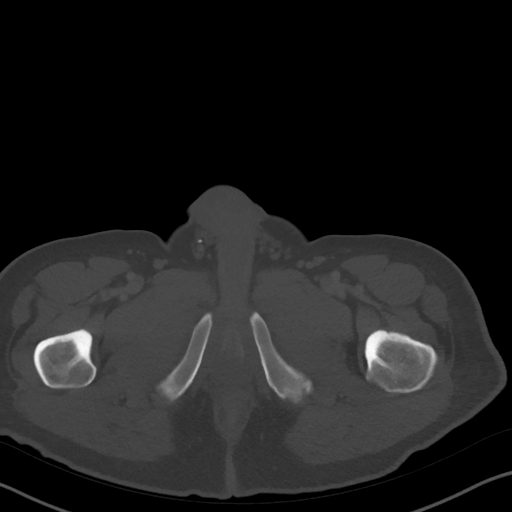
[im 16/99  soft-tissue]
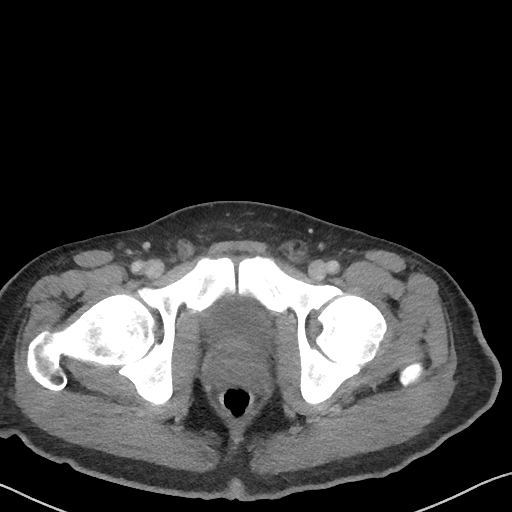
[im 21/99  soft-tissue]
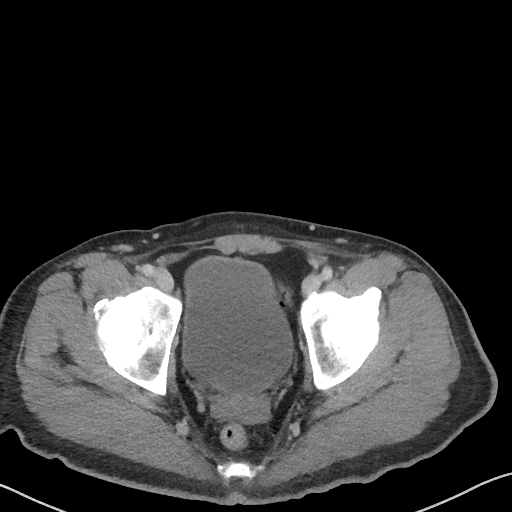
[im 26/99  soft-tissue]
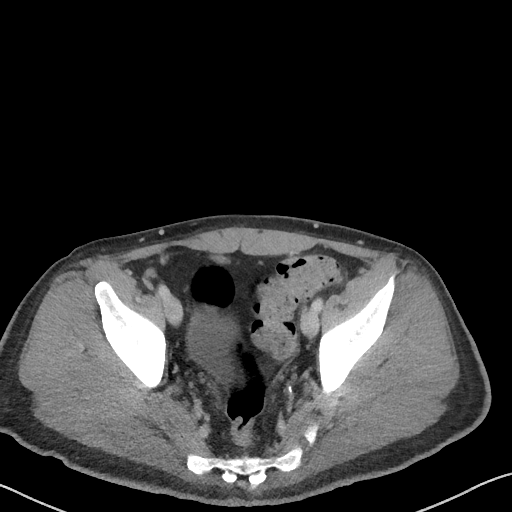
[im 37/99  soft-tissue]
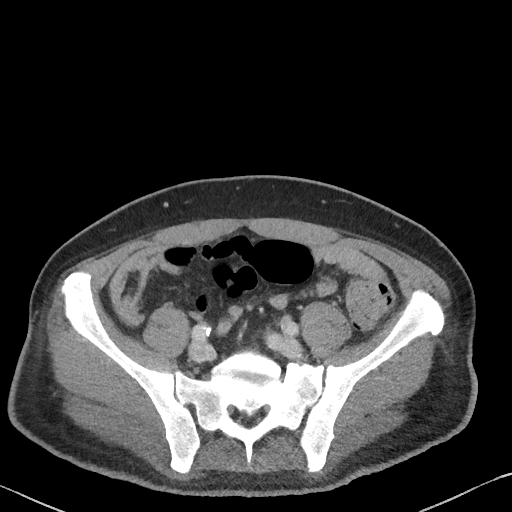
[im 42/99  soft-tissue]
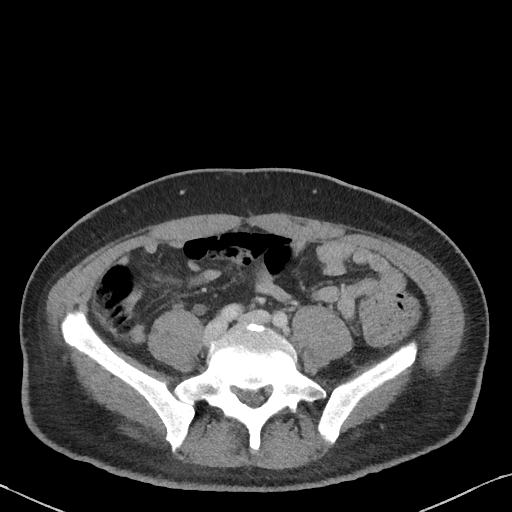
[im 52/99  soft-tissue]
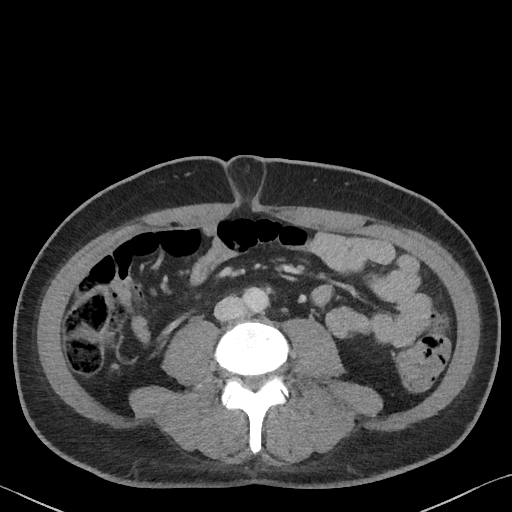
[im 57/99  soft-tissue]
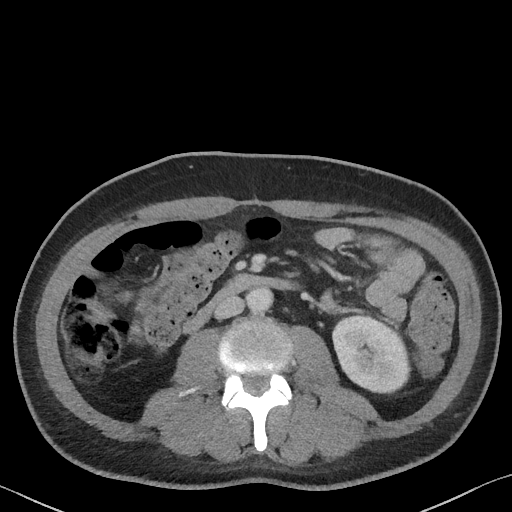
[im 62/99  soft-tissue]
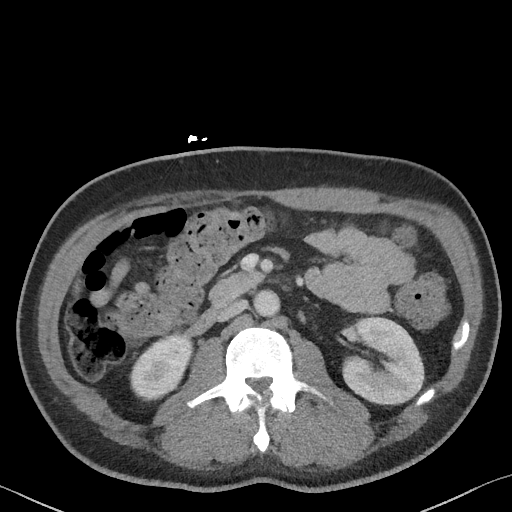
[im 62/99  bone]
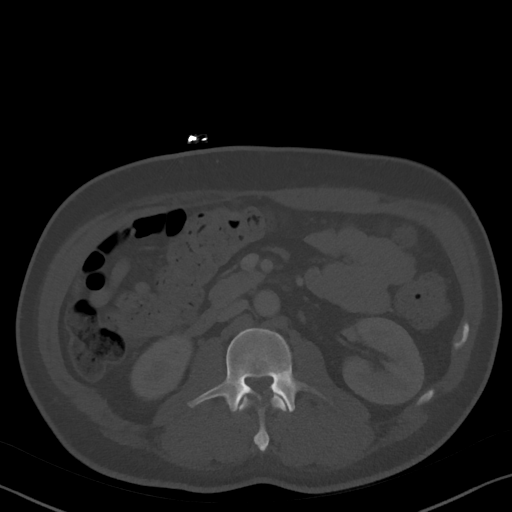
[im 73/99  soft-tissue]
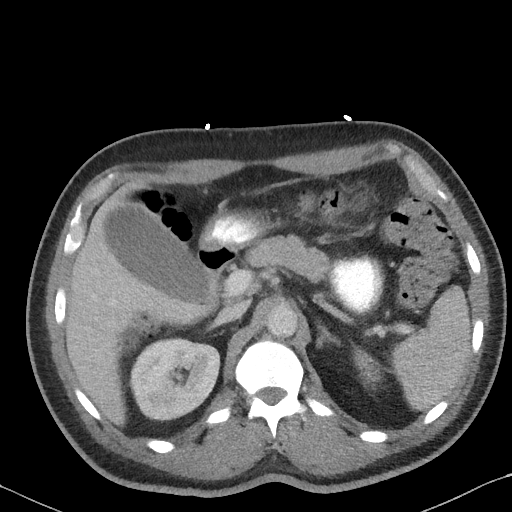
[im 78/99  soft-tissue]
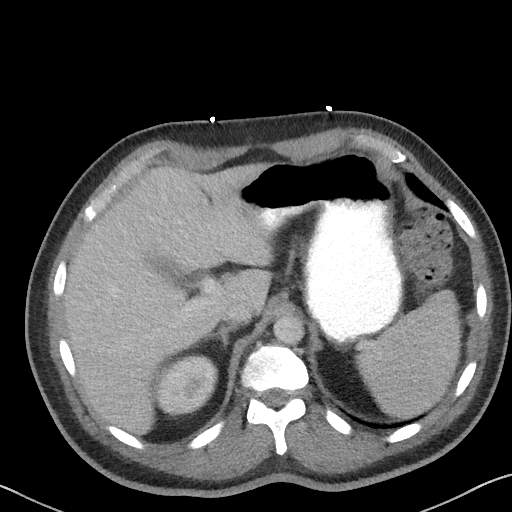
[im 83/99  soft-tissue]
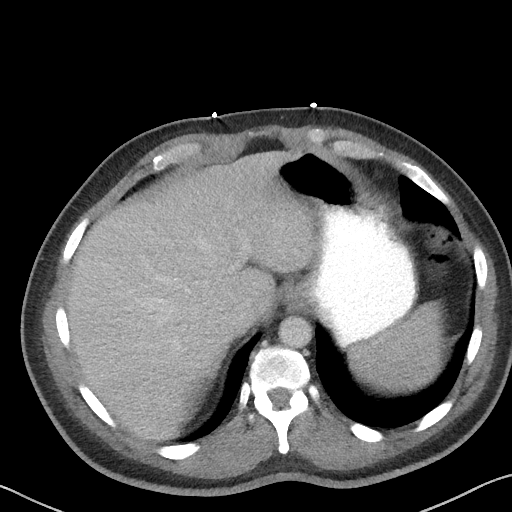
[im 93/99  soft-tissue]
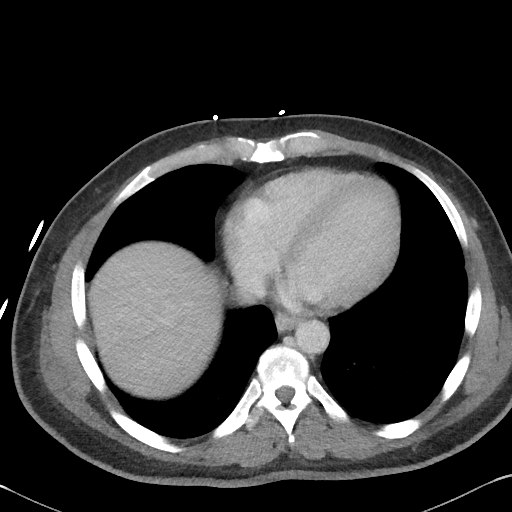

[Series 6: a/p w/ cor · coronal · 0.78mm/px · 3 of 141 slices shown]
[im 47/141  soft-tissue]
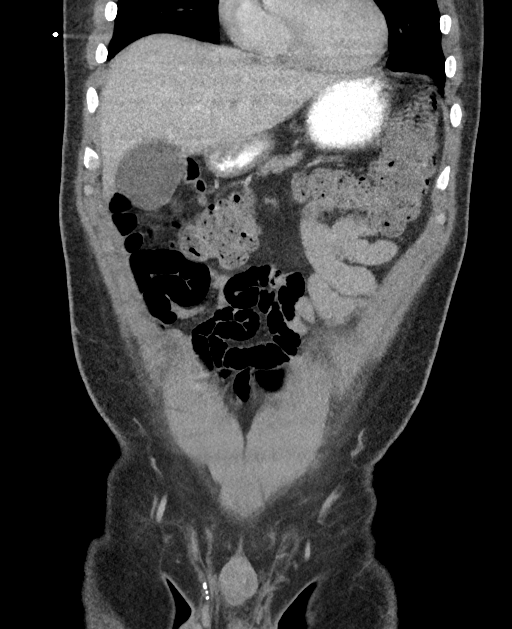
[im 63/141  soft-tissue]
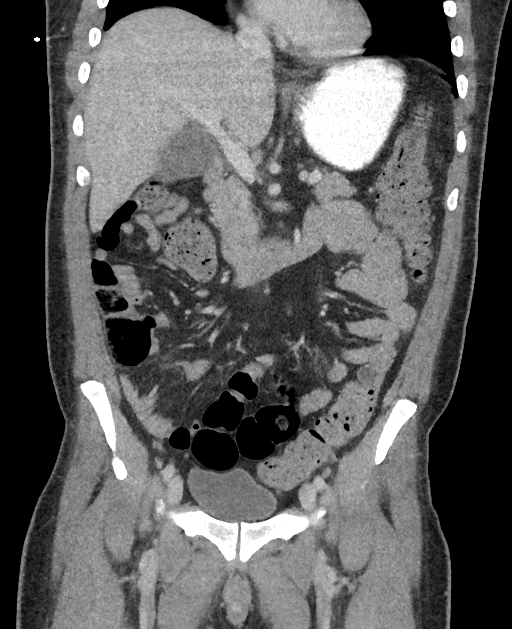
[im 78/141  soft-tissue]
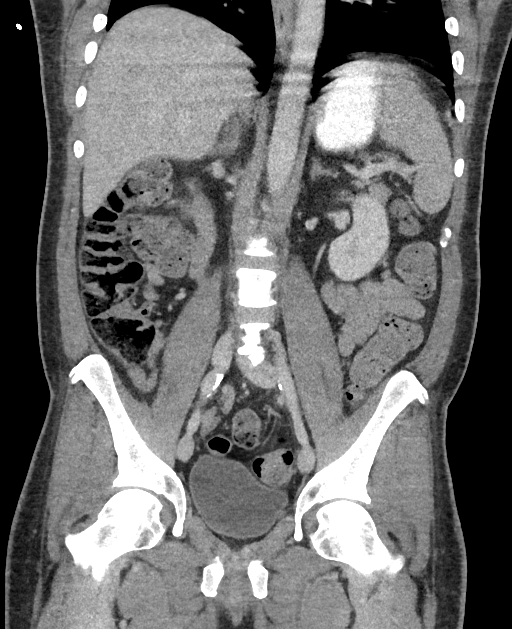

[16 of 46 positions shown; findings below may reference images not displayed]

FINDINGS: Lower chest:  No acute findings.

Hepatobiliary: No masses or other significant abnormality.
Gallbladder is unremarkable.

Pancreas: No mass, inflammatory changes, or other significant
abnormality.

Spleen: Within normal limits in size and appearance.

Adrenals/Urinary Tract: No masses identified. No evidence of
hydronephrosis.

Stomach/Bowel: No evidence of obstruction, inflammatory process, or
abnormal fluid collections.

Vascular/Lymphatic: No pathologically enlarged lymph nodes. No
evidence of abdominal aortic aneurysm.

Reproductive: No mass or other significant abnormality.

Other: Small periumbilical ventral hernia again seen containing only
fat. No evidence of herniated bowel loops.

Musculoskeletal:  No suspicious bone lesions identified.
IMPRESSION: No acute findings within the abdomen or pelvis.

Stable small periumbilical hernia containing only fat.

## 2015-10-01 NOTE — Patient Instructions (Signed)
Medication Instructions:  Your physician recommends that you continue on your current medications as directed. Please refer to the Current Medication list given to you today.  Labwork: None  Testing/Procedures: None  Follow-Up: As needed   If you need a refill on your cardiac medications before your next appointment, please call your pharmacy.

## 2015-10-01 NOTE — Progress Notes (Signed)
Cardiology Office Note   Date:  10/01/2015   ID:  Jimmy [name], DOB 1965-09-04, MRN [id number]  PCP:  Lucilla EdinAUB, STEVE A, MD  Cardiologist:   Chilton Siiffany Roseland, MD   Chief Complaint  Patient presents with  . Follow-up    6 months  pt c/o swelling in feet      History of Present Illness: Jimmy [name] is a 50 y.o. male herpes zoster opthalmicus, chronic inflammatory demyelinating polyneuropathy (CIDP), possible adrenal insufficiency, and syncope who presents for management of orthostatic hypotension and syncope.  Jimmy [name] has been struggling with syncope since 10/2014. In June he had an episode of herpes zoster ophthalmicus. Starting the following month y he developed recurrent episodes of profound orthostasis.  He required multiple hospitalizations and was subsequently found to have mild adrenal insufficiency and POTS.  He was started on midodrine and florinef.  Midodrine was discontinued due to urinary retention.  He was discharged on Florinef, hydrocortisone and Northera.  Jimmy [name] continues to be under the care of his neurologist Neurologist, Dr. Joycelyn SchmidVikram Penumalli.  He has struggled with supine hypertension and midodrine was discontinued.  At his follow-up appointment with Dr. Marjory LiesPenumalli on 11/7 the northera was increased to 100 mg 4 times daily.  However, he continued to have episodes of syncope.  Jimmy [name] was evaluated in our office on 03/17/15.  His orthostatic vital signs were consistent with autonomic dysfunction.  He had a profound drop in his BP without much heart rate response upon standing. He had a chest CT which ruled out malignancy with paraneoplastic syndrome.  His Florinef was increased to 0.4 mg daily. He was advised to wear compression stockings and increase hydration.  Northera was subsequently discontinued due to supine hypertension.  He has also been able to discontinue Florinef.  Overall he has been feeling much better. However he continues to have some good days  and bad days. He denies any recurrent dizziness or syncope. He does intermittently notice lower extremity edema. He is unable to associated with changes in his diet or physical activity. He denies any recent long car rides or plane rides. He also denies any orthopnea or PND.   Past Medical History  Diagnosis Date  . Back pain   . DDD (degenerative disc disease), cervical   . Spinal stenosis   . Headache     migraines  . Essential hypertension 06/04/2015  . Autonomic dysfunction 10/01/2015    Past Surgical History  Procedure Laterality Date  . Back surgery  2002,2009    x 2, lower , job injury, spinal stenosis     Current Outpatient Prescriptions  Medication Sig Dispense Refill  . Docusate Sodium (COLACE PO) Take by mouth.    . gabapentin (NEURONTIN) 300 MG capsule Take 2 capsules 3 times a day. 180 capsule 11  . hydrocortisone (CORTEF) 5 MG tablet Take 1-2 tablets (5-10 mg total) by mouth 2 (two) times daily. Take 2 tablets in the morning and 1 at 4pm 120 tablet 3  . meloxicam (MOBIC) 7.5 MG tablet Take 7.5 mg by mouth daily.    Marland Kitchen. morphine (MSIR) 15 MG tablet Take 2 tablets (30 mg total) by mouth every 6 (six) hours as needed for severe pain. 160 tablet 0  . polyethylene glycol (MIRALAX / GLYCOLAX) packet Take 17 g by mouth daily as needed for mild constipation. Reported on 06/26/2015     No current facility-administered medications for this visit.    Allergies:   Midodrine hcl; Reglan; Antihistamines,  chlorpheniramine-type; and Pyridium    Social History:  The patient  reports that he quit smoking about 8 years ago. He has never used smokeless tobacco. He reports that he does not drink alcohol or use illicit drugs.   Family History:  The patient's family history includes Cancer in his maternal grandfather and mother; Heart disease in his maternal grandmother; Hyperlipidemia in his maternal grandmother; Hypertension in his maternal grandfather, maternal grandmother, and mother.     ROS:  Please see the history of present illness.   Otherwise, review of systems are positive for none.   All other systems are reviewed and negative.    PHYSICAL EXAM: VS:  BP 118/70 mmHg  Pulse 80  Ht  (1.803 m)  Wt 229 lb 6.4 oz (104.055 kg)  BMI 32.01 kg/m2 , BMI Body mass index is 32.01 kg/(m^2).  GENERAL: Well-appearing.   HEENT:  Pupils equal round and reactive, fundi not visualized, oral mucosa unremarkable NECK:  No jugular venous distention, waveform within normal limits, carotid upstroke brisk and symmetric, no bruits, no thyromegaly LYMPHATICS:  No cervical adenopathy LUNGS:  Clear to auscultation bilaterally HEART:  RRR.  PMI not displaced or sustained,S1 and S2 within normal limits, no S3, no S4, no clicks, no rubs, no murmurs ABD:  Flat, positive bowel sounds normal in frequency in pitch, no bruits, no rebound, no guarding, no midline pulsatile mass, no hepatomegaly, no splenomegaly EXT:  2 plus pulses throughout, trace edema, no cyanosis no clubbing SKIN:  No rashes no nodules NEURO:  Cranial nerves II through XII grossly intact, motor grossly intact throughout PSYCH:  Cognitively intact, oriented to person place and time   EKG:  EKG is not ordered today.   Echo 12/13/14: Study Conclusions  - Left ventricle: The cavity size was normal. Wall thickness was normal. Systolic function was normal. The estimated ejection fraction was in the range of 60% to 65%. Wall motion was normal; there were no regional wall motion abnormalities.  Recent Labs: 12/04/2014: TSH 1.145 02/01/2015: ALT 14* 06/01/2015: BUN 17; Creatinine, Ser 0.96; Hemoglobin 13.0; Platelets 148*; Potassium 3.5; Sodium 144    Lipid Panel No results found for: CHOL, TRIG, HDL, CHOLHDL, VLDL, LDLCALC, LDLDIRECT    Wt Readings from Last 3 Encounters:  10/01/15 229 lb 6.4 oz (104.055 kg)  09/24/15 226 lb (102.513 kg)  09/03/15 218 lb (98.884 kg)      ASSESSMENT AND PLAN:  #  Orthostatic hypotension: Symptoms have resolved and were due to autonomic dysfunction. He has since been diagnosed with CIDP, which is likely the cause of his hypotension. He no longer requires Florinef or Northera.  He will follow up as needed.    # Lower extremity edema: Mr. Cocuzza has trace edema on exam. His neck veins are not elevated. He had a normal echocardiogram in August. I suspect that this is also due to autonomic dysfunction and he has no evidence of heart failure.  I recommended that he wear compression socks as needed.  Current medicines are reviewed at length with the patient today.  The patient does not have concerns regarding medicines.  The following changes have been made:  Increase florinef as above.  Labs/ tests ordered today include:   No orders of the defined types were placed in this encounter.     Disposition:   FU with Keslee Harrington C. Duke Salvia, MD as needed.    Signed, Chilton Si, MD  10/01/2015 12:59 PM     [facility]

## 2015-10-20 ENCOUNTER — Ambulatory Visit (INDEPENDENT_AMBULATORY_CARE_PROVIDER_SITE_OTHER): Payer: Medicare Other | Admitting: Emergency Medicine

## 2015-10-20 VITALS — BP 130/80 | HR 84 | Temp 98.2°F | Resp 16 | Ht 71.0 in | Wt 237.4 lb

## 2015-10-20 DIAGNOSIS — G909 Disorder of the autonomic nervous system, unspecified: Secondary | ICD-10-CM

## 2015-10-20 DIAGNOSIS — M545 Low back pain, unspecified: Secondary | ICD-10-CM

## 2015-10-20 DIAGNOSIS — G8929 Other chronic pain: Secondary | ICD-10-CM

## 2015-10-20 DIAGNOSIS — M549 Dorsalgia, unspecified: Secondary | ICD-10-CM

## 2015-10-20 MED ORDER — MORPHINE SULFATE 15 MG PO TABS: 30.0000 mg | ORAL_TABLET | Freq: Four times a day (QID) | ORAL | Status: DC | PRN

## 2015-10-20 NOTE — Patient Instructions (Signed)
     IF you received an x-ray today, you will receive an invoice from [facility]. Please contact [facility] at [phone] with questions or concerns regarding your invoice.   IF you received labwork today, you will receive an invoice from [facility]. Please contact [facility] at [phone] with questions or concerns regarding your invoice.   Our billing staff will not be able to assist you with questions regarding bills from these companies.  You will be contacted with the lab results as soon as they are available. The fastest way to get your results is to activate your My Chart account. Instructions are located on the last page of this paperwork. If you have not heard from us regarding the results in 2 weeks, please contact this office.      

## 2015-10-20 NOTE — Progress Notes (Signed)
Patient ID: Jimmy [name] Behrman, male   DOB: [birth year], 50 y.o.   MRN: [id number]    By signing my name below, I, Essence Howell, attest that this documentation has been prepared under the direction and in the presence of Collene GobbleSteven A Mariene Dickerman, MD Electronically Signed: Charline BillsEssence Howell, ED Scribe 10/20/2015 at 12:29 PM.  Chief Complaint:  Chief Complaint  Patient presents with  . Follow-up    back pain  . Medication Refill    morphine    HPI: Jimmy [name] is a 50 y.o. male, with a h/o spinal stenosis, who [facility] for a follow-up on back pain. Pt states that lower back pain is unchanged. [facility]15 for chronic back pain. He requests a medication refill of morphine at this visit. Pshx for spinal stenosis in 2002 and 2009.   HTN Triage BP: 130/80. Repeat BP on the L: 110/70.   Past Medical History  Diagnosis Date  . Back pain   . DDD (degenerative disc disease), cervical   . Spinal stenosis   . Headache     migraines  . Essential hypertension 06/04/2015  . Autonomic dysfunction 10/01/2015   Past Surgical History  Procedure Laterality Date  . Back surgery  2002,2009    x 2, lower , job injury, spinal stenosis   Social History   Social History  . Marital Status: Married    Spouse Name: [name]  . Number of Children: 4  . Years of Education: 12   Occupational History  .      disabled   Social History Main Topics  . Smoking status: Former Smoker    Quit date: 05/02/2007  . Smokeless tobacco: Never Used  . Alcohol Use: No  . Drug Use: No  . Sexual Activity: Yes   Other Topics Concern  . None   Social History Narrative   Lives at home with wife, children   Caffeine use- 1 soda a day         Epworth Sleepiness Scale = 4 (as of 03/17/15)   Family History  Problem Relation Age of Onset  . Heart disease Maternal Grandmother   . Hypertension Maternal Grandmother   . Hyperlipidemia Maternal Grandmother   .  Cancer Maternal Grandfather   . Hypertension Maternal Grandfather   . Hypertension Mother   . Cancer Mother    Allergies  Allergen Reactions  . Midodrine Hcl Other (See Comments)    Urinary retention  . Reglan [Metoclopramide] Other (See Comments)    Altered mental status  . Antihistamines, Chlorpheniramine-Type Other (See Comments)    Hallucinations  . Pyridium [Phenazopyridine Hcl] Hives and Other (See Comments)    Dizziness and indigestion   Prior to Admission medications   Medication Sig Start Date End Date Taking? Authorizing Provider  Docusate Sodium (COLACE PO) Take by mouth.   Yes Historical Provider, MD  gabapentin (NEURONTIN) 300 MG capsule Take 2 capsules 3 times a day. 09/24/15  Yes Collene GobbleSteven A Spike Desilets, MD  hydrocortisone (CORTEF) 5 MG tablet Take 1-2 tablets (5-10 mg total) by mouth 2 (two) times daily. Take 2 tablets in the morning and 1 at 4pm 06/26/15  Yes Collene GobbleSteven A Thijs Brunton, MD  meloxicam (MOBIC) 7.5 MG tablet Take 7.5 mg by mouth daily.   Yes Historical Provider, MD  morphine (MSIR) 15 MG tablet Take 2 tablets (30 mg total) by mouth every 6 (six) hours as needed for severe pain. 09/24/15  Yes Maylon PeppersSteven A  Vallarie Fei, MD  polyethylene glycol (MIRALAX / GLYCOLAX) packet Take 17 g by mouth daily as needed for mild constipation. Reported on 06/26/2015   Yes Historical Provider, MD   ROS: The patient denies fevers, chills, night sweats, unintentional weight loss, chest pain, palpitations, wheezing, dyspnea on exertion, nausea, vomiting, dysuria, hematuria, melena, numbness, weakness, or tingling.   All other systems have been reviewed and were otherwise negative with the exception of those mentioned in the HPI and as above.    PHYSICAL EXAM: Filed Vitals:   10/20/15 1002  BP: 130/80  Pulse: 84  Temp: 98.2 F (36.8 C)  Resp: 16   Body mass index is 33.13 kg/(m^2).  General: Alert, no acute distress HEENT:  Normocephalic, atraumatic, oropharynx patent. Eye: Nonie Hoyer [facility] Cardiovascular:  Regular rate and rhythm, no rubs murmurs or gallops. No Carotid bruits, radial pulse intact. No pedal edema.  Respiratory: Clear to auscultation bilaterally. No wheezes, rales, or rhonchi. No cyanosis, no use of accessory musculature Abdominal: No organomegaly, abdomen is soft. Mild tenderness RLQ. No masses. Musculoskeletal: Gait intact. No edema. Mild tenderness in R lower back.  Skin: No rashes. Neurologic: Facial musculature symmetric. Psychiatric: Patient acts appropriately throughout our interaction. Lymphatic: No cervical or submandibular lymphadenopathy  LABS:  EKG/XRAY:   Primary read interpreted by [facility]. Cleta Alberts at [facility].  ASSESSMENT/PLAN: Patient is doing well. I refilled his pain medications. He has an appointment at a pain clinic next week. I gave him information about other PCPs in the area except in patients.I personally performed the services described in this documentation, which was scribed in my presence. The recorded information has been reviewed and is accurate.    Gross sideeffects, risk and benefits, and alternatives of medications d/w patient. Patient is aware that all medications have potential sideeffects and we are unable to predict every sideeffect or drug-drug interaction that may occur.  [name] MD 10/20/2015 12:14 PM

## 2015-11-12 ENCOUNTER — Ambulatory Visit: Payer: Medicare Other

## 2015-12-15 DIAGNOSIS — G894 Chronic pain syndrome: Secondary | ICD-10-CM | POA: Diagnosis not present

## 2015-12-15 DIAGNOSIS — Z79891 Long term (current) use of opiate analgesic: Secondary | ICD-10-CM | POA: Diagnosis not present

## 2015-12-15 DIAGNOSIS — M79604 Pain in right leg: Secondary | ICD-10-CM | POA: Diagnosis not present

## 2015-12-15 DIAGNOSIS — M79671 Pain in right foot: Secondary | ICD-10-CM | POA: Diagnosis not present

## 2015-12-15 DIAGNOSIS — M79672 Pain in left foot: Secondary | ICD-10-CM | POA: Diagnosis not present

## 2015-12-15 DIAGNOSIS — M25551 Pain in right hip: Secondary | ICD-10-CM | POA: Diagnosis not present

## 2015-12-15 DIAGNOSIS — M79605 Pain in left leg: Secondary | ICD-10-CM | POA: Diagnosis not present

## 2015-12-15 DIAGNOSIS — M545 Low back pain: Secondary | ICD-10-CM | POA: Diagnosis not present

## 2015-12-29 DIAGNOSIS — Z79891 Long term (current) use of opiate analgesic: Secondary | ICD-10-CM | POA: Diagnosis not present

## 2015-12-29 DIAGNOSIS — M25551 Pain in right hip: Secondary | ICD-10-CM | POA: Diagnosis not present

## 2015-12-29 DIAGNOSIS — G894 Chronic pain syndrome: Secondary | ICD-10-CM | POA: Diagnosis not present

## 2015-12-29 DIAGNOSIS — M545 Low back pain: Secondary | ICD-10-CM | POA: Diagnosis not present

## 2015-12-29 DIAGNOSIS — M79605 Pain in left leg: Secondary | ICD-10-CM | POA: Diagnosis not present

## 2016-01-04 ENCOUNTER — Other Ambulatory Visit: Payer: Self-pay

## 2016-01-04 ENCOUNTER — Emergency Department (HOSPITAL_COMMUNITY)
Admission: EM | Admit: 2016-01-04 | Discharge: 2016-01-05 | Disposition: A | Discharge status: Home / Self Care | Payer: Medicare Other | Attending: Emergency Medicine | Admitting: Emergency Medicine

## 2016-01-04 ENCOUNTER — Encounter (HOSPITAL_COMMUNITY): Payer: Self-pay | Admitting: Emergency Medicine

## 2016-01-04 ENCOUNTER — Telehealth: Payer: Self-pay | Admitting: Internal Medicine

## 2016-01-04 DIAGNOSIS — T50905A Adverse effect of unspecified drugs, medicaments and biological substances, initial encounter: Secondary | ICD-10-CM

## 2016-01-04 DIAGNOSIS — T50995A Adverse effect of other drugs, medicaments and biological substances, initial encounter: Secondary | ICD-10-CM | POA: Diagnosis not present

## 2016-01-04 DIAGNOSIS — I10 Essential (primary) hypertension: Secondary | ICD-10-CM | POA: Insufficient documentation

## 2016-01-04 DIAGNOSIS — Y828 Other medical devices associated with adverse incidents: Secondary | ICD-10-CM | POA: Insufficient documentation

## 2016-01-04 DIAGNOSIS — R109 Unspecified abdominal pain: Secondary | ICD-10-CM | POA: Diagnosis not present

## 2016-01-04 DIAGNOSIS — R112 Nausea with vomiting, unspecified: Secondary | ICD-10-CM | POA: Diagnosis present

## 2016-01-04 DIAGNOSIS — Z87891 Personal history of nicotine dependence: Secondary | ICD-10-CM | POA: Insufficient documentation

## 2016-01-04 DIAGNOSIS — Z79899 Other long term (current) drug therapy: Secondary | ICD-10-CM | POA: Insufficient documentation

## 2016-01-04 DIAGNOSIS — K5900 Constipation, unspecified: Secondary | ICD-10-CM | POA: Diagnosis not present

## 2016-01-04 DIAGNOSIS — R531 Weakness: Secondary | ICD-10-CM | POA: Diagnosis not present

## 2016-01-04 DIAGNOSIS — T402X5A Adverse effect of other opioids, initial encounter: Secondary | ICD-10-CM | POA: Diagnosis not present

## 2016-01-04 DIAGNOSIS — T887XXA Unspecified adverse effect of drug or medicament, initial encounter: Secondary | ICD-10-CM | POA: Diagnosis not present

## 2016-01-04 DIAGNOSIS — I951 Orthostatic hypotension: Secondary | ICD-10-CM

## 2016-01-04 DIAGNOSIS — R404 Transient alteration of awareness: Secondary | ICD-10-CM | POA: Diagnosis not present

## 2016-01-04 DIAGNOSIS — J111 Influenza due to unidentified influenza virus with other respiratory manifestations: Secondary | ICD-10-CM | POA: Diagnosis not present

## 2016-01-04 MED ORDER — SODIUM CHLORIDE 0.9 % IV BOLUS (SEPSIS)
1000.0000 mL | Freq: Once | INTRAVENOUS | Status: AC
  Administered 2016-01-05: 1000 mL via INTRAVENOUS

## 2016-01-04 MED ORDER — HYDROCORTISONE 5 MG PO TABS: 5.0000 mg | ORAL_TABLET | Freq: Two times a day (BID) | ORAL | 3 refills | Status: DC

## 2016-01-04 MED ORDER — ONDANSETRON HCL 4 MG/2ML IJ SOLN
4.0000 mg | Freq: Once | INTRAMUSCULAR | Status: DC
  Filled 2016-01-04: qty 2

## 2016-01-04 NOTE — ED Triage Notes (Signed)
Pt transported by EMS from home with c/o nausea, emesis x 1 week, body aches

## 2016-01-04 NOTE — ED Triage Notes (Signed)
Pt started to feel bad last week and vomited a few times, in the last two days he's vomited a lot and has not been able to keep his medications down, he started to have syncopal episodes in the lobby

## 2016-01-04 NOTE — Telephone Encounter (Signed)
Patent need refill of medication hydrocortisone (CORTEF) 5 MG tablet [facility], [place] - [address] [phone] (Phone) [phone] (Fax)

## 2016-01-05 ENCOUNTER — Emergency Department (HOSPITAL_COMMUNITY): Payer: Medicare Other

## 2016-01-05 ENCOUNTER — Encounter (HOSPITAL_COMMUNITY): Payer: Self-pay | Admitting: Emergency Medicine

## 2016-01-05 DIAGNOSIS — R109 Unspecified abdominal pain: Secondary | ICD-10-CM | POA: Diagnosis not present

## 2016-01-05 LAB — CBC WITH DIFFERENTIAL/PLATELET
Basophils Absolute: 0 10*3/uL (ref 0.0–0.1)
Basophils Relative: 0 %
Eosinophils Absolute: 0.1 10*3/uL (ref 0.0–0.7)
Eosinophils Relative: 2 %
HCT: 44.3 % (ref 39.0–52.0)
Hemoglobin: 15.8 g/dL (ref 13.0–17.0)
Lymphocytes Relative: 22 %
Lymphs Abs: 1.5 10*3/uL (ref 0.7–4.0)
MCH: 34.6 pg — ABNORMAL HIGH (ref 26.0–34.0)
MCHC: 35.7 g/dL (ref 30.0–36.0)
MCV: 96.9 fL (ref 78.0–100.0)
Monocytes Absolute: 0.7 10*3/uL (ref 0.1–1.0)
Monocytes Relative: 10 %
Neutro Abs: 4.4 10*3/uL (ref 1.7–7.7)
Neutrophils Relative %: 66 %
Platelets: 170 10*3/uL (ref 150–400)
RBC: 4.57 MIL/uL (ref 4.22–5.81)
RDW: 12.3 % (ref 11.5–15.5)
WBC: 6.6 10*3/uL (ref 4.0–10.5)

## 2016-01-05 LAB — URINALYSIS, ROUTINE W REFLEX MICROSCOPIC
Bilirubin Urine: NEGATIVE
Glucose, UA: NEGATIVE mg/dL
Hgb urine dipstick: NEGATIVE
Ketones, ur: NEGATIVE mg/dL
Leukocytes, UA: NEGATIVE
Nitrite: NEGATIVE
Protein, ur: 30 mg/dL — AB
Specific Gravity, Urine: 1.03 (ref 1.005–1.030)
pH: 6 (ref 5.0–8.0)

## 2016-01-05 LAB — RAPID URINE DRUG SCREEN, HOSP PERFORMED
Amphetamines: NOT DETECTED
Barbiturates: NOT DETECTED
Benzodiazepines: NOT DETECTED
Cocaine: NOT DETECTED
Opiates: POSITIVE — AB
Tetrahydrocannabinol: NOT DETECTED

## 2016-01-05 LAB — URINE MICROSCOPIC-ADD ON: RBC / HPF: NONE SEEN RBC/hpf (ref 0–5)

## 2016-01-05 LAB — HEPATIC FUNCTION PANEL
ALT: 25 U/L (ref 17–63)
AST: 22 U/L (ref 15–41)
Albumin: 4.2 g/dL (ref 3.5–5.0)
Alkaline Phosphatase: 97 U/L (ref 38–126)
Bilirubin, Direct: 0.1 mg/dL (ref 0.1–0.5)
Indirect Bilirubin: 0.6 mg/dL (ref 0.3–0.9)
Total Bilirubin: 0.7 mg/dL (ref 0.3–1.2)
Total Protein: 8.2 g/dL — ABNORMAL HIGH (ref 6.5–8.1)

## 2016-01-05 LAB — LIPASE, BLOOD: Lipase: 18 U/L (ref 11–51)

## 2016-01-05 LAB — I-STAT TROPONIN, ED: Troponin i, poc: 0 ng/mL (ref 0.00–0.08)

## 2016-01-05 MED ORDER — SODIUM CHLORIDE 0.9 % IV BOLUS (SEPSIS)
500.0000 mL | Freq: Once | INTRAVENOUS | Status: AC
  Administered 2016-01-05: 500 mL via INTRAVENOUS

## 2016-01-05 MED ORDER — ONDANSETRON 8 MG PO TBDP
8.0000 mg | ORAL_TABLET | Freq: Once | ORAL | Status: AC
  Administered 2016-01-05: 8 mg via ORAL
  Filled 2016-01-05: qty 1

## 2016-01-05 MED ORDER — HYDROCORTISONE NA SUCCINATE PF 100 MG IJ SOLR
100.0000 mg | Freq: Once | INTRAMUSCULAR | Status: AC
  Administered 2016-01-05: 100 mg via INTRAVENOUS
  Filled 2016-01-05: qty 2

## 2016-01-05 MED ORDER — ONDANSETRON 8 MG PO TBDP: ORAL_TABLET | ORAL | 0 refills | Status: DC

## 2016-01-05 MED ORDER — KETOROLAC TROMETHAMINE 30 MG/ML IJ SOLN
30.0000 mg | Freq: Once | INTRAMUSCULAR | Status: AC
  Administered 2016-01-05: 30 mg via INTRAVENOUS
  Filled 2016-01-05: qty 1

## 2016-01-05 NOTE — ED Provider Notes (Signed)
[facility]-EMERGENCY DEPT Provider Note   CSN: [id number] Arrival date & time: 01/04/16  2200  By signing my name below, I, Jimmy [name], attest that this documentation has been prepared under the direction and in the presence of physician practitioner, Felicite Zeimet, MD. Electronically Signed: Linna Darnerussell [name], Scribe. 01/05/2016. 12:10 AM.  History   Chief Complaint Chief Complaint  Patient presents with  . Emesis  . Abdominal Pain    The history is provided by the patient. No language interpreter was used.  Emesis   This is a new problem. The current episode started more than 2 days ago. The problem occurs continuously. The problem has been gradually worsening. The emesis has an appearance of stomach contents. There has been no fever. Pertinent negatives include no chills, no diarrhea, no fever and no myalgias. Risk factors: started new morphine on Wednesday.     HPI Comments: Jimmy [name] is a 50 y.o. male brought in by EMS who presents to the Emergency Department complaining of sudden onset, constant, worsening, nausea and vomiting beginning 6 days ago. He endorses associated constipation. Pt states he went to pain management 6 days ago for his chronic back pain and was prescribed morphine 15 mg immediate release x2 daily. Pt has used morphine in the past (lower dosage) and started getting sick shortly after taking this medication. He states he has not used morphine in the past couple of days because he thinks it caused his symptoms. His wife reports he was initially experiencing intermittent nausea and vomiting once or twice a day but these symptoms have become constant. Wife states patient has been vomiting with any sort of food or fluid intake over the last few days. He reports he has chronic constipation due to long term use of pain medication; he additionally uses gabapentin and meloxicam regularly. Pt also uses hydrocortisone tablets 5 mg on a regular basis and states he has been  regurgitating them over the last several days. He notes his daughter was just diagnosed with strep throat. Pt denies h/o spinal injections or abdominal surgeries. He further denies fever, diarrhea, dysuria or any other associated symptoms.  Past Medical History:  Diagnosis Date  . Autonomic dysfunction 10/01/2015  . Back pain   . DDD (degenerative disc disease), cervical   . Essential hypertension 06/04/2015  . Headache    migraines  . Spinal stenosis     Patient Active Problem List   Diagnosis Date Noted  . Autonomic dysfunction 10/01/2015  . Essential hypertension 06/04/2015  . Low serum cortisol level (HCC) 01/27/2015  . Hyperglycemia 01/03/2015  . Chronic pain syndrome   . Hyponatremia   . Hypotension, postural   . Syncope 12/17/2014  . Faintness   . Right hip pain   . Abnormal EKG 12/12/2014  . POTS (postural orthostatic tachycardia syndrome)   . Adrenal insufficiency (HCC)   . Groin pain   . Orthostatic hypotension   . Lower abdominal pain 12/04/2014  . Weight loss, unintentional   . Absolute anemia   . Prostatitis 12/03/2014  . Abdominal pain 12/02/2014  . Hypokalemia 12/02/2014  . Urinary retention 12/02/2014  . Prostatitis, acute   . Abdominal pain, lower   . Chronic lower back pain     Past Surgical History:  Procedure Laterality Date  . BACK SURGERY  2002,2009   x 2, lower , job injury, spinal stenosis       Home Medications    Prior to Admission medications   Medication Sig Start Date End Date  Taking? Authorizing Provider  Docusate Sodium (COLACE PO) Take by mouth.   Yes Historical Provider, MD  gabapentin (NEURONTIN) 300 MG capsule Take 2 capsules 3 times a day. 09/24/15  Yes Collene Gobble, MD  hydrocortisone (CORTEF) 5 MG tablet Take 1-2 tablets (5-10 mg total) by mouth 2 (two) times daily. Take 2 tablets in the morning and 1 at 4pm 01/04/16  Yes Carlus Pavlov, MD  meloxicam (MOBIC) 7.5 MG tablet Take 7.5 mg by mouth daily.   Yes Historical  Provider, MD  morphine (MSIR) 15 MG tablet Take 2 tablets (30 mg total) by mouth every 6 (six) hours as needed for severe pain. 10/20/15  Yes Collene Gobble, MD    Family History Family History  Problem Relation Age of Onset  . Heart disease Maternal Grandmother   . Hypertension Maternal Grandmother   . Hyperlipidemia Maternal Grandmother   . Cancer Maternal Grandfather   . Hypertension Maternal Grandfather   . Hypertension Mother   . Cancer Mother     Social History Social History  Substance Use Topics  . Smoking status: Former Smoker    Quit date: 05/02/2007  . Smokeless tobacco: Never Used  . Alcohol use No     Allergies   Suboxone [buprenorphine hcl-naloxone hcl]; Midodrine hcl; Reglan [metoclopramide]; Antihistamines, chlorpheniramine-type; and Pyridium [phenazopyridine hcl]   Review of Systems Review of Systems  Constitutional: Negative for chills and fever.  Respiratory: Negative for shortness of breath.   Cardiovascular: Negative for chest pain, palpitations and leg swelling.  Gastrointestinal: Positive for constipation, nausea and vomiting. Negative for diarrhea and rectal pain.  Genitourinary: Negative for dysuria.  Musculoskeletal: Negative for myalgias.  All other systems reviewed and are negative.    Physical Exam Updated Vital Signs BP 123/86 (BP Location: Right Arm)   Pulse 88   Temp 98.2 F (36.8 C) (Oral)   SpO2 99%   Physical Exam  Constitutional: He appears well-developed and well-nourished.  HENT:  Head: Normocephalic.  Mouth/Throat: Oropharynx is clear and moist. Mucous membranes are dry. No oropharyngeal exudate.  Eyes: Conjunctivae and EOM are normal. Right eye exhibits no discharge. Left eye exhibits no discharge. No scleral icterus.  Pupils are pinpoint.  Neck: Normal range of motion. Neck supple. No JVD present. No tracheal deviation present.  Trachea is midline. No stridor or carotid bruits.  Cardiovascular: Normal rate, regular  rhythm, normal heart sounds and intact distal pulses.   No murmur heard. Pulmonary/Chest: Effort normal and breath sounds normal. No stridor. No respiratory distress. He has no wheezes. He has no rales.  Lungs CTA bilaterally.  Abdominal: Soft. Bowel sounds are normal. He exhibits no distension. There is no tenderness. There is no rebound and no guarding.  Profuse gas. Full of stool in ascending, descending, and transverse colon.  Musculoskeletal: Normal range of motion. He exhibits no edema or tenderness.  Lymphadenopathy:    He has no cervical adenopathy.  Neurological: He is alert. He has normal reflexes.  Skin: Skin is warm and dry. Capillary refill takes less than 2 seconds.  Psychiatric: He has a normal mood and affect. His behavior is normal.  Nursing note and vitals reviewed.   ED Treatments / Results   Vitals:   01/05/16 0505 01/05/16 0536  BP: 115/72 155/73  Pulse: 84 86  Resp: 16 15  Temp: 98.1 F (36.7 C) 98.2 F (36.8 C)   Results for orders placed or performed during the hospital encounter of 01/04/16  CBC with Differential/Platelet  Result  Value Ref Range   WBC 6.6 4.0 - 10.5 K/uL   RBC 4.57 4.22 - 5.81 MIL/uL   Hemoglobin 15.8 13.0 - 17.0 g/dL   HCT 40.9 81.1 - 91.4 %   MCV 96.9 78.0 - 100.0 fL   MCH 34.6 (H) 26.0 - 34.0 pg   MCHC 35.7 30.0 - 36.0 g/dL   RDW 78.2 95.6 - 21.3 %   Platelets 170 150 - 400 K/uL   Neutrophils Relative % 66 %   Neutro Abs 4.4 1.7 - 7.7 K/uL   Lymphocytes Relative 22 %   Lymphs Abs 1.5 0.7 - 4.0 K/uL   Monocytes Relative 10 %   Monocytes Absolute 0.7 0.1 - 1.0 K/uL   Eosinophils Relative 2 %   Eosinophils Absolute 0.1 0.0 - 0.7 K/uL   Basophils Relative 0 %   Basophils Absolute 0.0 0.0 - 0.1 K/uL  Hepatic function panel  Result Value Ref Range   Total Protein 8.2 (H) 6.5 - 8.1 g/dL   Albumin 4.2 3.5 - 5.0 g/dL   AST 22 15 - 41 U/L   ALT 25 17 - 63 U/L   Alkaline Phosphatase 97 38 - 126 U/L   Total Bilirubin 0.7 0.3 -  1.2 mg/dL   Bilirubin, Direct 0.1 0.1 - 0.5 mg/dL   Indirect Bilirubin 0.6 0.3 - 0.9 mg/dL  Lipase, blood  Result Value Ref Range   Lipase 18 11 - 51 U/L  Urinalysis, Routine w reflex microscopic (not at [facility])  Result Value Ref Range   Color, Urine YELLOW YELLOW   APPearance CLEAR CLEAR   Specific Gravity, Urine 1.030 1.005 - 1.030   pH 6.0 5.0 - 8.0   Glucose, UA NEGATIVE NEGATIVE mg/dL   Hgb urine dipstick NEGATIVE NEGATIVE   Bilirubin Urine NEGATIVE NEGATIVE   Ketones, ur NEGATIVE NEGATIVE mg/dL   Protein, ur 30 (A) NEGATIVE mg/dL   Nitrite NEGATIVE NEGATIVE   Leukocytes, UA NEGATIVE NEGATIVE  Urine rapid drug screen (hosp performed)  Result Value Ref Range   Opiates POSITIVE (A) NONE DETECTED   Cocaine NONE DETECTED NONE DETECTED   Benzodiazepines NONE DETECTED NONE DETECTED   Amphetamines NONE DETECTED NONE DETECTED   Tetrahydrocannabinol NONE DETECTED NONE DETECTED   Barbiturates NONE DETECTED NONE DETECTED  Urine microscopic-add on  Result Value Ref Range   Squamous Epithelial / LPF 0-5 (A) NONE SEEN   WBC, UA 0-5 0 - 5 WBC/hpf   RBC / HPF NONE SEEN 0 - 5 RBC/hpf   Bacteria, UA RARE (A) NONE SEEN  I-stat troponin, ED  Result Value Ref Range   Troponin i, poc 0.00 0.00 - 0.08 ng/mL   Comment 3           Dg Abd Acute W/chest  Result Date: 01/05/2016 CLINICAL DATA:  Acute onset of vomiting. Mid abdominal pain and chronic constipation. Initial encounter. EXAM: DG ABDOMEN ACUTE W/ 1V CHEST COMPARISON:  Chest radiograph from 06/01/2015, and CT of the abdomen and pelvis from 01/29/2015 FINDINGS: The lungs are well-aerated and clear. There is no evidence of focal opacification, pleural effusion or pneumothorax. The cardiomediastinal silhouette is within normal limits. The visualized bowel gas pattern is unremarkable. Scattered stool and air are seen within the colon; there is no evidence of small bowel dilatation to suggest obstruction. No free intra-abdominal air is identified  on the provided decubitus view. No acute osseous abnormalities are seen; the sacroiliac joints are unremarkable in appearance. IMPRESSION: 1. Unremarkable bowel gas pattern; no free intra-abdominal  air seen. Small to moderate amount of stool noted in the colon. 2. No acute cardiopulmonary process seen. Electronically Signed   By: Roanna Raider M.D.   On: 01/05/2016 01:02   Medications  ondansetron (ZOFRAN) injection 4 mg (0 mg Intravenous Hold 01/05/16 0504)  sodium chloride 0.9 % bolus 1,000 mL (0 mLs Intravenous Stopped 01/05/16 0500)  ondansetron (ZOFRAN-ODT) disintegrating tablet 8 mg (8 mg Oral Given 01/05/16 0229)  ketorolac (TORADOL) 30 MG/ML injection 30 mg (30 mg Intravenous Given 01/05/16 0458)  sodium chloride 0.9 % bolus 500 mL (0 mLs Intravenous Stopped 01/05/16 0552)  hydrocortisone sodium succinate (SOLU-CORTEF) 100 MG injection 100 mg (100 mg Intravenous Given 01/05/16 0502)   Feeling better post medication.    Radiology No results found.  Procedures Procedures (including critical care time)  DIAGNOSTIC STUDIES: Oxygen Saturation is 99% on RA, normal by my interpretation.    COORDINATION OF CARE: 12:23 AM Discussed treatment plan with pt at bedside and pt agreed to plan.   Initial Impression / Assessment and Plan / ED Course  I have reviewed the triage vital signs and the nursing notes.  Pertinent labs & imaging results that were available during my care of the patient were reviewed by me and considered in my medical decision making (see chart for details).   Doing much better post medication.  Rx for zofran.  Take before attempting to take your hydrocortisone.  Call your doctor to see if they want your dose adjusted.  Start miralax for constipation.  Call pain management regarding your difficulty tolerating morphine for your chronic pain.  All questions answered to patient's satisfaction. Based on history and exam patient has been appropriately medically screened and emergency  conditions excluded. Patient is stable for discharge at this time. Follow up with your PMD for recheck in 2 days and strict return precautions given.   I personally performed the services described in this documentation, which was scribed in my presence. The recorded information has been reviewed and is accurate.     Final Clinical Impressions(s) / ED Diagnoses   Final diagnoses:  None    New Prescriptions New Prescriptions   No medications on file     Cristina Ceniceros, MD 01/05/16 [phone]

## 2016-01-05 NOTE — ED Notes (Signed)
Foot IV access attempted, unsuccessful

## 2016-01-05 NOTE — ED Notes (Signed)
IV access attempted twice

## 2016-01-05 NOTE — ED Notes (Signed)
Reminded pt of the need for urine

## 2016-01-21 ENCOUNTER — Encounter: Payer: Self-pay | Admitting: Internal Medicine

## 2016-01-21 ENCOUNTER — Ambulatory Visit (INDEPENDENT_AMBULATORY_CARE_PROVIDER_SITE_OTHER): Payer: Medicare Other | Admitting: Internal Medicine

## 2016-01-21 VITALS — BP 142/84 | HR 103 | Wt 259.0 lb

## 2016-01-21 DIAGNOSIS — E2749 Other adrenocortical insufficiency: Secondary | ICD-10-CM | POA: Diagnosis not present

## 2016-01-21 NOTE — Patient Instructions (Signed)
Please come back at 8 am for a repeat stimulation test.  Please continue Hydrocortisone 10 mg in am and 5 mg in pm.  Please come back for a follow-up appointment in 6 months.

## 2016-01-21 NOTE — Progress Notes (Signed)
Patient ID: Jimmy [name], male   DOB: [birth year], 50 y.o.   MRN: [id number]   HPI  Jimmy [name] is a 50 y.o.-year-old male, returning for f/u for adrenal insufficiency. He is here with his wife who offers most of the history. Last visit 6 months ago.  He has dizziness and fatigue again. He had to go to the ED 01/04/2016 >> dehydration, nausea, constipation. He was given im cortef.  He started to go to Pain Med 2 mo ago. He is off Morphine now. He tried Suboxone >> nausea >> had to stop. Now only on Gabapentin and Meloxicam.   He saw neurology >> dx'ed with autonomic dysfunction.   Reviewed history: Pt was dx with shingles in 09/2014 >> tx with Zovirax (no steroids) >> lower AP, dizziness and urinary retention - in need of bladder catheterization >> developed prostatitis >> ED visit >> sent home >> urinary retention and pain increased >> came back to the hospital >> admitted >> low cortisol >> started Florinef 0.1 mg daily and Midodrine 5 mg tid >> saw PCP post d/c >> orthostatic hypotension >> admitted again >> urinary retention worse >> stopped Midodrine and increased Florinef to 0.3 mg daily. During this admission a cosyntropin stimulation test was negative. He was started on Hydrocortisone pending further workup.  Upon questioning, a diagnosis of POTS was also entertained by the admitting team while in the hospital.   Pt. has been found to have a low cortisol level summer 2016 (pt also had a positive UDS on 12/04/2014 >> he was on opiates then): Component     Latest Ref Rng 12/08/2014 (2:52 pm) 12/10/2014 (5:16 AM)   Cortisol - AM     6.7 - 22.6 ug/dL 5.0 (L)  4.5 (L)   He then had a cosyntropin stimulation test while in the hospital >> normal: Component     Latest Ref Rng 12/18/2014 (7:30 AM)   Cortisol, Base      4.4  Cortisol, 30 Min      17.6  Cortisol, 60 Min      23.4   An MRI of his pituitary (02/09/2015) returned normal.  At last visit, we treated him as an adrenal  insufficiency case and continued hydrocortisone, however, we decreased his Hydrocortisone dose to 10 mg in am, 5 mg in pm. He is feeling well on this dose.   He was also on Florinef 0.3 mg daily in am - as he had dizziness and orthostatic hypotension. No more "passing out" after increasing the dose of Florinef. He developed hypertension >> Fludrocortisone tapered to off.   We repeated the cosyntropin stimulation test: mildly abnormal (normal >> 18) >> mild adrenal insufficiency. However, th ACTH was higher than expected >> possibly recovery phase: Component     Latest Ref Rng & Units 07/23/2015 07/23/2015 07/23/2015         8:32 AM  9:03 AM  9:29 AM  C206 ACTH     6 - 50 pg/mL 66 (H)    Cortisol, Plasma     ug/dL 82.9 56.2 13.0    He has severe back pain (spinal stenosis and degenerative disk ds) - disability since 2013 - 2 surgeries - steroid inj before last surgery in 2009. He is on  On Morphine IR 60 mg 4x a day  No h/o Megace, po ketoconazole, phenytoin, rifampin, chronic fluconazole use. + h/o autoimmune diseases in family mbs: DM1 in great grandparents (?) No h/o generalized infections or HIV. No IVDA. No h/o  head injury or severe HA. No h/o malignancy.  + h/o hyponatremia, but no hyperkalemia.   Chemistry      Component Value Date/Time   NA 144 06/01/2015 0001   K 3.5 06/01/2015 0001   CL 105 06/01/2015 0001   CO2 29 06/01/2015 0001   BUN 17 06/01/2015 0001   BUN 8 02/22/2015   CREATININE 0.96 06/01/2015 0001   CREATININE 0.76 03/22/2015 1028      Component Value Date/Time   CALCIUM 9.0 06/01/2015 0001   ALKPHOS 97 01/05/2016 0003   AST 22 01/05/2016 0003   ALT 25 01/05/2016 0003   BILITOT 0.7 01/05/2016 0003     Previous sodium levels were low along with a urine that was not maximally diluted: Component     Latest Ref Rng 10/21/2014 11/29/2014 11/30/2014 12/02/2014  Sodium     135 - 145 mmol/L 134 (L) 124 (L) 127 (L) 134 (L)  Potassium     3.5 - 5.1 mmol/L 3.3 (L) 3.4  (L) 3.9 3.7  Osmolality, Ur     390 - 1090 mOsm/kg   140 (L)    A TSH was normal: Lab Results  Component Value Date   TSH 1.145 12/04/2014   He also has a history of cluster HAs (resolved). He has GERD and anemia.  ROS: Constitutional: + see HPI Eyes: no blurry vision, no xerophthalmia ENT: no sore throat, no nodules palpated in throat, + dysphagia/no odynophagia, no hoarseness Cardiovascular: no CP/SOB/palpitations/no leg swelling Respiratory: no cough/SOB Gastrointestinal: + N/no V/no D/+ C Musculoskeletal: + muscle aches/+ joint aches Skin: no rashes Neurological: no tremors/numbness/tingling/+ dizziness  I reviewed pt's medications, allergies, PMH, social hx, family hx, and changes were documented in the history of present illness. Otherwise, unchanged from my initial visit note.  Past Medical History:  Diagnosis Date  . Autonomic dysfunction 10/01/2015  . Back pain   . DDD (degenerative disc disease), cervical   . Essential hypertension 06/04/2015  . Headache    migraines  . Spinal stenosis    Past Surgical History:  Procedure Laterality Date  . BACK SURGERY  2002,2009   x 2, lower , job injury, spinal stenosis   Social History   Social History  . Marital status: Married    Spouse name: [name]  . Number of children: 4  . Years of education: 12   Occupational History  .      disabled   Social History Main Topics  . Smoking status: Former Smoker    Quit date: 05/02/2007  . Smokeless tobacco: Never Used  . Alcohol use No  . Drug use: No  . Sexual activity: Yes   Other Topics Concern  . Not on file   Social History Narrative   Lives at home with wife, children   Caffeine use- 1 soda a day         Epworth Sleepiness Scale = 4 (as of 03/17/15)   Current Outpatient Prescriptions on File Prior to Visit  Medication Sig Dispense Refill  . Docusate Sodium (COLACE PO) Take by mouth.    . gabapentin (NEURONTIN) 300 MG capsule Take 2 capsules 3 times a day. 180  capsule 11  . hydrocortisone (CORTEF) 5 MG tablet Take 1-2 tablets (5-10 mg total) by mouth 2 (two) times daily. Take 2 tablets in the morning and 1 at 4pm 120 tablet 3  . ondansetron (ZOFRAN ODT) 8 MG disintegrating tablet 8mg  ODT q8 hours prn nausea 12 tablet 0   No current  facility-administered medications on file prior to visit.    Allergies  Allergen Reactions  . Suboxone [Buprenorphine Hcl-Naloxone Hcl] Itching and Nausea Only    dizziness  . Midodrine Hcl Other (See Comments)    Urinary retention  . Reglan [Metoclopramide] Other (See Comments)    Altered mental status  . Antihistamines, Chlorpheniramine-Type Other (See Comments)    Hallucinations  . Pyridium [Phenazopyridine Hcl] Hives and Other (See Comments)    Dizziness and indigestion   Family History  Problem Relation Age of Onset  . Heart disease Maternal Grandmother   . Hypertension Maternal Grandmother   . Hyperlipidemia Maternal Grandmother   . Cancer Maternal Grandfather   . Hypertension Maternal Grandfather   . Hypertension Mother   . Cancer Mother    PE: BP (!) 142/84 (BP Location: Left Arm, Patient Position: Sitting)   Pulse (!) 103   Wt 259 lb (117.5 kg)   SpO2 97%   BMI 36.12 kg/m  Body mass index is 36.12 kg/m. Wt Readings from Last 3 Encounters:  01/21/16 259 lb (117.5 kg)  10/20/15 237 lb 6.4 oz (107.7 kg)  10/01/15 229 lb 6.4 oz (104.1 kg)   Constitutional: overweight, in NAD Eyes: PERRLA, EOMI, no exophthalmos ENT: moist mucous membranes, no thyromegaly, no cervical lymphadenopathy Cardiovascular: Tachycardia, RR, No MRG Respiratory: CTA B Gastrointestinal: abdomen soft, NT, ND, BS+ Musculoskeletal: no deformities, strength intact in all 4 Skin: moist, warm, no rashes; no dark discoloration of skin Neurological: no tremor with outstretched hands, DTR 1/5 in all 4  ASSESSMENT: 1. Mild secondary adrenal insufficiency - brain MRI (02/09/2015) >> no pituitary tumor present.  PLAN:  1.  Mild adrenal insufficiency - pt's previous cortisol levels were low asthey were drawn after starting opiate medication. However, he had a normal stimulation test from 11/2014 and the mildly abnormal stimulation test in 06/2015. Based on this, but continued him on hydrocortisone: 10 mg in a.m. and 5 mg in p.m. He feels good on this dose, but started to have dizziness, dehydration, which are possibly related to his autonomic dysfunction. He has not returned to see his neurologist, but with do so. He has back pain for which she was on morphine in the past, which was discontinued approximately 2 months ago. Morphine is known to suppress ACTH and cause functional adrenal insufficiency. Unfortunately, he did not find a good alternative to morphine during his visits to pain medicine and steroid injections were again entertained. He has used this in the past and I believe that these are the cause for his current adrenal insufficiency. Moreover, he did not have good results in terms of pain management from the injections. He would like to return to see his PCP regarding the pain management.  - For now, we'll repeat the cosyntropin stimulation test. The ACTH was high at last check, which could be a sign of pituitary recovery. We will repeat this along with the stimulation test. Depending on the results of the test, he may be able to come off hydrocortisone or stay on the lower dose. However, if he is to restart morphine, we may need to revisit hydrocortisone doses.  - reinforced sick days rules:  If you cannot keep anything down, including your hydrocortisone medication, please go to the emergency room or your primary care physician office to get steroids injected in the muscle or vein. Alternatively, you can inject 100 mg hydrocortisone in the muscle at home.  If you have a fever (more than 100 Fahrenheit) or gastroenteritis with  nausea/vomiting and diarrhea, please double the dose of your hydrocortisone for the  duration of the fever or the gastroenteritis.  Do not run out of your hydrocortisone medication. - I will see the patient back in 6 months  Orders Placed This Encounter  Procedures  . Cortisol  . ACTH  . Cortisol  . Cortisol   Component     Latest Ref Rng & Units 01/24/2016 01/24/2016 01/24/2016         8:19 AM  9:01 AM  9:28 AM  C206 ACTH     6 - 50 pg/mL 21    Cortisol, Plasma     ug/dL 9.8 36.6 44.0  Normal cosyntropin stimulation test. At this point, I would suggest to stop hydrocortisone, however, keep tablet that hand in case he starts feeling poorly. I will also advised him to let me know about his symptoms if this happens.  Carlus Pavlov, MD PhD [facility] Endocrinology

## 2016-01-24 ENCOUNTER — Other Ambulatory Visit (INDEPENDENT_AMBULATORY_CARE_PROVIDER_SITE_OTHER): Payer: Medicare Other

## 2016-01-24 DIAGNOSIS — E2749 Other adrenocortical insufficiency: Secondary | ICD-10-CM | POA: Diagnosis not present

## 2016-01-24 LAB — CORTISOL
Cortisol, Plasma: 9.8 ug/dL
Cortisol, Plasma: 24.3 ug/dL
Cortisol, Plasma: 27.9 ug/dL

## 2016-01-24 MED ORDER — COSYNTROPIN NICU IV SYRINGE 0.25 MG/ML (STANDARD DOSE)
0.2500 mg | Freq: Once | INTRAVENOUS | Status: AC
  Administered 2016-01-24: 0.25 mg via INTRAMUSCULAR

## 2016-01-26 ENCOUNTER — Telehealth: Payer: Self-pay

## 2016-01-26 DIAGNOSIS — M79605 Pain in left leg: Secondary | ICD-10-CM | POA: Diagnosis not present

## 2016-01-26 DIAGNOSIS — M25551 Pain in right hip: Secondary | ICD-10-CM | POA: Diagnosis not present

## 2016-01-26 DIAGNOSIS — Z79891 Long term (current) use of opiate analgesic: Secondary | ICD-10-CM | POA: Diagnosis not present

## 2016-01-26 DIAGNOSIS — G894 Chronic pain syndrome: Secondary | ICD-10-CM | POA: Diagnosis not present

## 2016-01-26 DIAGNOSIS — M545 Low back pain: Secondary | ICD-10-CM | POA: Diagnosis not present

## 2016-01-26 LAB — ACTH: C206 ACTH: 21 pg/mL (ref 6–50)

## 2016-01-26 NOTE — Telephone Encounter (Signed)
Called patient and gave lab results. Patient had no questions or concerns.  

## 2016-02-23 DIAGNOSIS — G894 Chronic pain syndrome: Secondary | ICD-10-CM | POA: Diagnosis not present

## 2016-02-23 DIAGNOSIS — M25551 Pain in right hip: Secondary | ICD-10-CM | POA: Diagnosis not present

## 2016-02-23 DIAGNOSIS — M545 Low back pain: Secondary | ICD-10-CM | POA: Diagnosis not present

## 2016-02-23 DIAGNOSIS — Z79891 Long term (current) use of opiate analgesic: Secondary | ICD-10-CM | POA: Diagnosis not present

## 2016-02-23 DIAGNOSIS — G890 Central pain syndrome: Secondary | ICD-10-CM | POA: Diagnosis not present

## 2016-02-23 DIAGNOSIS — M79605 Pain in left leg: Secondary | ICD-10-CM | POA: Diagnosis not present

## 2016-03-14 DIAGNOSIS — E1165 Type 2 diabetes mellitus with hyperglycemia: Secondary | ICD-10-CM | POA: Diagnosis not present

## 2016-03-14 DIAGNOSIS — G8929 Other chronic pain: Secondary | ICD-10-CM | POA: Diagnosis not present

## 2016-03-14 DIAGNOSIS — M5442 Lumbago with sciatica, left side: Secondary | ICD-10-CM | POA: Diagnosis not present

## 2016-03-14 DIAGNOSIS — R350 Frequency of micturition: Secondary | ICD-10-CM | POA: Diagnosis not present

## 2016-03-14 DIAGNOSIS — R631 Polydipsia: Secondary | ICD-10-CM | POA: Diagnosis not present

## 2016-03-14 DIAGNOSIS — Z7984 Long term (current) use of oral hypoglycemic drugs: Secondary | ICD-10-CM | POA: Diagnosis not present

## 2016-03-14 DIAGNOSIS — Z125 Encounter for screening for malignant neoplasm of prostate: Secondary | ICD-10-CM | POA: Diagnosis not present

## 2016-03-14 DIAGNOSIS — M5441 Lumbago with sciatica, right side: Secondary | ICD-10-CM | POA: Diagnosis not present

## 2016-03-14 DIAGNOSIS — Z23 Encounter for immunization: Secondary | ICD-10-CM | POA: Diagnosis not present

## 2016-03-15 DIAGNOSIS — E1165 Type 2 diabetes mellitus with hyperglycemia: Secondary | ICD-10-CM | POA: Diagnosis not present

## 2016-03-15 DIAGNOSIS — Z7984 Long term (current) use of oral hypoglycemic drugs: Secondary | ICD-10-CM | POA: Diagnosis not present

## 2016-03-29 DIAGNOSIS — E1165 Type 2 diabetes mellitus with hyperglycemia: Secondary | ICD-10-CM | POA: Diagnosis not present

## 2016-03-29 DIAGNOSIS — H538 Other visual disturbances: Secondary | ICD-10-CM | POA: Diagnosis not present

## 2016-03-29 DIAGNOSIS — B353 Tinea pedis: Secondary | ICD-10-CM | POA: Diagnosis not present

## 2016-03-29 DIAGNOSIS — Z23 Encounter for immunization: Secondary | ICD-10-CM | POA: Diagnosis not present

## 2016-03-29 DIAGNOSIS — Z7984 Long term (current) use of oral hypoglycemic drugs: Secondary | ICD-10-CM | POA: Diagnosis not present

## 2016-03-29 DIAGNOSIS — E7800 Pure hypercholesterolemia, unspecified: Secondary | ICD-10-CM | POA: Diagnosis not present

## 2016-03-30 DIAGNOSIS — E1165 Type 2 diabetes mellitus with hyperglycemia: Secondary | ICD-10-CM | POA: Diagnosis not present

## 2016-03-30 DIAGNOSIS — H2513 Age-related nuclear cataract, bilateral: Secondary | ICD-10-CM | POA: Diagnosis not present

## 2016-03-30 DIAGNOSIS — E119 Type 2 diabetes mellitus without complications: Secondary | ICD-10-CM | POA: Diagnosis not present

## 2016-03-30 DIAGNOSIS — Z7984 Long term (current) use of oral hypoglycemic drugs: Secondary | ICD-10-CM | POA: Diagnosis not present

## 2016-03-30 DIAGNOSIS — E7800 Pure hypercholesterolemia, unspecified: Secondary | ICD-10-CM | POA: Diagnosis not present

## 2016-04-11 ENCOUNTER — Encounter: Payer: Medicare Other | Attending: Family Medicine | Admitting: Skilled Nursing Facility1

## 2016-04-11 ENCOUNTER — Encounter: Payer: Self-pay | Admitting: Skilled Nursing Facility1

## 2016-04-11 DIAGNOSIS — E1165 Type 2 diabetes mellitus with hyperglycemia: Secondary | ICD-10-CM | POA: Diagnosis not present

## 2016-04-11 DIAGNOSIS — E119 Type 2 diabetes mellitus without complications: Secondary | ICD-10-CM

## 2016-04-11 DIAGNOSIS — Z713 Dietary counseling and surveillance: Secondary | ICD-10-CM | POA: Insufficient documentation

## 2016-04-11 DIAGNOSIS — Z794 Long term (current) use of insulin: Secondary | ICD-10-CM | POA: Insufficient documentation

## 2016-04-11 NOTE — Progress Notes (Signed)
Patient was seen on 04/11/16 for the first of a series of three diabetes self-management courses at the [facility].  Patient Education Plan per assessed needs and concerns is to attend four course education program for Diabetes Self Management Education.  The following learning objectives were met by the patient during this class:  Describe diabetes  State some common risk factors for diabetes  Defines the role of glucose and insulin  Identifies type of diabetes and pathophysiology  Describe the relationship between diabetes and cardiovascular risk  State the members of the Healthcare Team  States the rationale for glucose monitoring  State when to test glucose  State their individual Target Range  State the importance of logging glucose readings  Describe how to interpret glucose readings  Identifies A1C target  Explain the correlation between A1c and eAG values  State symptoms and treatment of high blood glucose  State symptoms and treatment of low blood glucose  Explain proper technique for glucose testing  Identifies proper sharps disposal  Handouts given during class include:  Living Well with Diabetes book  Carb Counting and Meal Planning book  Meal Plan Card  Carbohydrate guide  Meal planning worksheet  Low Sodium Flavoring Tips  The diabetes portion plate  A1c to eAG Conversion Chart  Diabetes Medications  Diabetes Recommended Care Schedule  Support Group  Diabetes Success Plan  Core Class Satisfaction Survey  Follow-Up Plan:  Attend core 2  Patient was seen on 04/11/16 for the second of a series of three diabetes self-management courses at the [facility]. The following learning objectives were met by the patient during this class:   Describe the role of different macronutrients on glucose  Explain how carbohydrates affect blood glucose  State what foods contain the most  carbohydrates  Demonstrate carbohydrate counting  Demonstrate how to read Nutrition Facts food label  Describe effects of various fats on heart health  Describe the importance of good nutrition for health and healthy eating strategies  Describe techniques for managing your shopping, cooking and meal planning  List strategies to follow meal plan when dining out  Describe the effects of alcohol on glucose and how to use it safely  Goals:  Follow Diabetes Meal Plan as instructed  Eat 3 meals and 2 snacks, every 3-5 hrs  Limit carbohydrate intake to 45 grams carbohydrate/meal Limit carbohydrate intake to 15 grams carbohydrate/snack Add lean protein foods to meals/snacks  Monitor glucose levels as instructed by your doctor   Follow-Up Plan:  Attend Core 3  Work towards following your personal food plan.   

## 2016-04-18 ENCOUNTER — Encounter: Payer: Medicare Other | Admitting: Skilled Nursing Facility1

## 2016-04-18 DIAGNOSIS — Z713 Dietary counseling and surveillance: Secondary | ICD-10-CM | POA: Diagnosis not present

## 2016-04-18 DIAGNOSIS — E1165 Type 2 diabetes mellitus with hyperglycemia: Secondary | ICD-10-CM | POA: Diagnosis not present

## 2016-04-18 DIAGNOSIS — Z794 Long term (current) use of insulin: Secondary | ICD-10-CM | POA: Diagnosis not present

## 2016-04-18 DIAGNOSIS — E119 Type 2 diabetes mellitus without complications: Secondary | ICD-10-CM

## 2016-04-18 NOTE — Progress Notes (Signed)
Patient was seen on 04/18/2016 for the third of a series of three diabetes self-management courses at the [facility]. The following learning objectives were met by the patient during this class:  . State the amount of activity recommended for healthy living . Describe activities suitable for individual needs . Identify ways to regularly incorporate activity into daily life . Identify barriers to activity and ways to over come these barriers  Identify diabetes medications being personally used and their primary action for lowering glucose and possible side effects . Describe role of stress on blood glucose and develop strategies to address psychosocial issues . Identify diabetes complications and ways to prevent them  Explain how to manage diabetes during illness . Evaluate success in meeting personal goal . Establish 2-3 goals that they will plan to diligently work on until they return for the  43-monthfollow-up visit  Pt failed to check in prior to attending class and was gone by the time Dietitian realized this.   Goals:   I will count my carb choices at most meals and snacks  I will be active 45 minutes or more 3 times a week  I will take my diabetes medications as scheduled  I will eat less unhealthy fats by eating less smaller portions  I will test my glucose at least 2 times a day, 7 days a week  Your patient has identified these potential barriers to change:  Motivation  Your patient has identified their diabetes self-care support plan as  Family Support Plan:  Attend Monthly Diabetes Support Group as needed or make a future follow up appointment

## 2016-04-26 DIAGNOSIS — E7800 Pure hypercholesterolemia, unspecified: Secondary | ICD-10-CM | POA: Diagnosis not present

## 2016-04-26 DIAGNOSIS — Z7984 Long term (current) use of oral hypoglycemic drugs: Secondary | ICD-10-CM | POA: Diagnosis not present

## 2016-04-26 DIAGNOSIS — E1165 Type 2 diabetes mellitus with hyperglycemia: Secondary | ICD-10-CM | POA: Diagnosis not present

## 2016-05-22 DIAGNOSIS — G894 Chronic pain syndrome: Secondary | ICD-10-CM | POA: Diagnosis not present

## 2016-05-22 DIAGNOSIS — Z79891 Long term (current) use of opiate analgesic: Secondary | ICD-10-CM | POA: Diagnosis not present

## 2016-05-22 DIAGNOSIS — M79672 Pain in left foot: Secondary | ICD-10-CM | POA: Diagnosis not present

## 2016-05-22 DIAGNOSIS — M79671 Pain in right foot: Secondary | ICD-10-CM | POA: Diagnosis not present

## 2016-05-22 DIAGNOSIS — M545 Low back pain: Secondary | ICD-10-CM | POA: Diagnosis not present

## 2016-05-22 DIAGNOSIS — M25551 Pain in right hip: Secondary | ICD-10-CM | POA: Diagnosis not present

## 2016-07-05 DIAGNOSIS — E7800 Pure hypercholesterolemia, unspecified: Secondary | ICD-10-CM | POA: Diagnosis not present

## 2016-07-05 DIAGNOSIS — N529 Male erectile dysfunction, unspecified: Secondary | ICD-10-CM | POA: Diagnosis not present

## 2016-07-05 DIAGNOSIS — E1165 Type 2 diabetes mellitus with hyperglycemia: Secondary | ICD-10-CM | POA: Diagnosis not present

## 2016-07-05 DIAGNOSIS — Z7984 Long term (current) use of oral hypoglycemic drugs: Secondary | ICD-10-CM | POA: Diagnosis not present

## 2016-07-20 ENCOUNTER — Encounter: Payer: Self-pay | Admitting: Internal Medicine

## 2016-07-20 ENCOUNTER — Ambulatory Visit (INDEPENDENT_AMBULATORY_CARE_PROVIDER_SITE_OTHER): Payer: Medicare Other | Admitting: Internal Medicine

## 2016-07-20 VITALS — BP 128/72 | HR 104 | Wt 273.0 lb

## 2016-07-20 DIAGNOSIS — E119 Type 2 diabetes mellitus without complications: Secondary | ICD-10-CM | POA: Diagnosis not present

## 2016-07-20 DIAGNOSIS — E2749 Other adrenocortical insufficiency: Secondary | ICD-10-CM | POA: Diagnosis not present

## 2016-07-20 NOTE — Progress Notes (Signed)
Patient ID: [name], male   DOB: 1965/11/14, 51 y.o.   MRN: [id number]   HPI  Kham Zuckerman is a 51 y.o.-year-old male, returning for f/u for h/o secondary adrenal insufficiency, now resolved. He is here with his wife who offers most of the history. Last visit 6 months ago.  Since last visit, he had a very high CBG: >600 in 03/2016.   Latest HbA1c: Lab Results  Component Value Date   HGBA1C 4.9 03/22/2015   HGBA1C 4.9 03/22/2015   HGBA1C 6.5 (H) 12/04/2014   He was started on Metformin and Glimepiride by PCP. CBGs improved >> Amaryl needed to be decreased. Highest CBG 170.   He still c/o: - + nausea - no AP - + weight gain 36 lbs in last 9 mo - no HA  Reviewed and addended hx: Pt was dx with shingles in 09/2014 >> tx with Zovirax (no steroids) >> lower AP, dizziness and urinary retention - in need of bladder catheterization >> developed prostatitis >> ED visit >> sent home >> urinary retention and pain increased >> came back to the hospital >> admitted >> low cortisol >> started Florinef 0.1 mg daily and Midodrine 5 mg tid >> saw PCP post d/c >> orthostatic hypotension >> admitted again >> urinary retention worse >> stopped Midodrine and increased Florinef to 0.3 mg daily. During this admission a cosyntropin stimulation test was negative. He was started on Hydrocortisone pending further workup.  Upon questioning, a diagnosis of POTS was also entertained by the admitting team while in the hospital. He was later dx with autonomic dysfunction by nephrology.  Pt. has been found to have a low cortisol level summer 2016 (pt also had a positive UDS on 12/04/2014 >> he was on opiates then): Component     Latest Ref Rng 12/08/2014 (2:52 pm) 12/10/2014 (5:16 AM)   Cortisol - AM     6.7 - 22.6 ug/dL 5.0 (L)  4.5 (L)   He then had a cosyntropin stimulation test while in the hospital >> normal: Component     Latest Ref Rng 12/18/2014 (7:30 AM)   Cortisol, Base      4.4  Cortisol, 30  Min      17.6  Cortisol, 60 Min      23.4   An MRI of his pituitary (02/09/2015) returned normal.  He was also on Florinef 0.3 mg daily in am - as he had dizziness and orthostatic hypotension. No more "passing out" after increasing the dose of Florinef. He developed hypertension >> Fludrocortisone tapered to off.   We repeated the cosyntropin stimulation test: mildly abnormal (normal >> 18) >> mild adrenal insufficiency. However, th ACTH was higher than expected >> possibly recovery phase: Component     Latest Ref Rng & Units 07/23/2015 07/23/2015 07/23/2015         8:32 AM  9:03 AM  9:29 AM  C206 ACTH     6 - 50 pg/mL 66 (H)    Cortisol, Plasma     ug/dL 40.9 81.1 91.4   Indeed, a repeat cosyntropin stimulation test at next visit returned normal, at which point we stopped hydrocortisone: Component     Latest Ref Rng & Units 01/24/2016 01/24/2016 01/24/2016         8:19 AM  9:01 AM  9:28 AM  C206 ACTH     6 - 50 pg/mL 21    Cortisol, Plasma     ug/dL 9.8 78.2 95.6  Normal cosyntropin stimulation test. At  this point, I would suggest to stop hydrocortisone, however, keep tablet that hand in case he starts feeling poorly. I will also advised him to let me know about his symptoms if this happens.  He had severe back pain (spinal stenosis and degenerative disk ds) - disability since 2013 - 2 surgeries - steroid inj before last surgery in 2009. He is on  On Morphine IR 60 mg 4x a day >> now off. No h/o Megace, po ketoconazole, phenytoin, rifampin, chronic fluconazole use. + h/o autoimmune diseases in family mbs: DM1 in great grandparents (?) No h/o generalized infections or HIV. No IVDA. No h/o head injury or severe HA. No h/o malignancy.  A TSH was normal: Lab Results  Component Value Date   TSH 1.145 12/04/2014   He also has a history of cluster HAs (resolved). He has GERD and anemia.  ROS: Constitutional: + see HPI Eyes: no blurry vision, no xerophthalmia ENT: no sore throat,  no nodules palpated in throat, + dysphagia/no odynophagia, no hoarseness Cardiovascular: no CP/SOB/palpitations/no leg swelling Respiratory: no cough/SOB Gastrointestinal: + N/no V/no D/+ C Musculoskeletal: + muscle aches/+ joint aches Skin: no rashes Neurological: no tremors/numbness/tingling/+ dizziness  I reviewed pt's medications, allergies, PMH, social hx, family hx, and changes were documented in the history of present illness. Otherwise, unchanged from my initial visit note.  Past Medical History:  Diagnosis Date  . Autonomic dysfunction 10/01/2015  . Back pain   . DDD (degenerative disc disease), cervical   . Diabetes mellitus without complication (HCC)   . Essential hypertension 06/04/2015  . Headache    migraines  . Spinal stenosis    Past Surgical History:  Procedure Laterality Date  . BACK SURGERY  2002,2009   x 2, lower , job injury, spinal stenosis   Social History   Social History  . Marital status: Married    Spouse name: [name]  . Number of children: 4  . Years of education: 12   Occupational History  .      disabled   Social History Main Topics  . Smoking status: Former Smoker    Quit date: 05/02/2007  . Smokeless tobacco: Never Used  . Alcohol use No  . Drug use: No  . Sexual activity: Yes   Other Topics Concern  . Not on file   Social History Narrative   Lives at home with wife, children   Caffeine use- 1 soda a day         Epworth Sleepiness Scale = 4 (as of 03/17/15)   Current Outpatient Prescriptions on File Prior to Visit  Medication Sig Dispense Refill  . Docusate Sodium (COLACE PO) Take by mouth.    . gabapentin (NEURONTIN) 300 MG capsule Take 2 capsules 3 times a day. 180 capsule 11  . meloxicam (MOBIC) 15 MG tablet Take 15 mg by mouth daily.  0  . ondansetron (ZOFRAN ODT) 8 MG disintegrating tablet 8mg  ODT q8 hours prn nausea 12 tablet 0   No current facility-administered medications on file prior to visit.    Allergies  Allergen  Reactions  . Suboxone [Buprenorphine Hcl-Naloxone Hcl] Itching and Nausea Only    dizziness  . Midodrine Hcl Other (See Comments)    Urinary retention  . Reglan [Metoclopramide] Other (See Comments)    Altered mental status  . Antihistamines, Chlorpheniramine-Type Other (See Comments)    Hallucinations  . Pyridium [Phenazopyridine Hcl] Hives and Other (See Comments)    Dizziness and indigestion   Family History  Problem Relation Age of Onset  . Heart disease Maternal Grandmother   . Hypertension Maternal Grandmother   . Hyperlipidemia Maternal Grandmother   . Cancer Maternal Grandfather   . Hypertension Maternal Grandfather   . Hypertension Mother   . Cancer Mother    PE: BP 128/72 (BP Location: Left Arm, Patient Position: Sitting)   Pulse (!) 104   Wt 273 lb (123.8 kg)   SpO2 96%   BMI 38.08 kg/m  Body mass index is 38.08 kg/m. Wt Readings from Last 3 Encounters:  07/20/16 273 lb (123.8 kg)  01/21/16 259 lb (117.5 kg)  10/20/15 237 lb 6.4 oz (107.7 kg)   Constitutional: overweight, in NAD Eyes: PERRLA, EOMI, no exophthalmos ENT: moist mucous membranes, no thyromegaly, no cervical lymphadenopathy Cardiovascular: Tachycardia, RR, No MRG Respiratory: CTA B Gastrointestinal: abdomen soft, NT, ND, BS+ Musculoskeletal: no deformities, strength intact in all 4 Skin: moist, warm, no rashes; no dark discoloration of skin Neurological: no tremor with outstretched hands, DTR 1/5 in all 4  ASSESSMENT: 1. H/o Mild secondary adrenal insufficiency >> resolved - brain MRI (02/09/2015) >> no pituitary tumor present.  2. DM2  PLAN:  1. H/o Mild adrenal insufficiency - pt's previous cortisol levels were low asthey were drawn after starting opiate medication. However, he had a normal stimulation test from 11/2014 and a mildly abnormal stimulation test in 06/2015. Based on this, but continued him on hydrocortisone: 10 mg in a.m. and 5 mg in p.m. but 2 repeated stimulation tests were  normal >> we stopped HC 6 mo ago >> he is not having any sxs and he did not have to go to the ED with dehydration again. He is completely off opiates and I explained that his Central AI is now resolved. -- I will see the patient back as needed  2. DM2 - non insulin dependent - managed by PCP - controlled now - will see him for this problem if needed in the future  Carlus Pavlov, MD PhD [facility] Endocrinology

## 2016-07-20 NOTE — Patient Instructions (Signed)
Please return to see me as needed. °

## 2016-09-11 DIAGNOSIS — M25511 Pain in right shoulder: Secondary | ICD-10-CM | POA: Diagnosis not present

## 2016-09-11 DIAGNOSIS — G894 Chronic pain syndrome: Secondary | ICD-10-CM | POA: Diagnosis not present

## 2016-09-11 DIAGNOSIS — M25512 Pain in left shoulder: Secondary | ICD-10-CM | POA: Diagnosis not present

## 2016-09-11 DIAGNOSIS — M79604 Pain in right leg: Secondary | ICD-10-CM | POA: Diagnosis not present

## 2016-09-11 DIAGNOSIS — M79662 Pain in left lower leg: Secondary | ICD-10-CM | POA: Diagnosis not present

## 2016-09-11 DIAGNOSIS — G890 Central pain syndrome: Secondary | ICD-10-CM | POA: Diagnosis not present

## 2016-09-11 DIAGNOSIS — M5417 Radiculopathy, lumbosacral region: Secondary | ICD-10-CM | POA: Diagnosis not present

## 2016-09-11 DIAGNOSIS — M25551 Pain in right hip: Secondary | ICD-10-CM | POA: Diagnosis not present

## 2016-09-15 DIAGNOSIS — B353 Tinea pedis: Secondary | ICD-10-CM | POA: Diagnosis not present

## 2016-09-15 DIAGNOSIS — N529 Male erectile dysfunction, unspecified: Secondary | ICD-10-CM | POA: Diagnosis not present

## 2016-09-15 DIAGNOSIS — E7800 Pure hypercholesterolemia, unspecified: Secondary | ICD-10-CM | POA: Diagnosis not present

## 2016-09-15 DIAGNOSIS — Z79899 Other long term (current) drug therapy: Secondary | ICD-10-CM | POA: Diagnosis not present

## 2016-09-15 DIAGNOSIS — Z7984 Long term (current) use of oral hypoglycemic drugs: Secondary | ICD-10-CM | POA: Diagnosis not present

## 2016-09-15 DIAGNOSIS — Z0001 Encounter for general adult medical examination with abnormal findings: Secondary | ICD-10-CM | POA: Diagnosis not present

## 2016-09-15 DIAGNOSIS — E1165 Type 2 diabetes mellitus with hyperglycemia: Secondary | ICD-10-CM | POA: Diagnosis not present

## 2016-10-16 DIAGNOSIS — E1165 Type 2 diabetes mellitus with hyperglycemia: Secondary | ICD-10-CM | POA: Diagnosis not present

## 2016-10-16 DIAGNOSIS — Z79899 Other long term (current) drug therapy: Secondary | ICD-10-CM | POA: Diagnosis not present

## 2016-10-31 DIAGNOSIS — H0014 Chalazion left upper eyelid: Secondary | ICD-10-CM | POA: Diagnosis not present

## 2016-10-31 DIAGNOSIS — H10413 Chronic giant papillary conjunctivitis, bilateral: Secondary | ICD-10-CM | POA: Diagnosis not present

## 2016-12-01 DIAGNOSIS — H10413 Chronic giant papillary conjunctivitis, bilateral: Secondary | ICD-10-CM | POA: Diagnosis not present

## 2016-12-01 DIAGNOSIS — H0014 Chalazion left upper eyelid: Secondary | ICD-10-CM | POA: Diagnosis not present

## 2016-12-08 DIAGNOSIS — G894 Chronic pain syndrome: Secondary | ICD-10-CM | POA: Diagnosis not present

## 2016-12-08 DIAGNOSIS — M79661 Pain in right lower leg: Secondary | ICD-10-CM | POA: Diagnosis not present

## 2016-12-08 DIAGNOSIS — M25511 Pain in right shoulder: Secondary | ICD-10-CM | POA: Diagnosis not present

## 2016-12-08 DIAGNOSIS — M5417 Radiculopathy, lumbosacral region: Secondary | ICD-10-CM | POA: Diagnosis not present

## 2016-12-08 DIAGNOSIS — M79662 Pain in left lower leg: Secondary | ICD-10-CM | POA: Diagnosis not present

## 2017-01-18 DIAGNOSIS — E7800 Pure hypercholesterolemia, unspecified: Secondary | ICD-10-CM | POA: Diagnosis not present

## 2017-01-18 DIAGNOSIS — Z23 Encounter for immunization: Secondary | ICD-10-CM | POA: Diagnosis not present

## 2017-01-18 DIAGNOSIS — E1165 Type 2 diabetes mellitus with hyperglycemia: Secondary | ICD-10-CM | POA: Diagnosis not present

## 2017-03-06 DIAGNOSIS — E119 Type 2 diabetes mellitus without complications: Secondary | ICD-10-CM | POA: Diagnosis not present

## 2017-03-06 DIAGNOSIS — H0014 Chalazion left upper eyelid: Secondary | ICD-10-CM | POA: Diagnosis not present

## 2017-03-06 DIAGNOSIS — H10413 Chronic giant papillary conjunctivitis, bilateral: Secondary | ICD-10-CM | POA: Diagnosis not present

## 2017-03-09 DIAGNOSIS — M25551 Pain in right hip: Secondary | ICD-10-CM | POA: Diagnosis not present

## 2017-03-09 DIAGNOSIS — M79672 Pain in left foot: Secondary | ICD-10-CM | POA: Diagnosis not present

## 2017-03-09 DIAGNOSIS — G894 Chronic pain syndrome: Secondary | ICD-10-CM | POA: Diagnosis not present

## 2017-03-09 DIAGNOSIS — M545 Low back pain: Secondary | ICD-10-CM | POA: Diagnosis not present

## 2017-03-09 DIAGNOSIS — Z79891 Long term (current) use of opiate analgesic: Secondary | ICD-10-CM | POA: Diagnosis not present

## 2017-03-09 DIAGNOSIS — M79671 Pain in right foot: Secondary | ICD-10-CM | POA: Diagnosis not present

## 2017-06-01 DIAGNOSIS — M79661 Pain in right lower leg: Secondary | ICD-10-CM | POA: Diagnosis not present

## 2017-06-01 DIAGNOSIS — M25552 Pain in left hip: Secondary | ICD-10-CM | POA: Diagnosis not present

## 2017-06-01 DIAGNOSIS — M545 Low back pain: Secondary | ICD-10-CM | POA: Diagnosis not present

## 2017-06-01 DIAGNOSIS — M25551 Pain in right hip: Secondary | ICD-10-CM | POA: Diagnosis not present

## 2017-06-01 DIAGNOSIS — G894 Chronic pain syndrome: Secondary | ICD-10-CM | POA: Diagnosis not present

## 2017-06-01 DIAGNOSIS — M542 Cervicalgia: Secondary | ICD-10-CM | POA: Diagnosis not present

## 2017-06-01 DIAGNOSIS — M79662 Pain in left lower leg: Secondary | ICD-10-CM | POA: Diagnosis not present

## 2017-07-19 DIAGNOSIS — M545 Low back pain: Secondary | ICD-10-CM | POA: Diagnosis not present

## 2017-07-19 DIAGNOSIS — Z79899 Other long term (current) drug therapy: Secondary | ICD-10-CM | POA: Diagnosis not present

## 2017-07-19 DIAGNOSIS — E7800 Pure hypercholesterolemia, unspecified: Secondary | ICD-10-CM | POA: Diagnosis not present

## 2017-07-19 DIAGNOSIS — R51 Headache: Secondary | ICD-10-CM | POA: Diagnosis not present

## 2017-07-19 DIAGNOSIS — Z7984 Long term (current) use of oral hypoglycemic drugs: Secondary | ICD-10-CM | POA: Diagnosis not present

## 2017-07-19 DIAGNOSIS — E1165 Type 2 diabetes mellitus with hyperglycemia: Secondary | ICD-10-CM | POA: Diagnosis not present

## 2017-07-28 DIAGNOSIS — M5136 Other intervertebral disc degeneration, lumbar region: Secondary | ICD-10-CM | POA: Diagnosis not present

## 2017-07-28 DIAGNOSIS — M5030 Other cervical disc degeneration, unspecified cervical region: Secondary | ICD-10-CM | POA: Diagnosis not present

## 2017-07-28 DIAGNOSIS — M545 Low back pain: Secondary | ICD-10-CM | POA: Diagnosis not present

## 2017-07-28 DIAGNOSIS — E119 Type 2 diabetes mellitus without complications: Secondary | ICD-10-CM | POA: Diagnosis not present

## 2017-07-28 DIAGNOSIS — Z6839 Body mass index (BMI) 39.0-39.9, adult: Secondary | ICD-10-CM | POA: Diagnosis not present

## 2017-07-28 DIAGNOSIS — M5416 Radiculopathy, lumbar region: Secondary | ICD-10-CM | POA: Diagnosis not present

## 2017-07-28 DIAGNOSIS — M542 Cervicalgia: Secondary | ICD-10-CM | POA: Diagnosis not present

## 2017-08-04 DIAGNOSIS — M545 Low back pain: Secondary | ICD-10-CM | POA: Diagnosis not present

## 2017-08-10 DIAGNOSIS — M47812 Spondylosis without myelopathy or radiculopathy, cervical region: Secondary | ICD-10-CM | POA: Diagnosis not present

## 2017-08-10 DIAGNOSIS — M545 Low back pain: Secondary | ICD-10-CM | POA: Diagnosis not present

## 2017-08-10 DIAGNOSIS — M5136 Other intervertebral disc degeneration, lumbar region: Secondary | ICD-10-CM | POA: Diagnosis not present

## 2017-08-10 DIAGNOSIS — M961 Postlaminectomy syndrome, not elsewhere classified: Secondary | ICD-10-CM | POA: Diagnosis not present

## 2017-08-30 DIAGNOSIS — M545 Low back pain: Secondary | ICD-10-CM | POA: Diagnosis not present

## 2017-08-30 DIAGNOSIS — M546 Pain in thoracic spine: Secondary | ICD-10-CM | POA: Diagnosis not present

## 2017-08-30 DIAGNOSIS — M79661 Pain in right lower leg: Secondary | ICD-10-CM | POA: Diagnosis not present

## 2017-08-30 DIAGNOSIS — M79671 Pain in right foot: Secondary | ICD-10-CM | POA: Diagnosis not present

## 2017-08-30 DIAGNOSIS — M79604 Pain in right leg: Secondary | ICD-10-CM | POA: Diagnosis not present

## 2017-08-30 DIAGNOSIS — M79672 Pain in left foot: Secondary | ICD-10-CM | POA: Diagnosis not present

## 2017-08-30 DIAGNOSIS — M79662 Pain in left lower leg: Secondary | ICD-10-CM | POA: Diagnosis not present

## 2017-08-30 DIAGNOSIS — G894 Chronic pain syndrome: Secondary | ICD-10-CM | POA: Diagnosis not present

## 2017-08-30 DIAGNOSIS — M25552 Pain in left hip: Secondary | ICD-10-CM | POA: Diagnosis not present

## 2017-08-30 DIAGNOSIS — M79605 Pain in left leg: Secondary | ICD-10-CM | POA: Diagnosis not present

## 2017-08-30 DIAGNOSIS — M25551 Pain in right hip: Secondary | ICD-10-CM | POA: Diagnosis not present

## 2017-09-15 DIAGNOSIS — M961 Postlaminectomy syndrome, not elsewhere classified: Secondary | ICD-10-CM | POA: Diagnosis not present

## 2017-09-15 DIAGNOSIS — M5136 Other intervertebral disc degeneration, lumbar region: Secondary | ICD-10-CM | POA: Diagnosis not present

## 2017-10-03 DIAGNOSIS — M961 Postlaminectomy syndrome, not elsewhere classified: Secondary | ICD-10-CM | POA: Diagnosis not present

## 2017-10-03 DIAGNOSIS — M5136 Other intervertebral disc degeneration, lumbar region: Secondary | ICD-10-CM | POA: Diagnosis not present

## 2017-10-03 DIAGNOSIS — M5416 Radiculopathy, lumbar region: Secondary | ICD-10-CM | POA: Diagnosis not present

## 2017-10-30 DIAGNOSIS — R030 Elevated blood-pressure reading, without diagnosis of hypertension: Secondary | ICD-10-CM | POA: Diagnosis not present

## 2017-10-30 DIAGNOSIS — Z6837 Body mass index (BMI) 37.0-37.9, adult: Secondary | ICD-10-CM | POA: Diagnosis not present

## 2017-10-30 DIAGNOSIS — M961 Postlaminectomy syndrome, not elsewhere classified: Secondary | ICD-10-CM | POA: Diagnosis not present

## 2018-01-02 DIAGNOSIS — G894 Chronic pain syndrome: Secondary | ICD-10-CM | POA: Diagnosis not present

## 2018-01-02 DIAGNOSIS — M5136 Other intervertebral disc degeneration, lumbar region: Secondary | ICD-10-CM | POA: Diagnosis not present

## 2018-01-30 DIAGNOSIS — E119 Type 2 diabetes mellitus without complications: Secondary | ICD-10-CM | POA: Diagnosis not present

## 2018-01-30 DIAGNOSIS — Z7984 Long term (current) use of oral hypoglycemic drugs: Secondary | ICD-10-CM | POA: Diagnosis not present

## 2018-01-30 DIAGNOSIS — E7800 Pure hypercholesterolemia, unspecified: Secondary | ICD-10-CM | POA: Diagnosis not present

## 2018-01-30 DIAGNOSIS — Z23 Encounter for immunization: Secondary | ICD-10-CM | POA: Diagnosis not present

## 2018-01-30 DIAGNOSIS — Z0000 Encounter for general adult medical examination without abnormal findings: Secondary | ICD-10-CM | POA: Diagnosis not present

## 2018-01-30 DIAGNOSIS — Z125 Encounter for screening for malignant neoplasm of prostate: Secondary | ICD-10-CM | POA: Diagnosis not present

## 2018-01-30 DIAGNOSIS — Z1211 Encounter for screening for malignant neoplasm of colon: Secondary | ICD-10-CM | POA: Diagnosis not present

## 2018-03-07 DIAGNOSIS — H10413 Chronic giant papillary conjunctivitis, bilateral: Secondary | ICD-10-CM | POA: Diagnosis not present

## 2018-03-07 DIAGNOSIS — E119 Type 2 diabetes mellitus without complications: Secondary | ICD-10-CM | POA: Diagnosis not present

## 2018-03-07 DIAGNOSIS — H1712 Central corneal opacity, left eye: Secondary | ICD-10-CM | POA: Diagnosis not present

## 2018-03-07 DIAGNOSIS — H40053 Ocular hypertension, bilateral: Secondary | ICD-10-CM | POA: Diagnosis not present

## 2018-03-07 DIAGNOSIS — H02054 Trichiasis without entropian left upper eyelid: Secondary | ICD-10-CM | POA: Diagnosis not present

## 2018-03-07 DIAGNOSIS — H2513 Age-related nuclear cataract, bilateral: Secondary | ICD-10-CM | POA: Diagnosis not present

## 2018-03-07 DIAGNOSIS — H0014 Chalazion left upper eyelid: Secondary | ICD-10-CM | POA: Diagnosis not present

## 2018-04-15 DIAGNOSIS — K640 First degree hemorrhoids: Secondary | ICD-10-CM | POA: Diagnosis not present

## 2018-04-15 DIAGNOSIS — Z1211 Encounter for screening for malignant neoplasm of colon: Secondary | ICD-10-CM | POA: Diagnosis not present

## 2018-04-29 DIAGNOSIS — M545 Low back pain: Secondary | ICD-10-CM | POA: Diagnosis not present

## 2018-05-07 DIAGNOSIS — H02054 Trichiasis without entropian left upper eyelid: Secondary | ICD-10-CM | POA: Diagnosis not present

## 2018-05-07 DIAGNOSIS — H40053 Ocular hypertension, bilateral: Secondary | ICD-10-CM | POA: Diagnosis not present

## 2018-05-07 DIAGNOSIS — E119 Type 2 diabetes mellitus without complications: Secondary | ICD-10-CM | POA: Diagnosis not present

## 2018-05-07 DIAGNOSIS — H1712 Central corneal opacity, left eye: Secondary | ICD-10-CM | POA: Diagnosis not present

## 2018-05-07 DIAGNOSIS — H10413 Chronic giant papillary conjunctivitis, bilateral: Secondary | ICD-10-CM | POA: Diagnosis not present

## 2018-05-07 DIAGNOSIS — H2513 Age-related nuclear cataract, bilateral: Secondary | ICD-10-CM | POA: Diagnosis not present

## 2018-08-01 DIAGNOSIS — E1165 Type 2 diabetes mellitus with hyperglycemia: Secondary | ICD-10-CM | POA: Diagnosis not present

## 2018-08-01 DIAGNOSIS — E7800 Pure hypercholesterolemia, unspecified: Secondary | ICD-10-CM | POA: Diagnosis not present

## 2018-08-01 DIAGNOSIS — Z7984 Long term (current) use of oral hypoglycemic drugs: Secondary | ICD-10-CM | POA: Diagnosis not present

## 2018-08-01 DIAGNOSIS — M5441 Lumbago with sciatica, right side: Secondary | ICD-10-CM | POA: Diagnosis not present

## 2018-12-17 DIAGNOSIS — H10413 Chronic giant papillary conjunctivitis, bilateral: Secondary | ICD-10-CM | POA: Diagnosis not present

## 2018-12-17 DIAGNOSIS — H2513 Age-related nuclear cataract, bilateral: Secondary | ICD-10-CM | POA: Diagnosis not present

## 2018-12-17 DIAGNOSIS — H02054 Trichiasis without entropian left upper eyelid: Secondary | ICD-10-CM | POA: Diagnosis not present

## 2018-12-17 DIAGNOSIS — H40053 Ocular hypertension, bilateral: Secondary | ICD-10-CM | POA: Diagnosis not present

## 2018-12-17 DIAGNOSIS — H1712 Central corneal opacity, left eye: Secondary | ICD-10-CM | POA: Diagnosis not present

## 2018-12-17 DIAGNOSIS — E119 Type 2 diabetes mellitus without complications: Secondary | ICD-10-CM | POA: Diagnosis not present

## 2019-02-10 DIAGNOSIS — Z0000 Encounter for general adult medical examination without abnormal findings: Secondary | ICD-10-CM | POA: Diagnosis not present

## 2019-02-10 DIAGNOSIS — Z125 Encounter for screening for malignant neoplasm of prostate: Secondary | ICD-10-CM | POA: Diagnosis not present

## 2019-02-10 DIAGNOSIS — E1169 Type 2 diabetes mellitus with other specified complication: Secondary | ICD-10-CM | POA: Diagnosis not present

## 2019-02-10 DIAGNOSIS — Z7984 Long term (current) use of oral hypoglycemic drugs: Secondary | ICD-10-CM | POA: Diagnosis not present

## 2019-02-10 DIAGNOSIS — E7800 Pure hypercholesterolemia, unspecified: Secondary | ICD-10-CM | POA: Diagnosis not present

## 2019-02-10 DIAGNOSIS — Z79899 Other long term (current) drug therapy: Secondary | ICD-10-CM | POA: Diagnosis not present

## 2019-02-10 DIAGNOSIS — N529 Male erectile dysfunction, unspecified: Secondary | ICD-10-CM | POA: Diagnosis not present

## 2019-02-10 DIAGNOSIS — I10 Essential (primary) hypertension: Secondary | ICD-10-CM | POA: Diagnosis not present

## 2019-02-10 DIAGNOSIS — Z23 Encounter for immunization: Secondary | ICD-10-CM | POA: Diagnosis not present

## 2019-02-24 DIAGNOSIS — Z79899 Other long term (current) drug therapy: Secondary | ICD-10-CM | POA: Diagnosis not present

## 2019-04-01 DIAGNOSIS — U071 COVID-19: Secondary | ICD-10-CM

## 2019-04-01 HISTORY — DX: COVID-19: U07.1

## 2019-05-08 DIAGNOSIS — R0602 Shortness of breath: Secondary | ICD-10-CM | POA: Diagnosis not present

## 2019-05-08 DIAGNOSIS — U071 COVID-19: Secondary | ICD-10-CM | POA: Diagnosis not present

## 2019-06-03 DIAGNOSIS — R413 Other amnesia: Secondary | ICD-10-CM | POA: Diagnosis not present

## 2019-06-03 DIAGNOSIS — E1169 Type 2 diabetes mellitus with other specified complication: Secondary | ICD-10-CM | POA: Diagnosis not present

## 2019-06-03 DIAGNOSIS — Z7984 Long term (current) use of oral hypoglycemic drugs: Secondary | ICD-10-CM | POA: Diagnosis not present

## 2019-06-03 DIAGNOSIS — R0602 Shortness of breath: Secondary | ICD-10-CM | POA: Diagnosis not present

## 2019-06-04 ENCOUNTER — Other Ambulatory Visit: Payer: Self-pay | Admitting: Family Medicine

## 2019-06-04 ENCOUNTER — Ambulatory Visit
Admission: RE | Admit: 2019-06-04 | Discharge: 2019-06-04 | Disposition: A | Discharge status: Home / Self Care | Payer: Medicare Other | Source: Ambulatory Visit | Attending: Family Medicine | Admitting: Family Medicine

## 2019-06-04 DIAGNOSIS — R0602 Shortness of breath: Secondary | ICD-10-CM

## 2019-06-09 DIAGNOSIS — Z7984 Long term (current) use of oral hypoglycemic drugs: Secondary | ICD-10-CM | POA: Diagnosis not present

## 2019-06-09 DIAGNOSIS — R413 Other amnesia: Secondary | ICD-10-CM | POA: Diagnosis not present

## 2019-06-09 DIAGNOSIS — E1169 Type 2 diabetes mellitus with other specified complication: Secondary | ICD-10-CM | POA: Diagnosis not present

## 2019-06-16 DIAGNOSIS — H1712 Central corneal opacity, left eye: Secondary | ICD-10-CM | POA: Diagnosis not present

## 2019-06-16 DIAGNOSIS — H10413 Chronic giant papillary conjunctivitis, bilateral: Secondary | ICD-10-CM | POA: Diagnosis not present

## 2019-06-16 DIAGNOSIS — H2513 Age-related nuclear cataract, bilateral: Secondary | ICD-10-CM | POA: Diagnosis not present

## 2019-06-16 DIAGNOSIS — H02054 Trichiasis without entropian left upper eyelid: Secondary | ICD-10-CM | POA: Diagnosis not present

## 2019-06-16 DIAGNOSIS — H5203 Hypermetropia, bilateral: Secondary | ICD-10-CM | POA: Diagnosis not present

## 2019-06-16 DIAGNOSIS — H40023 Open angle with borderline findings, high risk, bilateral: Secondary | ICD-10-CM | POA: Diagnosis not present

## 2019-06-16 DIAGNOSIS — H52203 Unspecified astigmatism, bilateral: Secondary | ICD-10-CM | POA: Diagnosis not present

## 2019-06-16 DIAGNOSIS — E119 Type 2 diabetes mellitus without complications: Secondary | ICD-10-CM | POA: Diagnosis not present

## 2019-06-16 DIAGNOSIS — H524 Presbyopia: Secondary | ICD-10-CM | POA: Diagnosis not present

## 2019-07-16 DIAGNOSIS — H02054 Trichiasis without entropian left upper eyelid: Secondary | ICD-10-CM | POA: Diagnosis not present

## 2019-07-16 DIAGNOSIS — H40023 Open angle with borderline findings, high risk, bilateral: Secondary | ICD-10-CM | POA: Diagnosis not present

## 2019-07-31 DIAGNOSIS — H1013 Acute atopic conjunctivitis, bilateral: Secondary | ICD-10-CM | POA: Diagnosis not present

## 2019-07-31 DIAGNOSIS — H40023 Open angle with borderline findings, high risk, bilateral: Secondary | ICD-10-CM | POA: Diagnosis not present

## 2019-07-31 DIAGNOSIS — H2513 Age-related nuclear cataract, bilateral: Secondary | ICD-10-CM | POA: Diagnosis not present

## 2019-08-13 DIAGNOSIS — R413 Other amnesia: Secondary | ICD-10-CM | POA: Diagnosis not present

## 2019-08-13 DIAGNOSIS — E7800 Pure hypercholesterolemia, unspecified: Secondary | ICD-10-CM | POA: Diagnosis not present

## 2019-08-13 DIAGNOSIS — E1169 Type 2 diabetes mellitus with other specified complication: Secondary | ICD-10-CM | POA: Diagnosis not present

## 2019-08-13 DIAGNOSIS — Z79899 Other long term (current) drug therapy: Secondary | ICD-10-CM | POA: Diagnosis not present

## 2019-08-13 DIAGNOSIS — Z7984 Long term (current) use of oral hypoglycemic drugs: Secondary | ICD-10-CM | POA: Diagnosis not present

## 2019-08-13 DIAGNOSIS — I10 Essential (primary) hypertension: Secondary | ICD-10-CM | POA: Diagnosis not present

## 2019-09-10 DIAGNOSIS — E7800 Pure hypercholesterolemia, unspecified: Secondary | ICD-10-CM | POA: Diagnosis not present

## 2019-09-10 DIAGNOSIS — Z79899 Other long term (current) drug therapy: Secondary | ICD-10-CM | POA: Diagnosis not present

## 2019-09-10 DIAGNOSIS — E1169 Type 2 diabetes mellitus with other specified complication: Secondary | ICD-10-CM | POA: Diagnosis not present

## 2019-09-10 DIAGNOSIS — Z7984 Long term (current) use of oral hypoglycemic drugs: Secondary | ICD-10-CM | POA: Diagnosis not present

## 2019-09-12 DIAGNOSIS — H0102A Squamous blepharitis right eye, upper and lower eyelids: Secondary | ICD-10-CM | POA: Diagnosis not present

## 2019-09-12 DIAGNOSIS — H0102B Squamous blepharitis left eye, upper and lower eyelids: Secondary | ICD-10-CM | POA: Diagnosis not present

## 2019-09-12 DIAGNOSIS — H04123 Dry eye syndrome of bilateral lacrimal glands: Secondary | ICD-10-CM | POA: Diagnosis not present

## 2019-09-12 DIAGNOSIS — H1013 Acute atopic conjunctivitis, bilateral: Secondary | ICD-10-CM | POA: Diagnosis not present

## 2019-09-12 DIAGNOSIS — H40023 Open angle with borderline findings, high risk, bilateral: Secondary | ICD-10-CM | POA: Diagnosis not present

## 2019-09-15 ENCOUNTER — Encounter: Payer: Self-pay | Admitting: *Deleted

## 2019-09-16 ENCOUNTER — Telehealth: Payer: Self-pay | Admitting: Diagnostic Neuroimaging

## 2019-09-16 ENCOUNTER — Ambulatory Visit (INDEPENDENT_AMBULATORY_CARE_PROVIDER_SITE_OTHER): Payer: Medicare Other | Admitting: Diagnostic Neuroimaging

## 2019-09-16 ENCOUNTER — Encounter: Payer: Self-pay | Admitting: Diagnostic Neuroimaging

## 2019-09-16 ENCOUNTER — Other Ambulatory Visit: Payer: Self-pay

## 2019-09-16 VITALS — BP 122/79 | HR 74 | Ht 70.0 in | Wt 272.2 lb

## 2019-09-16 DIAGNOSIS — G933 Postviral fatigue syndrome: Secondary | ICD-10-CM | POA: Diagnosis not present

## 2019-09-16 DIAGNOSIS — G4700 Insomnia, unspecified: Secondary | ICD-10-CM

## 2019-09-16 DIAGNOSIS — U071 COVID-19: Secondary | ICD-10-CM

## 2019-09-16 DIAGNOSIS — E119 Type 2 diabetes mellitus without complications: Secondary | ICD-10-CM | POA: Diagnosis not present

## 2019-09-16 DIAGNOSIS — F419 Anxiety disorder, unspecified: Secondary | ICD-10-CM | POA: Diagnosis not present

## 2019-09-16 DIAGNOSIS — R413 Other amnesia: Secondary | ICD-10-CM

## 2019-09-16 DIAGNOSIS — G9331 Postviral fatigue syndrome: Secondary | ICD-10-CM

## 2019-09-16 DIAGNOSIS — G43109 Migraine with aura, not intractable, without status migrainosus: Secondary | ICD-10-CM

## 2019-09-16 MED ORDER — ALPRAZOLAM 0.5 MG PO TABS
ORAL_TABLET | ORAL | 0 refills | Status: DC
Start: 2019-09-16 — End: 2020-04-14

## 2019-09-16 MED ORDER — RIZATRIPTAN BENZOATE 10 MG PO TBDP
10.0000 mg | ORAL_TABLET | ORAL | 11 refills | Status: DC | PRN
Start: 2019-09-16 — End: 2020-04-14

## 2019-09-16 MED ORDER — AMITRIPTYLINE HCL 25 MG PO TABS
25.0000 mg | ORAL_TABLET | Freq: Every day | ORAL | 6 refills | Status: DC
Start: 2019-09-16 — End: 2020-04-14

## 2019-09-16 NOTE — Progress Notes (Signed)
[facility]  PATIENT: Jimmy [name] DOB: March 29, 1966  REFERRING CLINICIAN: Daub, S HISTORY FROM: patient and wife  REASON FOR VISIT: follow up    HISTORICAL  CHIEF COMPLAINT:  Chief Complaint  Patient presents with  . Memory changes, headaches    rm 6, wife- Harmon Pier "daily headaches when I wake up, Tylenol not helpful; forgetting where I am going, forget what was going to do or a conversation"    HISTORY OF PRESENT ILLNESS:   UPDATE (09/16/19, VRP): Since last visit, dysautonomia symptoms resolved in ~2018. BP and bladder issues resolved.   Now in Dec 2020 had COVID infx x 3 wks; now post-infx HA, memory and anxiety issues.   UPDATE 07/12/15: Since last visit, now off droxidopa. BP more stable (98/57 - 149/82). Now able to walk without walker for short distances. Unfortunately, IVIG not covered in outpatient infusion center. Numbness / pain stable, but slightly controlled with gabapentin. Lightheadedness still worse in evening.   UPDATE 03/08/15: Since last visit, now on northera 100mg  TID. Some supine hypertension (max SBP 183/99; but rare). Now with new onset of numbness in hands x 1 month, and numbness in toes since Aug 2016.   UPDATE 02/10/15: Since last visit, no new events. Still with orthostatic hypotension and lightheadedness and dizziness. Also struggling with severe low back pain radiating to the legs. Still on florinef.   PRIOR HPI (01/11/15): 54 year old left-handed male here for evaluation of orthostatic hypotension, dysautonomia. In June patient had left C1 herpes zoster ophthalmicus, treated with antivirals therapy. In July patient began to have intermittent episodes of hypotension, dizziness, urinary retention and ultimately leading to syncope. Patient was evaluated at the hospital 3 times including most recent admission with discharge on 12/23/2014. During this time he has been diagnosed with prostatitis, mild renal insufficiency, postural orthostatic  tachycardia syndrome. He was tried on Midodrine and Florinef, and ultimately discharged on Florinef plus hydrocortisone. Had problems with urinary sxs with midodrine. Outpatient urology follow-up is pending. For prostatitis he was discharged on anti-biotics to complete a 6 week course. He also has urology follow-up for urinary retention. Patient also set up for follow-up in my neurology clinic today. Patient still having significant problems standing and walking. He also has chronic low back pain, history of spinal stenosis status post surgery. Patient has had back surgeries in 2002 and 2009. He has chronic pain syndrome. Is on long-term narcotic medications.   REVIEW OF SYSTEMS: Full 14 system review of systems performed and notable only for blurred vision passing out (x 2 in last 3 weeks) chest pain.   ALLERGIES: Allergies  Allergen Reactions  . Suboxone [Buprenorphine Hcl-Naloxone Hcl] Itching and Nausea Only    dizziness  . Losartan Other (See Comments)    headaches  . Midodrine Hcl Other (See Comments)    Urinary retention  . Reglan [Metoclopramide] Other (See Comments)    Altered mental status  . Antihistamines, Chlorpheniramine-Type Other (See Comments)    Hallucinations  . Pyridium [Phenazopyridine Hcl] Hives and Other (See Comments)    Dizziness and indigestion    HOME MEDICATIONS: Outpatient Medications Prior to Visit  Medication Sig Dispense Refill  . atorvastatin (LIPITOR) 10 MG tablet 1 TABLET ONCE A DAY ORALLY 30 DAY(S)  5  . Cyanocobalamin (B-12) 5000 MCG CAPS Take by mouth daily.    Mariane Baumgarten Sodium (COLACE PO) Take by mouth.    . gabapentin (NEURONTIN) 300 MG capsule Take 2 capsules 3 times a day. 180 capsule 11  .  lisinopril (ZESTRIL) 40 MG tablet Take 40 mg by mouth daily.    . meloxicam (MOBIC) 15 MG tablet Take 15 mg by mouth daily.  0  . metFORMIN (GLUCOPHAGE) 1000 MG tablet Take 1,000 mg by mouth 2 (two) times daily.  1  . timolol (BETIMOL) 0.25 % ophthalmic  solution Place 1 drop into both eyes daily.    Marland Kitchen aspirin 81 MG tablet Take 81 mg by mouth daily.    Marland Kitchen glimepiride (AMARYL) 2 MG tablet half a tablet once daily  1  . ondansetron (ZOFRAN ODT) 8 MG disintegrating tablet 8mg  ODT q8 hours prn nausea 12 tablet 0  . sildenafil (VIAGRA) 100 MG tablet TAKE 1/2 TO 1 TABLET AS NEEDED 30 MINUTES TO 1 HOUR BEFORE SEXUAL INTERCOURSE  5   No facility-administered medications prior to visit.    PAST MEDICAL HISTORY: Past Medical History:  Diagnosis Date  . Autonomic dysfunction 10/01/2015  . Back pain   . COVID-19 04/2019  . DDD (degenerative disc disease), cervical   . Diabetes mellitus without complication (HCC)    type 2  . Essential hypertension 06/04/2015  . Headache    migraines  . History of shingles   . Hypercholesterolemia   . Hypertension   . Lumbago with sciatica, right side   . Memory changes   . Migraine   . Shingles   . Spinal stenosis     PAST SURGICAL HISTORY: Past Surgical History:  Procedure Laterality Date  . BACK SURGERY  2002,2009   x 2, lower , job injury, spinal stenosis  . WISDOM TOOTH EXTRACTION  2007    FAMILY HISTORY: Family History  Problem Relation Age of Onset  . Heart disease Maternal Grandmother   . Hypertension Maternal Grandmother   . Hyperlipidemia Maternal Grandmother   . Cancer Maternal Grandfather   . Hypertension Maternal Grandfather   . Hypertension Mother   . Cancer Mother     SOCIAL HISTORY:  Social History   Socioeconomic History  . Marital status: Married    Spouse name: Ava  . Number of children: 4  . Years of education: 74  . Highest education level: Not on file  Occupational History    Comment: disabled  Tobacco Use  . Smoking status: Former Smoker    Quit date: 05/02/2007    Years since quitting: 12.3  . Smokeless tobacco: Never Used  Substance and Sexual Activity  . Alcohol use: No    Alcohol/week: 0.0 standard drinks  . Drug use: No  . Sexual activity: Yes  Other  Topics Concern  . Not on file  Social History Narrative   Lives at home with wife, children   Caffeine use- 1 soda a day         Epworth Sleepiness Scale = 4 (as of 03/17/15)   Social Determinants of Health   Financial Resource Strain:   . Difficulty of Paying Living Expenses:   Food Insecurity:   . Worried About 03/19/15 in the Last Year:   . Programme researcher, broadcasting/film/video in the Last Year:   Transportation Needs:   . Barista (Medical):   Freight forwarder Lack of Transportation (Non-Medical):   Physical Activity:   . Days of Exercise per Week:   . Minutes of Exercise per Session:   Stress:   . Feeling of Stress :   Social Connections:   . Frequency of Communication with Friends and Family:   . Frequency of Social Gatherings with Friends  and Family:   . Attends Religious Services:   . Active Member of Clubs or Organizations:   . Attends Banker Meetings:   Marland Kitchen Marital Status:   Intimate Partner Violence:   . Fear of Current or Ex-Partner:   . Emotionally Abused:   Marland Kitchen Physically Abused:   . Sexually Abused:      PHYSICAL EXAM  GENERAL EXAM/CONSTITUTIONAL: Vitals:  Vitals:   09/16/19 0840  BP: 122/79  Pulse: 74  Weight: 272 lb 3.2 oz (123.5 kg)  Height: 5\' 10"  (1.778 m)    Body mass index is 39.06 kg/m. No exam data present  Patient is in no distress; well developed, nourished and groomed; neck is supple   CARDIOVASCULAR:  Examination of carotid arteries is normal; no carotid bruits  Regular rate and rhythm, no murmurs  Examination of peripheral vascular system by observation and palpation is normal  EYES:  Ophthalmoscopic exam of optic discs and posterior segments is normal; no papilledema or hemorrhages  MUSCULOSKELETAL:  Gait, strength, tone, movements noted in Neurologic exam below  NEUROLOGIC: MENTAL STATUS:  No flowsheet data found.  awake, alert, oriented to person, place and time  recent and remote memory intact  normal  attention and concentration  language fluent, comprehension intact, naming intact,   fund of knowledge appropriate  CRANIAL NERVE:   2nd, 3rd, 4th, 6th - pupils equal and reactive to light, visual fields full to confrontation, extraocular muscles intact, no nystagmus  5th - facial sensation symmetric  7th - facial strength symmetric  8th - hearing intact  9th - palate elevates symmetrically, uvula midline  11th - shoulder shrug symmetric  12th - tongue protrusion midline  MOTOR:   normal bulk and tone; BUE 5; BLE 5  SENSORY:   normal and symmetric to light touch, temperature, vibration  COORDINATION:   finger-nose-finger, fine finger movements SLOW  REFLEXES:   deep tendon reflexes TRACE and symmetric; ABSENT AT KNEE AND ANKLES  GAIT/STATION:   narrow based gait    DIAGNOSTIC DATA (LABS, IMAGING, TESTING) - I reviewed patient records, labs, notes, testing and imaging myself where available.  Lab Results  Component Value Date   WBC 6.6 01/05/2016   HGB 15.8 01/05/2016   HCT 44.3 01/05/2016   MCV 96.9 01/05/2016   PLT 170 01/05/2016      Component Value Date/Time   NA 144 06/01/2015 0001   K 3.5 06/01/2015 0001   CL 105 06/01/2015 0001   CO2 29 06/01/2015 0001   GLUCOSE 93 06/01/2015 0001   BUN 17 06/01/2015 0001   BUN 8 02/22/2015 0000   CREATININE 0.96 06/01/2015 0001   CREATININE 0.76 03/22/2015 1028   CALCIUM 9.0 06/01/2015 0001   PROT 8.2 (H) 01/05/2016 0003   ALBUMIN 4.2 01/05/2016 0003   AST 22 01/05/2016 0003   ALT 25 01/05/2016 0003   ALKPHOS 97 01/05/2016 0003   BILITOT 0.7 01/05/2016 0003   GFRNONAA >60 06/01/2015 0001   GFRNONAA >89 03/22/2015 1028   GFRAA >60 06/01/2015 0001   GFRAA >89 03/22/2015 1028   No results found for: CHOL, HDL, LDLCALC, LDLDIRECT, TRIG, CHOLHDL Lab Results  Component Value Date   HGBA1C 4.9 03/22/2015   Lab Results  Component Value Date   VITAMINB12 378 12/22/2014   Lab Results  Component  Value Date   TSH 1.145 12/04/2014    12/17/14 CT head [I reviewed images myself and agree with interpretation. -VRP]  - negative   12/08/14 MRI right hip -  Negative examination. There is no evidence of septic hip. No abnormality to explain the patient's symptoms is identified.   11/30/14 MRI lumbar spine  1. Overall stable appearance of the lumbar spine with sequelae of prior decompressive laminectomy at L4-5. No recurrent stenosis at this level. 2. Congenital spinal stenosis with superimposed mild multilevel degenerative changes as above, overall relatively similar to prior MRI from 2011. No severe canal stenosis or evidence of cord compression. 3. Distended urinary bladder.  02/08/15 MRI brain  - normal  01/27/15 MRI cervical spine (with and without) demonstrating: 1. At C4-5: leftward disc bulging with severe left foraminal stenosis 2. At C5-6: uncovertebral joint hypertrophy with moderate left foraminal stenosis  3. At C6-7: uncovertebral joint hypertrophy and facet hypertrophy with mild right and moderate left foraminal stenosis  4. No intrinsic spinal cord lesions.  01/27/15 MRI thoracic spine (with and without) demonstrating: 1. At T3-4: right uncovertebral joint hypertrophy with moderate right foraminal stenosis  2. At T8-9: disc bulging with no spinal stenosis or foraminal narrowing  3. No intrinsic or compressive spinal cord lesions.   04/22/15 EMG/NCS - Widespread sensorimotor polyneuropathy with axonal and demyelinating features. Active and chronic denervation changes noted on each needle EMG. Given the clinical context, considerations include immune neuropathies (CIDP, anti-sulfatide, anti-MAG, anti-GM1, monoclonal gammopathy), hereditary neuropathies (CMT, HMSN), infectious neuropathies (HIV) or metabolic causes (diabetes, hypothyroidism, liver disease).  05/06/15 CSF (WBC 1, RBC 1, protein 110, glucose 62)      ASSESSMENT AND PLAN  55 y.o. year old male here with  history of lumbar spinal stenosis, status post decompression surgery, chronic pain, now with progressive medical and neurologic decline since June 2016. Patient had left V1 herpes zoster ophthalmicus in June 2016 followed by progressive urinary retention, dizziness and now orthostatic intolerance/hypotension. 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 Some spontaneous improvement, with resolution by 2018.    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)  Migraine with aura and without status migrainosus, not intractable  Insomnia, unspecified type  Anxiety  Memory loss  COVID-19  Post viral syndrome  Diabetes mellitus type II, non insulin dependent (HCC), Chronic    PLAN:   POST COVID HEADACHES, ANXIETY, FATIGUE - check MRI brain  - check sleep study  MIGRAINE PREVENTION  LIFESTYLE CHANGES -Stop or avoid smoking -Decrease or avoid caffeine / alcohol -Eat and sleep on a regular schedule -Exercise several times per week - start amitriptyline 25mg  at bedtime  MIGRAINE RESCUE  - tylenol as needed - rizatriptan (Maxalt) 10mg  as needed for breakthrough headache; may repeat x 1 after 2 hours; max 2 tabs per day or 8 per month    No follow-ups on file.    , MD 09/16/2019, 9:30 AM Certified in Neurology, Neurophysiology and Neuroimaging  [facility] [address], [address] [place], [address] [place] [id number]

## 2019-09-16 NOTE — Telephone Encounter (Signed)
medicare order sent to GI. No auth they will reach out to the patient to schedule.  

## 2019-09-16 NOTE — Patient Instructions (Addendum)
POST COVID HEADACHES, ANXIETY, FATIGUE - check MRI brain  - check sleep study  MIGRAINE PREVENTION  LIFESTYLE CHANGES -Stop or avoid smoking -Decrease or avoid caffeine / alcohol -Eat and sleep on a regular schedule -Exercise several times per week - start amitriptyline 25mg  at bedtime  MIGRAINE RESCUE  - tylenol as needed - rizatriptan (Maxalt) 10mg  as needed for breakthrough headache; may repeat x 1 after 2 hours; max 2 tabs per day or 8 per month

## 2019-09-30 ENCOUNTER — Ambulatory Visit (INDEPENDENT_AMBULATORY_CARE_PROVIDER_SITE_OTHER): Payer: Medicare Other | Admitting: Neurology

## 2019-09-30 ENCOUNTER — Other Ambulatory Visit: Payer: Self-pay

## 2019-09-30 ENCOUNTER — Encounter: Payer: Self-pay | Admitting: Neurology

## 2019-09-30 VITALS — BP 147/88 | HR 80 | Ht 71.0 in | Wt 271.0 lb

## 2019-09-30 DIAGNOSIS — R0683 Snoring: Secondary | ICD-10-CM

## 2019-09-30 DIAGNOSIS — R351 Nocturia: Secondary | ICD-10-CM

## 2019-09-30 DIAGNOSIS — G4719 Other hypersomnia: Secondary | ICD-10-CM

## 2019-09-30 DIAGNOSIS — R519 Headache, unspecified: Secondary | ICD-10-CM | POA: Diagnosis not present

## 2019-09-30 DIAGNOSIS — E669 Obesity, unspecified: Secondary | ICD-10-CM | POA: Diagnosis not present

## 2019-09-30 NOTE — Patient Instructions (Addendum)
Here is what we discussed today and what we came up with as our plan for you:   Please talk to Dr. Marjory Lies about the the amitriptyline and alternative treatment options as you have experienced side effects.   Based on your symptoms and your exam I believe you are at risk for obstructive sleep apnea (aka OSA), and I think we should proceed with a sleep study to determine whether you do or do not have OSA and how severe it is. Even, if you have mild OSA, I may want you to consider treatment with CPAP, as treatment of even borderline or mild sleep apnea can result and improvement of symptoms such as sleep disruption, daytime sleepiness, nighttime bathroom breaks, restless leg symptoms, improvement of headache syndromes, even improved mood disorder.   Please remember, the long-term risks and ramifications of untreated moderate to severe obstructive sleep apnea are: increased Cardiovascular disease, including congestive heart failure, stroke, difficult to control hypertension, treatment resistant obesity, arrhythmias, especially irregular heartbeat commonly known as A. Fib. (atrial fibrillation); even type 2 diabetes has been linked to untreated OSA.   Sleep apnea can cause disruption of sleep and sleep deprivation in most cases, which, in turn, can cause recurrent headaches, problems with memory, mood, concentration, focus, and vigilance. Most people with untreated sleep apnea report excessive daytime sleepiness, which can affect their ability to drive. Please do not drive if you feel sleepy. Patients with sleep apnea developed difficulty initiating and maintaining sleep (aka insomnia).   Having sleep apnea may increase your risk for other sleep disorders, including involuntary behaviors sleep such as sleep terrors, sleep talking, sleepwalking.    Having sleep apnea can also increase your risk for restless leg syndrome and leg movements at night.   Please note that untreated obstructive sleep apnea may  carry additional perioperative morbidity. Patients with significant obstructive sleep apnea (typically, in the moderate to severe degree) should receive, if possible, perioperative PAP (positive airway pressure) therapy and the surgeons and particularly the anesthesiologists should be informed of the diagnosis and the severity of the sleep disordered breathing.   I will likely see you back after your sleep study to go over the test results and where to go from there. We will call you after your sleep study to advise about the results (most likely, you will hear from [facility], my nurse) and to set up an appointment at the time, as necessary.    Our sleep lab administrative assistant will call you to schedule your sleep study and give you further instructions, regarding the check in process for the sleep study, arrival time, what to bring, when you can expect to leave after the study, etc., and to answer any other logistical questions you may have. If you don't hear back from her by about 2 weeks from now, please feel free to call her direct line at [phone] or you can call our general clinic number, or email [place] through My Chart.

## 2019-09-30 NOTE — Progress Notes (Signed)
Subjective:    Patient ID: Jimmy [name] is a 54 y.o. male.  HPI     Huston Foley, MD, PhD [facility] [address] [address] [place], [place] [address]  Dear Jimmy [name],   I saw your patient, Jimmy [name], Upon your kind request in my sleep clinic today for initial consultation of his sleep disorder, in particular, concern for underlying obstructive sleep apnea.  The patient is accompanied by Corrie Dandy, his mother-in-law, today.  As you know, Mr. Marney is a 54 year old right-handed gentleman with an underlying medical history of migraine headaches, memory changes, chronic low back pain with spinal stenosis, status post back surgery twice, hypertension, diabetes, hyperlipidemia, history of COVID-19, and obesity, who reports snoring and excessive daytime somnolence.  I reviewed your office note from 09/16/2019.  His Epworth sleepiness score is 13 out of 24, fatigue severity score is 44 out of 63. He reports that he does not sleep very long, even as a child he did not sleep very much.  His bedtime is around 11, rise time around 530, he typically lays in bed until about 6:30 AM.  He is on disability, he lives with his wife and 2 children, they have 2 cats in the household, he has a TV in the bedroom but does not use it typically.  He has nocturia at least once per average night, does report recurrent and fairly frequent morning headaches.  He was started on amitriptyline about 10 or 12 days ago when he saw you in clinic and reports that he has had side effects including blurry vision, feeling foggy headed and mouth dryness.  He still takes it but is planning to stop it.  He has no family history of sleep apnea, he had a tonsillectomy as a child, he reports recent weight gain.  He drinks caffeine in the form of diet Dr. Reino Kent, about 3 servings per day, no coffee or tea typically.  He tries to hydrate well with water.  He quit smoking some 12 years ago, drinks alcohol  rarely, on special occasions. His Past Medical History Is Significant For: Past Medical History:  Diagnosis Date  . Autonomic dysfunction 10/01/2015  . Back pain   . COVID-19 04/2019  . DDD (degenerative disc disease), cervical   . Diabetes mellitus without complication (HCC)    type 2  . Essential hypertension 06/04/2015  . Headache    migraines  . History of shingles   . Hypercholesterolemia   . Hypertension   . Lumbago with sciatica, right side   . Memory changes   . Migraine   . Shingles   . Spinal stenosis     His Past Surgical History Is Significant For: Past Surgical History:  Procedure Laterality Date  . BACK SURGERY  2002,2009   x 2, lower , job injury, spinal stenosis  . WISDOM TOOTH EXTRACTION  2007    His Family History Is Significant For: Family History  Problem Relation Age of Onset  . Heart disease Maternal Grandmother   . Hypertension Maternal Grandmother   . Hyperlipidemia Maternal Grandmother   . Cancer Maternal Grandfather   . Hypertension Maternal Grandfather   . Hypertension Mother   . Cancer Mother     His Social History Is Significant For: Social History   Socioeconomic History  . Marital status: Married    Spouse name: Ava  . Number of children: 4  . Years of education: 52  . Highest education level: Not on file  Occupational History    Comment: disabled  Tobacco Use  . Smoking status: Former Smoker    Quit date: 05/02/2007    Years since quitting: 12.4  . Smokeless tobacco: Never Used  Substance and Sexual Activity  . Alcohol use: No    Alcohol/week: 0.0 standard drinks  . Drug use: No  . Sexual activity: Yes  Other Topics Concern  . Not on file  Social History Narrative   Lives at home with wife, children   Caffeine use- 1 soda a day         Epworth Sleepiness Scale = 4 (as of 03/17/15)   Social Determinants of Health   Financial Resource Strain:   . Difficulty of Paying Living Expenses:   Food Insecurity:   . Worried  About Programme researcher, broadcasting/film/video in the Last Year:   . Barista in the Last Year:   Transportation Needs:   . Freight forwarder (Medical):   Marland Kitchen Lack of Transportation (Non-Medical):   Physical Activity:   . Days of Exercise per Week:   . Minutes of Exercise per Session:   Stress:   . Feeling of Stress :   Social Connections:   . Frequency of Communication with Friends and Family:   . Frequency of Social Gatherings with Friends and Family:   . Attends Religious Services:   . Active Member of Clubs or Organizations:   . Attends Banker Meetings:   Marland Kitchen Marital Status:     His Allergies Are:  Allergies  Allergen Reactions  . Suboxone [Buprenorphine Hcl-Naloxone Hcl] Itching and Nausea Only    dizziness  . Losartan Other (See Comments)    headaches  . Midodrine Hcl Other (See Comments)    Urinary retention  . Reglan [Metoclopramide] Other (See Comments)    Altered mental status  . Antihistamines, Chlorpheniramine-Type Other (See Comments)    Hallucinations  . Pyridium [Phenazopyridine Hcl] Hives and Other (See Comments)    Dizziness and indigestion  :   His Current Medications Are:  Outpatient Encounter Medications as of 09/30/2019  Medication Sig  . ALPRAZolam (XANAX) 0.5 MG tablet for sedation before MRI scan; take 1 tab 1 hour before scan; may repeat 1 tab 15 min before scan  . amitriptyline (ELAVIL) 25 MG tablet Take 1 tablet (25 mg total) by mouth at bedtime.  Marland Kitchen atorvastatin (LIPITOR) 10 MG tablet 1 TABLET ONCE A DAY ORALLY 30 DAY(S)  . Cyanocobalamin (B-12) 5000 MCG CAPS Take by mouth daily.  Tery Sanfilippo Sodium (COLACE PO) Take by mouth.  . gabapentin (NEURONTIN) 300 MG capsule Take 2 capsules 3 times a day.  . lisinopril (ZESTRIL) 40 MG tablet Take 40 mg by mouth daily.  . meloxicam (MOBIC) 15 MG tablet Take 15 mg by mouth daily.  . metFORMIN (GLUCOPHAGE) 1000 MG tablet Take 1,000 mg by mouth 2 (two) times daily.  . rizatriptan (MAXALT-MLT) 10 MG  disintegrating tablet Take 1 tablet (10 mg total) by mouth as needed for migraine. May repeat in 2 hours if needed  . timolol (BETIMOL) 0.25 % ophthalmic solution Place 1 drop into both eyes daily.   No facility-administered encounter medications on file as of 09/30/2019.  :  Review of Systems:  Out of a complete 14 point review of systems, all are reviewed and negative with the exception of these symptoms as listed below: Review of Systems  Neurological:       Here for sleep consult. No prior sleep study  reports h/a and snoring are present. Epworth Sleepiness Scale 0= would never doze 1= slight chance of dozing 2= moderate chance of dozing 3= high chance of dozing  Sitting and reading:2 Watching TV:2 Sitting inactive in a public place (ex. Theater or meeting):2 As a passenger in a car for an hour without a break:3 Lying down to rest in the afternoon:3 Sitting and talking to someone:0 Sitting quietly after lunch (no alcohol):1 In a car, while stopped in traffic:0 Total:13      Objective:  Neurological Exam  Physical Exam Physical Examination:   Vitals:   09/30/19 0749  BP: (!) 147/88  Pulse: 80   General Examination: The patient is a very pleasant 54 y.o. male in no acute distress. He appears well-developed and well-nourished and well groomed.   HEENT: Normocephalic, atraumatic, pupils are equal, round and reactive to light, extraocular tracking is good without limitation to gaze excursion or nystagmus noted. Hearing is grossly intact. Face is symmetric with normal facial animation. Speech is clear with no dysarthria noted. There is no hypophonia. There is no lip, neck/head, jaw or voice tremor. Neck is supple with full range of passive and active motion. There are no carotid bruits on auscultation. Oropharynx exam reveals: moderate mouth dryness, adequate dental hygiene and moderate airway crowding, due to smaller airway entry, thicker soft palate, wider tongue, larger uvula  noted, Mallampati class II, tonsils absent.  Neck circumference is 18-5/8 inches.  He has a minimal overbite.  Tongue protrudes centrally and palate elevates symmetrically.  Chest: Clear to auscultation without wheezing, rhonchi or crackles noted.  Heart: S1+S2+0, regular and normal without murmurs, rubs or gallops noted.   Abdomen: Soft, non-tender and non-distended with normal bowel sounds appreciated on auscultation.  Extremities: There is no pitting edema in the distal lower extremities bilaterally.   Skin: Warm and dry without trophic changes noted.   Musculoskeletal: exam reveals no obvious joint deformities, tenderness or joint swelling or erythema.   Neurologically:  Mental status: The patient is awake, alert and oriented in all 4 spheres. His immediate and remote memory, attention, language skills and fund of knowledge are appropriate. There is no evidence of aphasia, agnosia, apraxia or anomia. Speech is clear with normal prosody and enunciation. Thought process is linear. Mood is normal and affect is normal.  Cranial nerves II - XII are as described above under HEENT exam.  Motor exam: Normal bulk, strength and tone is noted. There is no tremor, Romberg is negative. Fine motor skills and coordination: grossly intact.  Cerebellar testing: No dysmetria or intention tremor. There is no truncal or gait ataxia.  Sensory exam: intact to light touch in the upper and lower extremities.  Gait, station and balance: He stands easily. No veering to one side is noted. No leaning to one side is noted. Posture is age-appropriate and stance is narrow based. Gait shows normal stride length and normal pace. No problems turning are noted. Tandem walk is challenging for him.                  Assessment and Plan:   In summary, Khyri Hinzman is a very pleasant 54 y.o.-year old male with an underlying medical history of migraine headaches, memory changes, chronic low back pain with spinal stenosis,  status post back surgery twice, hypertension, diabetes, hyperlipidemia, history of COVID-19, and obesity, whose history and physical exam are concerning for obstructive sleep apnea (OSA). I had a long chat with the patient and Corrie Dandy about my findings  and the diagnosis of OSA, its prognosis and treatment options. We talked about medical treatments, surgical interventions and non-pharmacological approaches. I explained in particular the risks and ramifications of untreated moderate to severe OSA, especially with respect to developing cardiovascular disease down the Road, including congestive heart failure, difficult to treat hypertension, cardiac arrhythmias, or stroke. Even type 2 diabetes has, in part, been linked to untreated OSA. Symptoms of untreated OSA include daytime sleepiness, memory problems, mood irritability and mood disorder such as depression and anxiety, lack of energy, as well as recurrent headaches, especially morning headaches. We talked about trying to maintain a healthy lifestyle in general, as well as the importance of weight control. We also talked about the importance of good sleep hygiene. I recommended the following at this time: sleep study.   I explained the sleep test procedure to the patient and also outlined possible surgical and non-surgical treatment options of OSA, including the use of a custom-made dental device (which would require a referral to a specialist dentist or oral surgeon), upper airway surgical options, such as traditional UPPP or a novel less invasive surgical option in the form of Inspire hypoglossal nerve stimulation (which would involve a referral to an ENT surgeon). I also explained the CPAP treatment option to the patient, who indicated that he would be willing to try CPAP or autoPAP if the need arises.  I answered all their questions today and the patient and his mother-in-law were in agreement. I plan to see him back after the sleep study is completed and  encouraged him to call with any interim questions, concerns, problems or updates.  He is also advised to get in touch with your nurse regarding his concerns about amitriptyline.  He is currently taking 25 mg at bedtime.  Thank you very much for allowing me to participate in the care of this nice patient. If I can be of any further assistance to you please do not hesitate to talk to me.   Sincerely,   Star Age, MD, PhD

## 2019-10-02 ENCOUNTER — Telehealth: Payer: Self-pay | Admitting: Neurology

## 2019-10-02 NOTE — Telephone Encounter (Signed)
Phone rep checked office voicemail's at 3:25 pt left message stating the medication called in by Dr Marjory Lies  Has side effects that are not agreeing with him too well.  Please call

## 2019-10-02 NOTE — Telephone Encounter (Signed)
Called patient, LVM requesting call back to discuss.   

## 2019-10-06 NOTE — Telephone Encounter (Signed)
Stop amitriptyline. Monitor symptoms. -VRP

## 2019-10-06 NOTE — Telephone Encounter (Addendum)
Spoke with patient who stated the amitryiptyline makes him extremely drowsy, everything is in a fog, and he has no appetite, "feels awful". He stated he still has daily headaches.I advised will discuss with MD and call him back. Patient verbalized understanding, appreciation.

## 2019-10-07 NOTE — Telephone Encounter (Signed)
Called patient and advised he stop Amitriptyline and monitor his symptoms.  Be sure he is feeling well, take OTC for headaches and call us back in several days with update. Patient verbalized understanding, appreciation.

## 2019-10-09 ENCOUNTER — Other Ambulatory Visit: Payer: Self-pay | Admitting: Diagnostic Neuroimaging

## 2019-10-10 ENCOUNTER — Other Ambulatory Visit: Payer: Self-pay

## 2019-10-10 ENCOUNTER — Ambulatory Visit (INDEPENDENT_AMBULATORY_CARE_PROVIDER_SITE_OTHER): Payer: Medicare Other | Admitting: Neurology

## 2019-10-10 DIAGNOSIS — R0683 Snoring: Secondary | ICD-10-CM

## 2019-10-10 DIAGNOSIS — G4733 Obstructive sleep apnea (adult) (pediatric): Secondary | ICD-10-CM

## 2019-10-10 DIAGNOSIS — E669 Obesity, unspecified: Secondary | ICD-10-CM

## 2019-10-10 DIAGNOSIS — G4720 Circadian rhythm sleep disorder, unspecified type: Secondary | ICD-10-CM

## 2019-10-10 DIAGNOSIS — R519 Headache, unspecified: Secondary | ICD-10-CM

## 2019-10-10 DIAGNOSIS — R351 Nocturia: Secondary | ICD-10-CM

## 2019-10-10 DIAGNOSIS — G4719 Other hypersomnia: Secondary | ICD-10-CM

## 2019-10-18 ENCOUNTER — Other Ambulatory Visit: Payer: Self-pay

## 2019-10-18 ENCOUNTER — Ambulatory Visit
Admission: RE | Admit: 2019-10-18 | Discharge: 2019-10-18 | Disposition: A | Discharge status: Home / Self Care | Payer: Medicare Other | Source: Ambulatory Visit | Attending: Diagnostic Neuroimaging | Admitting: Diagnostic Neuroimaging

## 2019-10-18 DIAGNOSIS — U071 COVID-19: Secondary | ICD-10-CM

## 2019-10-18 DIAGNOSIS — F419 Anxiety disorder, unspecified: Secondary | ICD-10-CM

## 2019-10-18 DIAGNOSIS — R413 Other amnesia: Secondary | ICD-10-CM

## 2019-10-18 MED ORDER — GADOBENATE DIMEGLUMINE 529 MG/ML IV SOLN
20.0000 mL | Freq: Once | INTRAVENOUS | Status: AC | PRN
  Administered 2019-10-18: 20 mL via INTRAVENOUS

## 2019-10-21 ENCOUNTER — Telehealth: Payer: Self-pay | Admitting: *Deleted

## 2019-10-21 NOTE — Telephone Encounter (Signed)
Spoke with patient and informed him that his MRI brain showed unremarkable imaging results. He stated understanding, then stated he has recovered from side effects of amitriptyline. He reported he sometimes will wake with headache but it's not bad. He drinks water and caffeine for relief. He does not want to start another medication at this time, stated he has not had any migraines. I advised he call if he develops any problems. He then asked for sleep study results. I advised will send note to Aundra Millet, Dr Teofilo Pod RN to call him when results are ready. Patient verbalized understanding, appreciation.

## 2019-10-22 ENCOUNTER — Telehealth: Payer: Self-pay

## 2019-10-22 NOTE — Telephone Encounter (Signed)
-----   Message from Huston Foley, MD sent at 10/22/2019  9:11 AM EDT ----- Patient referred by Dr. Marjory Lies, seen by me on 09/30/19, diagnostic PSG on 10/10/19.   Please call and notify the patient that the recent sleep study showed overall mild obstructive sleep apnea, but severe during REM sleep with oxygen drops into the lower 80s and higher 70s, as low as 74%. I recommend treatment in the form of CPAP. This will require a repeat sleep study for proper titration and mask fitting and correct monitoring of the oxygen saturations. Please explain to patient. I have placed an order in the chart. Thanks.  Huston Foley, MD, PhD [facility])

## 2019-10-22 NOTE — Addendum Note (Signed)
Addended by: Huston Foley on: 10/22/2019 09:11 AM   Modules accepted: Orders

## 2019-10-22 NOTE — Telephone Encounter (Signed)
I called pt. I advised pt that Dr. Frances Furbish reviewed their sleep study results and found that mild to severe osa and recommends that pt be treated with a cpap. Dr. Frances Furbish recommends that pt return for a repeat sleep study in order to properly titrate the cpap and ensure a good mask fit. Pt is agreeable to returning for a titration study. I advised pt that our sleep lab will file with pt's insurance and call pt to schedule the sleep study when we hear back from the pt's insurance regarding coverage of this sleep study. Pt verbalized understanding of results. Pt had no questions at this time but was encouraged to call back if questions arise.

## 2019-10-22 NOTE — Procedures (Signed)
PATIENT'S NAME:  Jimmy [name], Jimmy [name] DOB:      14-Jun-1965      MR#:    [id number]     DATE OF RECORDING: 10/10/2019 REFERRING M.D.:  Dr. Joycelyn Schmid  Study Performed:   Baseline Polysomnogram HISTORY: 54 year old man with a history of migraine headaches, memory changes, chronic low back pain with spinal stenosis, status post back surgery twice, hypertension, diabetes, hyperlipidemia, history of COVID-19, and obesity, who reports snoring and excessive daytime somnolence. The patient endorsed the Epworth Sleepiness Scale at 13 points. The patient's weight 271 pounds with a height of 71 (inches), resulting in a BMI of 38. kg/m2.  The patient's neck circumference measured 18.6 inches.  CURRENT MEDICATIONS: Elavil, Lipitor, B12, Colace, Neurontin, Zestril, Mobic, Glucophage, Maxalt, Betimol eye drops   PROCEDURE:  This is a multichannel digital polysomnogram utilizing the Somnostar 11.2 system.  Electrodes and sensors were applied and monitored per AASM Specifications.   EEG, EOG, Chin and Limb EMG, were sampled at 200 Hz.  ECG, Snore and Nasal Pressure, Thermal Airflow, Respiratory Effort, CPAP Flow and Pressure, Oximetry was sampled at 50 Hz. Digital video and audio were recorded.      BASELINE STUDY  Lights Out was at 21:51 and Lights On at 04:52.  Total recording time (TRT) was 421.5 minutes, with a total sleep time (TST) of 368.5 minutes.   The patient's sleep latency was 8 minutes.  REM latency was 84.5 minutes.  The sleep efficiency was 87.4 %.     SLEEP ARCHITECTURE: WASO (Wake after sleep onset) was 35 minutes with minimal to mild sleep fragmentation noted. There were 14 minutes in Stage N1, 255.5 minutes Stage N2, 32 minutes Stage N3 and 67 minutes in Stage REM.  The percentage of Stage N1 was 3.8%, Stage N2 was 69.3%, which is mildly increased, Stage N3 was 8.7% and Stage R (REM sleep) was 18.2%, which is mildly reduced. The arousals were noted as: 49 were spontaneous, 0 were associated with  PLMs, 34 were associated with respiratory events.  RESPIRATORY ANALYSIS:  There were a total of 78 respiratory events:  0 obstructive apneas, 0 central apneas and 0 mixed apneas with a total of 0 apneas and an apnea index (AI) of 0 /hour. There were 78 hypopneas with a hypopnea index of 12.7 /hour. The patient also had 0 respiratory event related arousals (RERAs).      The total APNEA/HYPOPNEA INDEX (AHI) was 12.7/hour and the total RESPIRATORY DISTURBANCE INDEX was  12.7 /hour.  62 events occurred in REM sleep and 32 events in NREM. The REM AHI was  55.5 /hour, versus a non-REM AHI of 3.2. The patient spent 27.5 minutes of total sleep time in the supine position and 341 minutes in non-supine.. The supine AHI was 21.8 versus a non-supine AHI of 12.0.  OXYGEN SATURATION & C02:  The Wake baseline 02 saturation was 93%, with the lowest being 74%. Time spent below 89% saturation equaled 49 minutes.  PERIODIC LIMB MOVEMENTS: The patient had a total of 0 Periodic Limb Movements.  The Periodic Limb Movement (PLM) index was 0 and the PLM Arousal index was 0/hour.  Audio and video analysis did not show any abnormal or unusual movements, behaviors, phonations or vocalizations. The patient took 1 bathroom break. Mild to moderate snoring was noted. The EKG was in keeping with normal sinus rhythm (NSR).  Post-study, the patient indicated that sleep was the same as usual.   IMPRESSION:  1. Obstructive Sleep Apnea (OSA) 2. Dysfunctions associated  with sleep stages or arousal from sleep  RECOMMENDATIONS:  1. This study demonstrates obstructive sleep apnea with an overall AHI in the mild range, but severe REM related OSA with a few severe desaturations noted in REM sleep. He had a total AHI of 12.7/hour, REM AHI of 55.2/hour, supine AHI of 21.8/hour and O2 nadir of 74%. Treatment with positive airway pressure in the form of CPAP is recommended. This will require a full night titration study to optimize therapy.  Other treatment options may include avoidance of supine sleep position along with weight loss, upper airway or jaw surgery in selected patients or the use of an oral appliance in certain patients. ENT evaluation and/or consultation with a maxillofacial surgeon or dentist may be feasible in some instances.  Please note that untreated obstructive sleep apnea may carry additional perioperative morbidity. Patients with significant obstructive sleep apnea should receive perioperative PAP therapy and the surgeons and particularly the anesthesiologist should be informed of the diagnosis and the severity of the sleep disordered breathing. 2. This study shows some sleep fragmentation and mildly abnormal sleep stage percentages; these are nonspecific findings and per se do not signify an intrinsic sleep disorder or a cause for the patient's sleep-related symptoms. Causes include (but are not limited to) the first night effect of the sleep study, circadian rhythm disturbances, medication effect or an underlying mood disorder or medical problem.  3. The patient should be cautioned not to drive, work at heights, or operate dangerous or heavy equipment when tired or sleepy. Review and reiteration of good sleep hygiene measures should be pursued with any patient. 4. The patient will be seen in follow-up in the sleep clinic at [facility] for discussion of the test results, symptom and treatment compliance review, further management strategies, etc. The referring provider will be notified of the test results.  I certify that I have reviewed the entire raw data recording prior to the issuance of this report in accordance with the Standards of Accreditation of the American Academy of Sleep Medicine (AASM)   Star Age, MD, PhD Diplomat, American Board of Neurology and Sleep Medicine (Neurology and Sleep Medicine)

## 2019-10-22 NOTE — Progress Notes (Signed)
Patient referred by Dr. Marjory Lies, seen by me on 09/30/19, diagnostic PSG on 10/10/19.   Please call and notify the patient that the recent sleep study showed overall mild obstructive sleep apnea, but severe during REM sleep with oxygen drops into the lower 80s and higher 70s, as low as 74%. I recommend treatment in the form of CPAP. This will require a repeat sleep study for proper titration and mask fitting and correct monitoring of the oxygen saturations. Please explain to patient. I have placed an order in the chart. Thanks.  Huston Foley, MD, PhD [facility])

## 2019-11-05 ENCOUNTER — Ambulatory Visit (INDEPENDENT_AMBULATORY_CARE_PROVIDER_SITE_OTHER): Payer: Medicare Other | Admitting: Neurology

## 2019-11-05 ENCOUNTER — Other Ambulatory Visit: Payer: Self-pay

## 2019-11-05 DIAGNOSIS — G4719 Other hypersomnia: Secondary | ICD-10-CM | POA: Diagnosis not present

## 2019-11-05 DIAGNOSIS — G4733 Obstructive sleep apnea (adult) (pediatric): Secondary | ICD-10-CM

## 2019-11-05 DIAGNOSIS — G4720 Circadian rhythm sleep disorder, unspecified type: Secondary | ICD-10-CM

## 2019-11-05 DIAGNOSIS — R9431 Abnormal electrocardiogram [ECG] [EKG]: Secondary | ICD-10-CM

## 2019-11-05 DIAGNOSIS — E669 Obesity, unspecified: Secondary | ICD-10-CM

## 2019-11-05 DIAGNOSIS — R351 Nocturia: Secondary | ICD-10-CM

## 2019-11-05 DIAGNOSIS — R519 Headache, unspecified: Secondary | ICD-10-CM

## 2019-11-18 ENCOUNTER — Telehealth: Payer: Self-pay

## 2019-11-18 NOTE — Telephone Encounter (Signed)
I called pt. I advised pt that Dr. Frances Furbish reviewed their sleep study results and found that pt did well during cpap titration study on cpap. Dr. Frances Furbish recommends that pt start cpap therapy at home for treatment. I reviewed PAP compliance expectations with the pt. Pt is agreeable to starting a CPAP. I advised pt that an order will be sent to a DME, [facility], and [facility] will call the pt within about one week after they file with the pt's insurance. [facility] will show the pt how to use the machine, fit for masks, and troubleshoot the CPAP if needed. A follow up appt was made for insurance purposes with Dr. Frances Furbish on 01/28/2020. Pt verbalized understanding to arrive 15 minutes early and bring their CPAP. A letter with all of this information in it will be mailed to the pt as a reminder. I verified with the pt that the address we have on file is correct. Pt verbalized understanding of results. Pt had no questions at this time but was encouraged to call back if questions arise. I have sent the order to [facility] and have received confirmation that they have received the order.

## 2019-11-18 NOTE — Addendum Note (Signed)
Addended by: Huston Foley on: 11/18/2019 08:02 AM   Modules accepted: Orders

## 2019-11-18 NOTE — Progress Notes (Signed)
Patient referred by Dr. Marjory Lies, seen by me on 09/30/19, diagnostic PSG on 10/10/19.  Patient had a CPAP titration study on 11/05/19.  Please call and inform patient that I have entered an order for treatment with positive airway pressure (PAP) treatment for obstructive sleep apnea (OSA). He did well during the latest sleep study with CPAP. We will, therefore, arrange for a machine for home use through a DME (durable medical equipment) company of His choice; and I will see the patient back in follow-up in about 10 weeks. Please also explain to the patient that I will be looking out for compliance data, which can be downloaded from the machine (stored on an SD card, that is inserted in the machine) or via remote access through a modem, that is built into the machine. At the time of the followup appointment we will discuss sleep study results and how it is going with PAP treatment at home. Please advise patient to bring His machine at the time of the first FU visit, even though this is cumbersome. Bringing the machine for every visit after that will likely not be needed, but often helps for the first visit to troubleshoot if needed. Please re-enforce the importance of compliance with treatment and the need for [place] to monitor compliance data - often an insurance requirement and actually good feedback for the patient as far as how they are doing.  Also remind patient, that any interim PAP machine or mask issues should be first addressed with the DME company, as they can often help better with technical and mask fit issues. Please ask if patient has a preference regarding DME company.  Please also make sure, the patient has a follow-up appointment with me in about 10 weeks from the setup date, thanks. May see one of our nurse practitioners if needed for proper timing of the FU appointment.  Please fax or rout report to the referring provider. Thanks,   Huston Foley, MD, PhD [facility])

## 2019-11-18 NOTE — Procedures (Signed)
PATIENT'S NAME:  Jimmy, [name] DOB:      07/26/1965      MR#:    [id number]     DATE OF RECORDING: 11/05/2019 REFERRING M.D.:  Dibas Docia Chuck, MD Study Performed:   CPAP  Titration HISTORY: 54 year old man with a history of migraine headaches, memory changes, chronic low back pain with spinal stenosis, status post back surgery twice, hypertension, diabetes, hyperlipidemia, history of COVID-19, and obesity, who reports snoring and excessive daytime somnolence. His baseline sleep study from 10/10/19 showed a total AHI of 12.7/hour, REM AHI of 55.2/hour, supine AHI of 21.8/hour and O2 nadir of 74%. The patient endorsed the Epworth Sleepiness Scale at 13 points. The patient's weight 271 pounds with a height of 71 (inches), resulting in a BMI of 38. kg/m2. The patient's neck circumference measured 18.6 inches.  CURRENT MEDICATIONS: Elavil, Lipitor, B12, Colace, Neurontin, Zestril, Mobic, Glucophage, Maxalt, Betimol eye drops  PROCEDURE:  This is a multichannel digital polysomnogram utilizing the SomnoStar 11.2 system.  Electrodes and sensors were applied and monitored per AASM Specifications.   EEG, EOG, Chin and Limb EMG, were sampled at 200 Hz.  ECG, Snore and Nasal Pressure, Thermal Airflow, Respiratory Effort, CPAP Flow and Pressure, Oximetry was sampled at 50 Hz. Digital video and audio were recorded.      The patient was fitted with medium P10 nasal pillows. CPAP was initiated at 5 cmH20 with heated humidity per AASM standards and pressure was advanced to 13 cm H20 because of hypopneas, apneas and desaturations.  At a PAP pressure of 13 cmH20, there was a reduction of the AHI to 0/hour, with supine REM sleep achieved and O2 nadir of 90%.   Lights Out was at 22:22 and Lights On at 04:54. Total recording time (TRT) was 392.5 minutes, with a total sleep time (TST) of 335 minutes. The patient's sleep latency was 2 minutes. REM latency was 57 minutes, which is mildly reduced. The sleep efficiency was 85.4  %.    SLEEP ARCHITECTURE: WASO (Wake after sleep onset) was 55.5 minutes with one longer period of wakefulness and otherwise mild sleep fragmentation noted. There were 12.5 minutes in Stage N1, 210 minutes Stage N2, 18 minutes Stage N3 and 94.5 minutes in Stage REM.  The percentage of Stage N1 was 3.7%, Stage N2 was 62.7%, which is mildly increased, Stage N3 was 5.4% and Stage R (REM sleep) was 28.2%, which is increased and in keeping with rebound. The arousals were noted as: 25 were spontaneous, 0 were associated with PLMs, 6 were associated with respiratory events.  RESPIRATORY ANALYSIS:  There was a total of 7 respiratory events: 0 obstructive apneas, 0 central apneas and 0 mixed apneas with a total of 0 apneas and an apnea index (AI) of 0 /hour. There were 7 hypopneas with a hypopnea index of 1.3/hour. The patient also had 7 respiratory event related arousals (RERAs).      The total APNEA/HYPOPNEA INDEX  (AHI) was 1.3 /hour and the total RESPIRATORY DISTURBANCE INDEX was 2.5 /hour  5 events occurred in REM sleep and 2 events in NREM. The REM AHI was 3.2 /hour versus a non-REM AHI of .5 /hour.  The patient spent 150.5 minutes of total sleep time in the supine position and 185 minutes in non-supine. The supine AHI was 2.4, versus a non-supine AHI of 0.3.  OXYGEN SATURATION & C02:  The baseline 02 saturation was 94%, with the lowest being 87%. Time spent below 89% saturation equaled 0 minutes.  PERIODIC LIMB  MOVEMENTS:  The patient had a total of 0 Periodic Limb Movements. The Periodic Limb Movement (PLM) index was 0 and the PLM Arousal index was 0 /hour.  Audio and video analysis did not show any abnormal or unusual movements, behaviors, phonations or vocalizations. The patient took one bathroom break. The EKG was in keeping with normal sinus rhythm (NSR) with rare PVCs noted. Post-study, the patient indicated that sleep was the same as usual.   IMPRESSION: 1. Obstructive Sleep Apnea  (OSA) 2. Dysfunctions associated with sleep stages or arousal from sleep 3. Non-specific abnormal EKG   RECOMMENDATIONS: 1. This study demonstrates resolution of the patient's obstructive sleep apnea with CPAP therapy. I will, therefore, start the patient on home CPAP treatment at a pressure of 13 cm via medium nasal pillows with (heated) humidity. The patient will be advised to be fully compliant with PAP therapy to improve sleep related symptoms and decrease long term cardiovascular risks. The patient should be reminded, that it may take up to 3 months to get fully used to using PAP with all planned sleep. The earlier full compliance is achieved, the better long term compliance tends to be. Please note that untreated obstructive sleep apnea may carry additional perioperative morbidity. Patients with significant obstructive sleep apnea should receive perioperative PAP therapy and the surgeons and particularly the anesthesiologist should be informed of the diagnosis and the severity of the sleep disordered breathing. 2. The study showed rare PVCs on single lead EKG; clinical correlation is recommended and consultation with cardiology may be feasible. The patient will be advised to discuss this with his PCP.  3. This study shows some sleep fragmentation and mildly abnormal sleep stage percentages; these are nonspecific findings and per se do not signify an intrinsic sleep disorder or a cause for the patient's sleep-related symptoms. Causes include (but are not limited to) the first night effect of the sleep study, circadian rhythm disturbances, medication effect or an underlying mood disorder or medical problem.  4. The patient should be cautioned not to drive, work at heights, or operate dangerous or heavy equipment when tired or sleepy. Review and reiteration of good sleep hygiene measures should be pursued with any patient. 5. The patient will be seen in follow-up in the sleep clinic at [facility] for discussion  of the test results, symptom and treatment compliance review, further management strategies, etc. The referring provider will be notified of the test results.   I certify that I have reviewed the entire raw data recording prior to the issuance of this report in accordance with the Standards of Accreditation of the American Academy of Sleep Medicine (AASM)   Huston Foley, MD, PhD Diplomat, American Board of Neurology and Sleep Medicine (Neurology and Sleep Medicine)

## 2019-11-18 NOTE — Telephone Encounter (Signed)
-----   Message from Huston Foley, MD sent at 11/18/2019  8:02 AM EDT ----- Patient referred by Dr. Marjory Lies, seen by me on 09/30/19, diagnostic PSG on 10/10/19.  Patient had a CPAP titration study on 11/05/19.  Please call and inform patient that I have entered an order for treatment with positive airway pressure (PAP) treatment for obstructive sleep apnea (OSA). He did well during the latest sleep study with CPAP. We will, therefore, arrange for a machine for home use through a DME (durable medical equipment) company of His choice; and I will see the patient back in follow-up in about 10 weeks. Please also explain to the patient that I will be looking out for compliance data, which can be downloaded from the machine (stored on an SD card, that is inserted in the machine) or via remote access through a modem, that is built into the machine. At the time of the followup appointment we will discuss sleep study results and how it is going with PAP treatment at home. Please advise patient to bring His machine at the time of the first FU visit, even though this is cumbersome. Bringing the machine for every visit after that will likely not be needed, but often helps for the first visit to troubleshoot if needed. Please re-enforce the importance of compliance with treatment and the need for [place] to monitor compliance data - often an insurance requirement and actually good feedback for the patient as far as how they are doing.  Also remind patient, that any interim PAP machine or mask issues should be first addressed with the DME company, as they can often help better with technical and mask fit issues. Please ask if patient has a preference regarding DME company.  Please also make sure, the patient has a follow-up appointment with me in about 10 weeks from the setup date, thanks. May see one of our nurse practitioners if needed for proper timing of the FU appointment.  Please fax or rout report to the referring provider.  Thanks,   Huston Foley, MD, PhD [facility])

## 2020-01-27 DIAGNOSIS — H16223 Keratoconjunctivitis sicca, not specified as Sjogren's, bilateral: Secondary | ICD-10-CM | POA: Diagnosis not present

## 2020-01-27 DIAGNOSIS — H40023 Open angle with borderline findings, high risk, bilateral: Secondary | ICD-10-CM | POA: Diagnosis not present

## 2020-01-27 DIAGNOSIS — H52203 Unspecified astigmatism, bilateral: Secondary | ICD-10-CM | POA: Diagnosis not present

## 2020-01-27 DIAGNOSIS — H524 Presbyopia: Secondary | ICD-10-CM | POA: Diagnosis not present

## 2020-01-27 DIAGNOSIS — H0102A Squamous blepharitis right eye, upper and lower eyelids: Secondary | ICD-10-CM | POA: Diagnosis not present

## 2020-01-27 DIAGNOSIS — H0102B Squamous blepharitis left eye, upper and lower eyelids: Secondary | ICD-10-CM | POA: Diagnosis not present

## 2020-01-27 DIAGNOSIS — H5203 Hypermetropia, bilateral: Secondary | ICD-10-CM | POA: Diagnosis not present

## 2020-01-28 ENCOUNTER — Ambulatory Visit: Payer: Self-pay | Admitting: Neurology

## 2020-02-26 DIAGNOSIS — H0102A Squamous blepharitis right eye, upper and lower eyelids: Secondary | ICD-10-CM | POA: Diagnosis not present

## 2020-02-26 DIAGNOSIS — H40023 Open angle with borderline findings, high risk, bilateral: Secondary | ICD-10-CM | POA: Diagnosis not present

## 2020-02-26 DIAGNOSIS — H16223 Keratoconjunctivitis sicca, not specified as Sjogren's, bilateral: Secondary | ICD-10-CM | POA: Diagnosis not present

## 2020-02-26 DIAGNOSIS — H0102B Squamous blepharitis left eye, upper and lower eyelids: Secondary | ICD-10-CM | POA: Diagnosis not present

## 2020-02-27 DIAGNOSIS — Z125 Encounter for screening for malignant neoplasm of prostate: Secondary | ICD-10-CM | POA: Diagnosis not present

## 2020-02-27 DIAGNOSIS — Z0001 Encounter for general adult medical examination with abnormal findings: Secondary | ICD-10-CM | POA: Diagnosis not present

## 2020-02-27 DIAGNOSIS — E7800 Pure hypercholesterolemia, unspecified: Secondary | ICD-10-CM | POA: Diagnosis not present

## 2020-02-27 DIAGNOSIS — M5441 Lumbago with sciatica, right side: Secondary | ICD-10-CM | POA: Diagnosis not present

## 2020-02-27 DIAGNOSIS — Z79899 Other long term (current) drug therapy: Secondary | ICD-10-CM | POA: Diagnosis not present

## 2020-02-27 DIAGNOSIS — Z23 Encounter for immunization: Secondary | ICD-10-CM | POA: Diagnosis not present

## 2020-02-27 DIAGNOSIS — M5442 Lumbago with sciatica, left side: Secondary | ICD-10-CM | POA: Diagnosis not present

## 2020-02-27 DIAGNOSIS — G8929 Other chronic pain: Secondary | ICD-10-CM | POA: Diagnosis not present

## 2020-02-27 DIAGNOSIS — R413 Other amnesia: Secondary | ICD-10-CM | POA: Diagnosis not present

## 2020-02-27 DIAGNOSIS — E1169 Type 2 diabetes mellitus with other specified complication: Secondary | ICD-10-CM | POA: Diagnosis not present

## 2020-02-27 DIAGNOSIS — I10 Essential (primary) hypertension: Secondary | ICD-10-CM | POA: Diagnosis not present

## 2020-03-04 ENCOUNTER — Ambulatory Visit (INDEPENDENT_AMBULATORY_CARE_PROVIDER_SITE_OTHER): Payer: Medicare Other | Admitting: Neurology

## 2020-03-04 ENCOUNTER — Encounter: Payer: Self-pay | Admitting: Neurology

## 2020-03-04 ENCOUNTER — Other Ambulatory Visit: Payer: Self-pay

## 2020-03-04 VITALS — BP 136/75 | HR 78 | Ht 71.0 in | Wt 270.1 lb

## 2020-03-04 DIAGNOSIS — Z789 Other specified health status: Secondary | ICD-10-CM

## 2020-03-04 DIAGNOSIS — Z9989 Dependence on other enabling machines and devices: Secondary | ICD-10-CM

## 2020-03-04 DIAGNOSIS — G4733 Obstructive sleep apnea (adult) (pediatric): Secondary | ICD-10-CM | POA: Diagnosis not present

## 2020-03-04 NOTE — Progress Notes (Signed)
Sent community message re: new CPAP orders.

## 2020-03-04 NOTE — Progress Notes (Signed)
Subjective:    Patient ID: Jimmy [name] is a 54 y.o. male.  HPI     Interim history:  Mr. Jimmy [name] is a 54 year old right-handed gentleman with an underlying medical history of migraine headaches, memory changes, chronic low back pain with spinal stenosis, status post back surgery twice, hypertension, diabetes, hyperlipidemia, history of COVID-19, and obesity, who Presents for follow-up consultation of his obstructive sleep apnea after testing and starting CPAP therapy.  The patient is unaccompanied today.  I first met him at the request of Dr. Leta [name] on 09/30/2019, at which time he reported snoring and daytime somnolence.  He was advised to proceed with sleep study testing.  He had a baseline sleep study, followed by a CPAP titration study.  His baseline sleep study from 10/10/2019 showed a sleep latency of 8 minutes, REM latency 84.5 minutes, sleep efficiency 87.4%.  Total AHI was 12.7, REM AHI in the severe range at 55.5/h, supine AHI 21.8/h.  Average oxygen saturation 93%, nadir 74%.  Time below 89% saturation was 49 minutes for the night.  He had no significant PLM's, EKG or EEG changes.  He was advised to return for a full night titration study.  He had this on 11/05/2019.  Sleep efficiency was 85.4%, sleep latency 2 minutes, REM latency 57 minutes.  He had a mildly increased percentage of REM sleep at 28.2.  He was fitted with a medium nasal pillows and CPAP was titrated from 5 cm to 13 cm.  On the final pressure, his AHI was 0/h with supine REM sleep achieved and O2 nadir of 90%.  Based on his test results I prescribed CPAP therapy for home use.   Today, 03/04/20: I reviewed his CPAP compliance data from 02/02/2020 through 03/02/2020, which is a total of 30 days, during which time he used his machine 20 days with percent use days greater than 4 hours at 50%, indicating suboptimal compliance with an average usage of 4 hours and 22 minutes, residual AHI of 0.2/h, leak on the high side consistently with  a 95th percentile at 54.4 L/min on a pressure of 13 cm with EPR of 3.  His compliance with better in the month of September, from 01/02/2020 through 01/31/2020, percent use days greater than 4 hours with 63%.  He has been on treatment approximately 77 days.  He reports that it took him a while to get adjusted to treatment.  He is not quite feeling any significant results yet.  He is motivated to continue with treatment.  He has at times taken off the mask in the middle of the night and not realize it.  The patient's allergies, current medications, family history, past medical history, past social history, past surgical history and problem list were reviewed and updated as appropriate.   Previously:   09/30/19: (He) reports snoring and excessive daytime somnolence.  I reviewed your office note from 09/16/2019.  His Epworth sleepiness score is 13 out of 24, fatigue severity score is 44 out of 63. He reports that he does not sleep very long, even as a child he did not sleep very much.  His bedtime is around 11, rise time around 530, he typically lays in bed until about 6:30 AM.  He is on disability, he lives with his wife and 2 children, they have 2 cats in the household, he has a TV in the bedroom but does not use it typically.  He has nocturia at least once per average night, does report recurrent and fairly  frequent morning headaches.  He was started on amitriptyline about 10 or 12 days ago when he saw you in clinic and reports that he has had side effects including blurry vision, feeling foggy headed and mouth dryness.  He still takes it but is planning to stop it.  He has no family history of sleep apnea, he had a tonsillectomy as a child, he reports recent weight gain.  He drinks caffeine in the form of diet Dr. Malachi Bonds, about 3 servings per day, no coffee or tea typically.  He tries to hydrate well with water.  He quit smoking some 12 years ago, drinks alcohol rarely, on special occasions.   His Past Medical  History Is Significant For: Past Medical History:  Diagnosis Date  . Autonomic dysfunction 10/01/2015  . Back pain   . COVID-19 04/2019  . DDD (degenerative disc disease), cervical   . Diabetes mellitus without complication ([place])    type 2  . Essential hypertension 06/04/2015  . Headache    migraines  . History of shingles   . Hypercholesterolemia   . Hypertension   . Lumbago with sciatica, right side   . Memory changes   . Migraine   . Shingles   . Spinal stenosis     His Past Surgical History Is Significant For: Past Surgical History:  Procedure Laterality Date  . BACK SURGERY  2002,2009   x 2, lower , job injury, spinal stenosis  . [place] EXTRACTION  2007    His Family History Is Significant For: Family History  Problem Relation Age of Onset  . Heart disease Maternal Grandmother   . Hypertension Maternal Grandmother   . Hyperlipidemia Maternal Grandmother   . Cancer Maternal Grandfather   . Hypertension Maternal Grandfather   . Hypertension Mother   . Cancer Mother     His Social History Is Significant For: Social History   Socioeconomic History  . Marital status: Married    Spouse name: Ava  . Number of children: 4  . Years of education: 33  . Highest education level: Not on file  Occupational History    Comment: disabled  Tobacco Use  . Smoking status: Former Smoker    Quit date: 05/02/2007    Years since quitting: 12.8  . Smokeless tobacco: Never Used  Substance and Sexual Activity  . Alcohol use: No    Alcohol/week: 0.0 standard drinks  . Drug use: No  . Sexual activity: Yes  Other Topics Concern  . Not on file  Social History Narrative   Lives at home with wife, children   Caffeine use- 1 soda a day         Epworth Sleepiness Scale = 4 (as of 03/17/15)   Social Determinants of Health   Financial Resource Strain:   . Difficulty of Paying Living Expenses: Not on file  Food Insecurity:   . Worried About Charity fundraiser in the Last  Year: Not on file  . Ran Out of Food in the Last Year: Not on file  Transportation Needs:   . Lack of Transportation (Medical): Not on file  . Lack of Transportation (Non-Medical): Not on file  Physical Activity:   . Days of Exercise per Week: Not on file  . Minutes of Exercise per Session: Not on file  Stress:   . Feeling of Stress : Not on file  Social Connections:   . Frequency of Communication with Friends and Family: Not on file  . Frequency of  Social Gatherings with Friends and Family: Not on file  . Attends Religious Services: Not on file  . Active Member of Clubs or Organizations: Not on file  . Attends Archivist Meetings: Not on file  . Marital Status: Not on file    His Allergies Are:  Allergies  Allergen Reactions  . Suboxone [Buprenorphine Hcl-Naloxone Hcl] Itching and Nausea Only    dizziness  . Losartan Other (See Comments)    headaches  . Midodrine Hcl Other (See Comments)    Urinary retention  . Reglan [Metoclopramide] Other (See Comments)    Altered mental status  . Antihistamines, Chlorpheniramine-Type Other (See Comments)    Hallucinations  . Pyridium [Phenazopyridine Hcl] Hives and Other (See Comments)    Dizziness and indigestion  :   His Current Medications Are:  Outpatient Encounter Medications as of 03/04/2020  Medication Sig  . ALPRAZolam (XANAX) 0.5 MG tablet for sedation before MRI scan; take 1 tab 1 hour before scan; may repeat 1 tab 15 min before scan  . atorvastatin (LIPITOR) 10 MG tablet 1 TABLET ONCE A DAY ORALLY 30 DAY(S)  . Cyanocobalamin (B-12) 5000 MCG CAPS Take by mouth daily.  Mariane Baumgarten Sodium (COLACE PO) Take by mouth.  . gabapentin (NEURONTIN) 300 MG capsule Take 2 capsules 3 times a day.  . lisinopril (ZESTRIL) 40 MG tablet Take 40 mg by mouth daily.  . meloxicam (MOBIC) 15 MG tablet Take 15 mg by mouth daily.  . metFORMIN (GLUCOPHAGE) 1000 MG tablet Take 1,000 mg by mouth 2 (two) times daily.  . rizatriptan  (MAXALT-MLT) 10 MG disintegrating tablet Take 1 tablet (10 mg total) by mouth as needed for migraine. May repeat in 2 hours if needed  . timolol (BETIMOL) 0.25 % ophthalmic solution Place 1 drop into both eyes daily.  Marland Kitchen amitriptyline (ELAVIL) 25 MG tablet Take 1 tablet (25 mg total) by mouth at bedtime. (Patient not taking: Reported on 03/04/2020)   No facility-administered encounter medications on file as of 03/04/2020.  :  Review of Systems:  Out of a complete 14 point review of systems, all are reviewed and negative with the exception of these symptoms as listed below: Review of Systems  Neurological:       Patient here alone for intial CPAP FU, no issues with machine but he doesn't think he is sleeping any better.   Epworth Sleepiness Scale 0= would never doze 1= slight chance of dozing 2= moderate chance of dozing 3= high chance of dozing  Sitting and reading:1 Watching TV:1 Sitting inactive in a public place (ex. Theater or meeting):0 As a passenger in a car for an hour without a break:2 Lying down to rest in the afternoon:2 Sitting and talking to someone:0 Sitting quietly after lunch (no alcohol):0 In a car, while stopped in traffic:0 Total:6    Objective:  Neurological Exam  Physical Exam Physical Examination:   Vitals:   03/04/20 0906  BP: 136/75  Pulse: 78    General Examination: The patient is a very pleasant 54 y.o. male in no acute distress. He appears well-developed and well-nourished and well groomed.   HEENT: Normocephalic, atraumatic, pupils are equal, round and reactive to light, extraocular tracking is well preserved, no obvious nystagmus, hearing grossly intact, face is symmetric with normal facial animation, speech is clear without dysarthria, hypophonia or voice tremor. Oropharynx exam reveals: Mild to moderate mouth dryness, adequate dental hygiene and moderate airway crowding. Tongue protrudes centrally and palate elevates symmetrically.  Chest:  Clear to auscultation without wheezing, rhonchi or crackles noted.  Heart: S1+S2+0, regular and normal without murmurs, rubs or gallops noted.   Abdomen: Soft, non-tender and non-distended.  Extremities: There is no  obvious edema around the ankles.    Skin: Warm and dry without trophic changes noted.   Musculoskeletal: exam reveals no obvious joint deformities, tenderness or joint swelling or erythema.   Neurologically:  Mental status: The patient is awake, alert and oriented in all 4 spheres. His immediate and remote memory, attention, language skills and fund of knowledge are appropriate. There is no evidence of aphasia, agnosia, apraxia or anomia. Speech is clear with normal prosody and enunciation. Thought process is linear. Mood is normal and affect is normal.  Cranial nerves II - XII are as described above under HEENT exam.  Motor exam: Normal bulk, strength and tone is noted. There is no tremor, Fine motor skills and coordination: grossly intact.  Cerebellar testing: No dysmetria or intention tremor. There is no truncal or gait ataxia.  Sensory exam: intact to light touch.  Gait, station and balance: He stands easily. No veering to one side is noted. No leaning to one side is noted. Posture is age-appropriate and stance is narrow based. Gait shows normal stride length and normal pace. No problems turning are noted.  Assessment and Plan:   In summary, Topher Buenaventura is a very pleasant 54 year old male with an underlying medical history of migraine headaches, memory changes, chronic low back pain with spinal stenosis, status post back surgery twice, hypertension, diabetes, hyperlipidemia, history of COVID-19, and obesity, who presents for follow-up consultation of his obstructive sleep apnea after interim testing and starting CPAP therapy.  His baseline sleep study from 10/10/2019 showed overall mild sleep apnea but severe REM related sleep apnea with a REM AHI of 55.5/h, and  significant desaturations during REM sleep with a nadir of 74%.  He had a CPAP titration study on 11/05/2019 and did reasonably well on CPAP therapy.  He has established CPAP treatment at home since mid August, pressure is 13 cm.  He is struggling with mask fit and discomfort.  He is also having issues with mouth and nose dryness, leak is consistently quite high.  He uses nasal pillows and I am suspecting that his mouth opens and he may benefit from exploring other mask options especially a full facemask option such as a hybrid style which starts under the nose and covers the lips.  He is advised to continue to use his machine regularly, not to skip any nights and try to be persistent and using it.  The longer and the more he uses it the better he may adjust to it and he may reap better benefit from it.  He is currently not noticing any benefit from treatment but is motivated to continue with it.  He is advised to set up a mask fit appointment with his DME company.  He is advised to increase his humidity in the machine for now and advised to follow-up to see one of our nurse practitioners in 3 months, sooner if needed.  We went over his test results in detail and also his compliance data together.  I answered all his questions today and he was in agreement.   I spent 30 minutes in total face-to-face time and in reviewing records during pre-charting, more than 50% of which was spent in counseling and coordination of care, reviewing test results, reviewing medications and treatment regimen and/or in discussing or reviewing  the diagnosis of OSA, the prognosis and treatment options. Pertinent laboratory and imaging test results that were available during this visit with the patient were reviewed by me and considered in my medical decision making (see chart for details).

## 2020-03-04 NOTE — Progress Notes (Deleted)
Subjective:    Patient ID: Jimmy [name] is a 54 y.o. male.  HPI {Common ambulatory SmartLinks:[id number]}  Review of Systems  Objective:  Neurological Exam  Physical Exam  Assessment:   ***  Plan:   ***

## 2020-03-04 NOTE — Patient Instructions (Addendum)
It was good to see you again today.    Please continue using your CPAP regularly. While your insurance requires that you use CPAP at least 4 hours each night on 70% of the nights, I recommend, that you not skip any nights and use it throughout the night if you can. Getting used to CPAP and staying with the treatment long term does take time and patience and discipline. Untreated obstructive sleep apnea when it is moderate to severe can have an adverse impact on cardiovascular health and raise her risk for heart disease, arrhythmias, hypertension, congestive heart failure, stroke and diabetes. Untreated obstructive sleep apnea causes sleep disruption, nonrestorative sleep, and sleep deprivation. This can have an impact on your day to day functioning and cause daytime sleepiness and impairment of cognitive function, memory loss, mood disturbance, and problems focussing. Using CPAP regularly can improve these symptoms.  You have done a good job thus far with using your CPAP.  You are not quite to the point where you fulfill insurance criteria for compliance.  Please continue to work on using the CPAP every night, all night.  Please try not to skip any nights and put it back on when you realize that you have pulled it off in the middle of the night.  Also, for mouth and nose dryness, try to increase the humidity setting on your machine.  We will also request a mask refit through your DME company, [employer] because your mask is currently leaking and you may need a different style or size.  If they do not contact you by early next week, please call them to set up a refit appointment.   Please follow-up to see one of our nurse practitioners in 3 months, sooner if needed.  Please call us with any interim questions or concerns.

## 2020-03-19 DIAGNOSIS — Z23 Encounter for immunization: Secondary | ICD-10-CM | POA: Diagnosis not present

## 2020-03-23 ENCOUNTER — Ambulatory Visit: Payer: Medicare Other | Admitting: Diagnostic Neuroimaging

## 2020-04-14 ENCOUNTER — Telehealth: Payer: Self-pay | Admitting: *Deleted

## 2020-04-14 ENCOUNTER — Encounter: Payer: Self-pay | Admitting: Diagnostic Neuroimaging

## 2020-04-14 ENCOUNTER — Ambulatory Visit (INDEPENDENT_AMBULATORY_CARE_PROVIDER_SITE_OTHER): Payer: Medicare Other | Admitting: Diagnostic Neuroimaging

## 2020-04-14 ENCOUNTER — Other Ambulatory Visit: Payer: Self-pay

## 2020-04-14 VITALS — BP 136/78 | HR 84 | Ht 71.0 in | Wt 271.6 lb

## 2020-04-14 DIAGNOSIS — G4700 Insomnia, unspecified: Secondary | ICD-10-CM | POA: Diagnosis not present

## 2020-04-14 DIAGNOSIS — Z9989 Dependence on other enabling machines and devices: Secondary | ICD-10-CM

## 2020-04-14 DIAGNOSIS — G4733 Obstructive sleep apnea (adult) (pediatric): Secondary | ICD-10-CM | POA: Diagnosis not present

## 2020-04-14 DIAGNOSIS — R413 Other amnesia: Secondary | ICD-10-CM

## 2020-04-14 DIAGNOSIS — G43109 Migraine with aura, not intractable, without status migrainosus: Secondary | ICD-10-CM | POA: Diagnosis not present

## 2020-04-14 MED ORDER — TOPIRAMATE 50 MG PO TABS
50.0000 mg | ORAL_TABLET | Freq: Two times a day (BID) | ORAL | 12 refills | Status: DC
Start: 2020-04-14 — End: 2021-04-14

## 2020-04-14 MED ORDER — UBRELVY 50 MG PO TABS
50.0000 mg | ORAL_TABLET | ORAL | 6 refills | Status: DC | PRN
Start: 2020-04-14 — End: 2020-09-14

## 2020-04-14 NOTE — Telephone Encounter (Signed)
Bernita Raisin PA for [facility] Tracks completed, on MD desk for review, signature.

## 2020-04-14 NOTE — Progress Notes (Signed)
[facility]  PATIENT: Jimmy [name] DOB: June 26, 1965  REFERRING CLINICIAN: Daub, S HISTORY FROM: patient and wife  REASON FOR VISIT: follow up    HISTORICAL  CHIEF COMPLAINT:  Chief Complaint  Patient presents with  . Migraine    Rm 6, 6 month FU, wife- [name] "stopped amitriptyline-- too sleepy; rizatriptan was ineffective; get a migraine maybe 2-3 x month-take Tylenol, caffeine, lay down in the dark"    HISTORY OF PRESENT ILLNESS:   UPDATE (04/14/20, VRP): Since last visit, continues with headaches, memory loss continue. Anxiety and SOB better. Now on CPAP for OSA. Tried amitrip and rizatriptan without relief.  2-3 migraine per month; low level HA on daily basis. Avg 6 hrs sleep per night. Now started exercise program.   UPDATE (09/16/19, VRP): Since last visit, dysautonomia symptoms resolved in ~2018. BP and bladder issues resolved. Now in Dec 2020 had COVID infx x 3 wks; now post-infx HA, memory and anxiety issues.   UPDATE 07/12/15: Since last visit, now off droxidopa. BP more stable (98/57 - 149/82). Now able to walk without walker for short distances. Unfortunately, IVIG not covered in outpatient infusion center. Numbness / pain stable, but slightly controlled with gabapentin. Lightheadedness still worse in evening.   UPDATE 03/08/15: Since last visit, now on northera 100mg  TID. Some supine hypertension (max SBP 183/99; but rare). Now with new onset of numbness in hands x 1 month, and numbness in toes since Aug 2016.   UPDATE 02/10/15: Since last visit, no new events. Still with orthostatic hypotension and lightheadedness and dizziness. Also struggling with severe low back pain radiating to the legs. Still on florinef.   PRIOR HPI (01/11/15): 54 year old left-handed male here for evaluation of orthostatic hypotension, dysautonomia. In June patient had left C1 herpes zoster ophthalmicus, treated with antivirals therapy. In July patient began to have intermittent  episodes of hypotension, dizziness, urinary retention and ultimately leading to syncope. Patient was evaluated at the hospital 3 times including most recent admission with discharge on 12/23/2014. During this time he has been diagnosed with prostatitis, mild renal insufficiency, postural orthostatic tachycardia syndrome. He was tried on Midodrine and Florinef, and ultimately discharged on Florinef plus hydrocortisone. Had problems with urinary sxs with midodrine. Outpatient urology follow-up is pending. For prostatitis he was discharged on anti-biotics to complete a 6 week course. He also has urology follow-up for urinary retention. Patient also set up for follow-up in my neurology clinic today. Patient still having significant problems standing and walking. He also has chronic low back pain, history of spinal stenosis status post surgery. Patient has had back surgeries in 2002 and 2009. He has chronic pain syndrome. Is on long-term narcotic medications.   REVIEW OF SYSTEMS: Full 14 system review of systems performed and notable only for blurred vision passing out (x 2 in last 3 weeks) chest pain.   ALLERGIES: Allergies  Allergen Reactions  . Suboxone [Buprenorphine Hcl-Naloxone Hcl] Itching and Nausea Only    dizziness  . Losartan Other (See Comments)    headaches  . Midodrine Hcl Other (See Comments)    Urinary retention  . Reglan [Metoclopramide] Other (See Comments)    Altered mental status  . Antihistamines, Chlorpheniramine-Type Other (See Comments)    Hallucinations  . Pyridium [Phenazopyridine Hcl] Hives and Other (See Comments)    Dizziness and indigestion    HOME MEDICATIONS: Outpatient Medications Prior to Visit  Medication Sig Dispense Refill  . atorvastatin (LIPITOR) 10 MG tablet 1 TABLET ONCE A DAY ORALLY  30 DAY(S)  5  . Cyanocobalamin (B-12) 5000 MCG CAPS Take by mouth daily.    Tery Sanfilippo Sodium (COLACE PO) Take by mouth.    . gabapentin (NEURONTIN) 300 MG capsule Take 2  capsules 3 times a day. 180 capsule 11  . lisinopril (ZESTRIL) 40 MG tablet Take 40 mg by mouth daily.    . meloxicam (MOBIC) 15 MG tablet Take 15 mg by mouth daily.  0  . metFORMIN (GLUCOPHAGE) 1000 MG tablet Take 1,000 mg by mouth 2 (two) times daily.  1  . timolol (TIMOPTIC) 0.5 % ophthalmic solution 1 drop into affected eye    . ALPRAZolam (XANAX) 0.5 MG tablet for sedation before MRI scan; take 1 tab 1 hour before scan; may repeat 1 tab 15 min before scan 3 tablet 0  . timolol (BETIMOL) 0.25 % ophthalmic solution Place 1 drop into both eyes daily.    Marland Kitchen amitriptyline (ELAVIL) 25 MG tablet Take 1 tablet (25 mg total) by mouth at bedtime. (Patient not taking: No sig reported) 30 tablet 6  . rizatriptan (MAXALT-MLT) 10 MG disintegrating tablet Take 1 tablet (10 mg total) by mouth as needed for migraine. May repeat in 2 hours if needed (Patient not taking: Reported on 04/14/2020) 9 tablet 11   No facility-administered medications prior to visit.    PAST MEDICAL HISTORY: Past Medical History:  Diagnosis Date  . Autonomic dysfunction 10/01/2015  . Back pain   . COVID-19 04/2019  . DDD (degenerative disc disease), cervical   . Diabetes mellitus without complication (HCC)    type 2  . Essential hypertension 06/04/2015  . Headache    migraines  . History of shingles   . Hypercholesterolemia   . Hypertension   . Lumbago with sciatica, right side   . Memory changes   . Migraine   . Shingles   . Spinal stenosis     PAST SURGICAL HISTORY: Past Surgical History:  Procedure Laterality Date  . BACK SURGERY  2002,2009   x 2, lower , job injury, spinal stenosis  . WISDOM TOOTH EXTRACTION  2007    FAMILY HISTORY: Family History  Problem Relation Age of Onset  . Heart disease Maternal Grandmother   . Hypertension Maternal Grandmother   . Hyperlipidemia Maternal Grandmother   . Cancer Maternal Grandfather   . Hypertension Maternal Grandfather   . Hypertension Mother   . Cancer Mother      SOCIAL HISTORY:  Social History   Socioeconomic History  . Marital status: Married    Spouse name: Ava  . Number of children: 4  . Years of education: 58  . Highest education level: Not on file  Occupational History    Comment: disabled  Tobacco Use  . Smoking status: Former Smoker    Quit date: 05/02/2007    Years since quitting: 12.9  . Smokeless tobacco: Never Used  Substance and Sexual Activity  . Alcohol use: No    Alcohol/week: 0.0 standard drinks  . Drug use: No  . Sexual activity: Yes  Other Topics Concern  . Not on file  Social History Narrative   Lives at home with wife, children   Caffeine use- 1 soda a day         Epworth Sleepiness Scale = 4 (as of 03/17/15)   Social Determinants of Health   Financial Resource Strain: Not on file  Food Insecurity: Not on file  Transportation Needs: Not on file  Physical Activity: Not on file  Stress:  Not on file  Social Connections: Not on file  Intimate Partner Violence: Not on file     PHYSICAL EXAM  GENERAL EXAM/CONSTITUTIONAL: Vitals:  Vitals:   04/14/20 1402  BP: 136/78  Pulse: 84  Weight: 271 lb 9.6 oz (123.2 kg)  Height: 5\' 11"  (1.803 m)    Body mass index is 37.88 kg/m. No exam data present  Patient is in no distress; well developed, nourished and groomed; neck is supple   CARDIOVASCULAR:  Examination of carotid arteries is normal; no carotid bruits  Regular rate and rhythm, no murmurs  Examination of peripheral vascular system by observation and palpation is normal  EYES:  Ophthalmoscopic exam of optic discs and posterior segments is normal; no papilledema or hemorrhages  MUSCULOSKELETAL:  Gait, strength, tone, movements noted in Neurologic exam below  NEUROLOGIC: MENTAL STATUS:  No flowsheet data found.  awake, alert, oriented to person, place and time  recent and remote memory intact  normal attention and concentration  language fluent, comprehension intact, naming  intact,   fund of knowledge appropriate  CRANIAL NERVE:   2nd, 3rd, 4th, 6th - pupils equal and reactive to light, visual fields full to confrontation, extraocular muscles intact, no nystagmus  5th - facial sensation symmetric  7th - facial strength symmetric  8th - hearing intact  9th - palate elevates symmetrically, uvula midline  11th - shoulder shrug symmetric  12th - tongue protrusion midline  MOTOR:   normal bulk and tone; BUE 5; BLE 5  SENSORY:   normal and symmetric to light touch, temperature, vibration  COORDINATION:   finger-nose-finger, fine finger movements SLOW  REFLEXES:   deep tendon reflexes TRACE and symmetric; ABSENT AT KNEE AND ANKLES  GAIT/STATION:   narrow based gait    DIAGNOSTIC DATA (LABS, IMAGING, TESTING) - I reviewed patient records, labs, notes, testing and imaging myself where available.  Lab Results  Component Value Date   WBC 6.6 01/05/2016   HGB 15.8 01/05/2016   HCT 44.3 01/05/2016   MCV 96.9 01/05/2016   PLT 170 01/05/2016      Component Value Date/Time   NA 144 06/01/2015 0001   K 3.5 06/01/2015 0001   CL 105 06/01/2015 0001   CO2 29 06/01/2015 0001   GLUCOSE 93 06/01/2015 0001   BUN 17 06/01/2015 0001   BUN 8 02/22/2015 0000   CREATININE 0.96 06/01/2015 0001   CREATININE 0.76 03/22/2015 1028   CALCIUM 9.0 06/01/2015 0001   PROT 8.2 (H) 01/05/2016 0003   ALBUMIN 4.2 01/05/2016 0003   AST 22 01/05/2016 0003   ALT 25 01/05/2016 0003   ALKPHOS 97 01/05/2016 0003   BILITOT 0.7 01/05/2016 0003   GFRNONAA >60 06/01/2015 0001   GFRNONAA >89 03/22/2015 1028   GFRAA >60 06/01/2015 0001   GFRAA >89 03/22/2015 1028   No results found for: CHOL, HDL, LDLCALC, LDLDIRECT, TRIG, CHOLHDL Lab Results  Component Value Date   HGBA1C 4.9 03/22/2015   Lab Results  Component Value Date   VITAMINB12 378 12/22/2014   Lab Results  Component Value Date   TSH 1.145 12/04/2014    12/17/14 CT head [I reviewed images  myself and agree with interpretation. -VRP]  - negative   12/08/14 MRI right hip - Negative examination. There is no evidence of septic hip. No abnormality to explain the patient's symptoms is identified.   11/30/14 MRI lumbar spine  1. Overall stable appearance of the lumbar spine with sequelae of prior decompressive laminectomy at L4-5. No  recurrent stenosis at this level. 2. Congenital spinal stenosis with superimposed mild multilevel degenerative changes as above, overall relatively similar to prior MRI from 2011. No severe canal stenosis or evidence of cord compression. 3. Distended urinary bladder.  02/08/15 MRI brain  - normal  01/27/15 MRI cervical spine (with and without) demonstrating: 1. At C4-5: leftward disc bulging with severe left foraminal stenosis 2. At C5-6: uncovertebral joint hypertrophy with moderate left foraminal stenosis  3. At C6-7: uncovertebral joint hypertrophy and facet hypertrophy with mild right and moderate left foraminal stenosis  4. No intrinsic spinal cord lesions.  01/27/15 MRI thoracic spine (with and without) demonstrating: 1. At T3-4: right uncovertebral joint hypertrophy with moderate right foraminal stenosis  2. At T8-9: disc bulging with no spinal stenosis or foraminal narrowing  3. No intrinsic or compressive spinal cord lesions.   04/22/15 EMG/NCS - Widespread sensorimotor polyneuropathy with axonal and demyelinating features. Active and chronic denervation changes noted on each needle EMG. Given the clinical context, considerations include immune neuropathies (CIDP, anti-sulfatide, anti-MAG, anti-GM1, monoclonal gammopathy), hereditary neuropathies (CMT, HMSN), infectious neuropathies (HIV) or metabolic causes (diabetes, hypothyroidism, liver disease).  05/06/15 CSF (WBC 1, RBC 1, protein 110, glucose 62)      ASSESSMENT AND PLAN  54 y.o. year old male here with history of lumbar spinal stenosis, status post decompression surgery, chronic  pain, now with progressive medical and neurologic decline since June 2016. Patient had left V1 herpes zoster ophthalmicus in June 2016 followed by progressive urinary retention, dizziness and now orthostatic intolerance/hypotension. 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 Some spontaneous improvement, with resolution by 2018.    Meds tried: amitriptyline, rizatriptan  Dx:   Migraine with aura and without status migrainosus, not intractable  OSA on CPAP  Memory loss  Insomnia, unspecified type     PLAN:  POST COVID HEADACHES, ANXIETY, FATIGUE - stable; continue supportive care  MIGRAINE PREVENTION  LIFESTYLE CHANGES - Stop or avoid smoking - Decrease or avoid caffeine / alcohol - Eat and sleep on a regular schedule - Exercise several times per week - start topiramate 50mg  at bedtime; after 1-2 weeks increase to 50mg  twice a day; drink plenty of water  consider - fremanezumab (Ajovy) 225mg  monthly (or 675mg  every 3 months) - galazanezumab (Emgality) 240mg  loading dose; then 120mg  monthly  MIGRAINE RESCUE  - ibuprofen, tylenol as needed - ubrelvy 50mg  at as needed for breakthrough headache; max 8 per month  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 topiramate (TOPAMAX) 50 MG tablet    Sig: Take 1 tablet (50 mg total) by mouth 2 (two) times daily.    Dispense:  60 tablet    Refill:  12  . Ubrogepant (UBRELVY) 50 MG TABS    Sig: Take 50 mg by mouth as needed. May repeat x 1 tab after 2 hours; max 2 tabs per day or 8 per month    Dispense:  8 tablet    Refill:  6   Return in about 1 year (around 04/14/2021) for with NP (Amy Lomax).    Suanne MarkerVIKRAM R. Sarrah Fiorenza, MD 04/14/2020, 2:19 PM Certified in Neurology, Neurophysiology and Neuroimaging  [facility] [address], [address] [place], [place] [address] [phone]

## 2020-04-14 NOTE — Telephone Encounter (Signed)
Jimmy [name] Tracks PA form signed and faxed to [employer].

## 2020-04-14 NOTE — Patient Instructions (Addendum)
MIGRAINE PREVENTION  LIFESTYLE CHANGES - Stop or avoid smoking - Decrease or avoid caffeine / alcohol - Eat and sleep on a regular schedule - Exercise several times per week - start topiramate 50mg  at bedtime; after 1-2 weeks increase to 50mg  twice a day; drink plenty of water  consider - fremanezumab (Ajovy) 225mg  monthly (or 675mg  every 3 months) - galazanezumab (Emgality) 240mg  loading dose; then 120mg  monthly   MIGRAINE RESCUE  - ibuprofen, tylenol as needed - ubrelvy 50mg  at as needed for breakthrough headache; max 8 per month

## 2020-04-22 NOTE — Telephone Encounter (Signed)
[facility] Tobi Bastos) not able to do a PA because coming up no active coverage. We only have the ID number for the discount card, evening with that coming up a a lot of money for Ubrogepant (UBRELVY) 50 MG TABS. Contact info: [phone]

## 2020-04-22 NOTE — Telephone Encounter (Addendum)
Called patient and advised of previous conversations and that it appears Jimmy [name] will be costly under Medicare only. He stated that in Jan he'll look at adding a prescription plan to his Medicare. He stated he isn't having headaches at this time. He stated that maybe another medication could be considered.  I advised will let Dr Marjory Lies know. Patient verbalized understanding, appreciation.

## 2020-04-22 NOTE — Telephone Encounter (Addendum)
Called [facility] and advised that they do not do PA for Medicare patients. Called [facility] for Rx insurance information, spoke with [place]. She stated with his current insurance Bernita Raisin will cost him Co pay $752.00.  Patient's Rx insurance:  [employer] ID#  [id number], group [id number], Bin [id number], PCN [id number]. Unable to find eligibility on CMM. Called patient who stated he doesn't have drug card, is not using wife's insurance, stated he doesn't have a supplemental plan. Called [facility] [phone], spoke with Corrie Dandy who stated his [employer] insurance expired in 2019. No current insurance on file. Transfered me to [facility] prescription services [phone]. Call was cut off. Called back, spoke with Tobi Bastos who was unable to pull patient up. She attempted to reach [facility] to verify #, was on extensive hold. She stated she will continue to try and reach [facility] and call me back today.

## 2020-06-08 ENCOUNTER — Ambulatory Visit: Payer: Medicare Other | Admitting: Family Medicine

## 2020-06-24 DIAGNOSIS — H0102A Squamous blepharitis right eye, upper and lower eyelids: Secondary | ICD-10-CM | POA: Diagnosis not present

## 2020-06-24 DIAGNOSIS — H40023 Open angle with borderline findings, high risk, bilateral: Secondary | ICD-10-CM | POA: Diagnosis not present

## 2020-06-24 DIAGNOSIS — H2513 Age-related nuclear cataract, bilateral: Secondary | ICD-10-CM | POA: Diagnosis not present

## 2020-06-24 DIAGNOSIS — H10413 Chronic giant papillary conjunctivitis, bilateral: Secondary | ICD-10-CM | POA: Diagnosis not present

## 2020-06-24 DIAGNOSIS — H1712 Central corneal opacity, left eye: Secondary | ICD-10-CM | POA: Diagnosis not present

## 2020-06-24 DIAGNOSIS — H16223 Keratoconjunctivitis sicca, not specified as Sjogren's, bilateral: Secondary | ICD-10-CM | POA: Diagnosis not present

## 2020-06-24 DIAGNOSIS — E119 Type 2 diabetes mellitus without complications: Secondary | ICD-10-CM | POA: Diagnosis not present

## 2020-06-24 DIAGNOSIS — H0102B Squamous blepharitis left eye, upper and lower eyelids: Secondary | ICD-10-CM | POA: Diagnosis not present

## 2020-08-27 DIAGNOSIS — E1169 Type 2 diabetes mellitus with other specified complication: Secondary | ICD-10-CM | POA: Diagnosis not present

## 2020-08-27 DIAGNOSIS — Z79899 Other long term (current) drug therapy: Secondary | ICD-10-CM | POA: Diagnosis not present

## 2020-08-27 DIAGNOSIS — I10 Essential (primary) hypertension: Secondary | ICD-10-CM | POA: Diagnosis not present

## 2020-08-27 DIAGNOSIS — M5440 Lumbago with sciatica, unspecified side: Secondary | ICD-10-CM | POA: Diagnosis not present

## 2020-08-27 DIAGNOSIS — R399 Unspecified symptoms and signs involving the genitourinary system: Secondary | ICD-10-CM | POA: Diagnosis not present

## 2020-08-27 DIAGNOSIS — E7800 Pure hypercholesterolemia, unspecified: Secondary | ICD-10-CM | POA: Diagnosis not present

## 2020-08-27 DIAGNOSIS — G8929 Other chronic pain: Secondary | ICD-10-CM | POA: Diagnosis not present

## 2020-09-14 ENCOUNTER — Encounter: Payer: Self-pay | Admitting: Family Medicine

## 2020-09-14 ENCOUNTER — Ambulatory Visit (INDEPENDENT_AMBULATORY_CARE_PROVIDER_SITE_OTHER): Payer: Medicare Other | Admitting: Family Medicine

## 2020-09-14 VITALS — BP 132/78 | HR 75 | Ht 71.0 in | Wt 256.0 lb

## 2020-09-14 DIAGNOSIS — Z9989 Dependence on other enabling machines and devices: Secondary | ICD-10-CM

## 2020-09-14 DIAGNOSIS — G43109 Migraine with aura, not intractable, without status migrainosus: Secondary | ICD-10-CM

## 2020-09-14 DIAGNOSIS — Z789 Other specified health status: Secondary | ICD-10-CM

## 2020-09-14 DIAGNOSIS — G4733 Obstructive sleep apnea (adult) (pediatric): Secondary | ICD-10-CM

## 2020-09-14 NOTE — Progress Notes (Signed)
CM sent to [employer] 

## 2020-09-14 NOTE — Patient Instructions (Addendum)
I would recommend continuing CPAP, however, I understand you are not comfortable with the expense and do not feel it has been helpful.  Getting used to CPAP and staying with the treatment long term does take time and patience and discipline. Untreated obstructive sleep apnea when it is moderate to severe can have an adverse impact on cardiovascular health and raise her risk for heart disease, arrhythmias, hypertension, congestive heart failure, stroke and diabetes. Untreated obstructive sleep apnea causes sleep disruption, nonrestorative sleep, and sleep deprivation. This can have an impact on your day to day functioning and cause daytime sleepiness and impairment of cognitive function, memory loss, mood disturbance, and problems focussing. Using CPAP regularly can improve these symptoms.  Continue topiramate 50mg  twice daily. Continue Tylenol for migraine abortion. You may follow up with PCP for continued refills. Call as needed or if you wish to resume CPAP therapy.   Sleep Apnea Sleep apnea affects breathing during sleep. It causes breathing to stop for a short time or to become shallow. It can also increase the risk of:  Heart attack.  Stroke.  Being very overweight (obese).  Diabetes.  Heart failure.  Irregular heartbeat. The goal of treatment is to help you breathe normally again. What are the causes? There are three kinds of sleep apnea:  Obstructive sleep apnea. This is caused by a blocked or collapsed airway.  Central sleep apnea. This happens when the brain does not send the right signals to the muscles that control breathing.  Mixed sleep apnea. This is a combination of obstructive and central sleep apnea. The most common cause of this condition is a collapsed or blocked airway. This can happen if:  Your throat muscles are too relaxed.  Your tongue and tonsils are too large.  You are overweight.  Your airway is too small.   What increases the risk?  Being  overweight.  Smoking.  Having a small airway.  Being older.  Being male.  Drinking alcohol.  Taking medicines to calm yourself (sedatives or tranquilizers).  Having family members with the condition. What are the signs or symptoms?  Trouble staying asleep.  Being sleepy or tired during the day.  Getting angry a lot.  Loud snoring.  Headaches in the morning.  Not being able to focus your mind (concentrate).  Forgetting things.  Less interest in sex.  Mood swings.  Personality changes.  Feelings of sadness (depression).  Waking up a lot during the night to pee (urinate).  Dry mouth.  Sore throat. How is this diagnosed?  Your medical history.  A physical exam.  A test that is done when you are sleeping (sleep study). The test is most often done in a sleep lab but may also be done at home. How is this treated?  Sleeping on your side.  Using a medicine to get rid of mucus in your nose (decongestant).  Avoiding the use of alcohol, medicines to help you relax, or certain pain medicines (narcotics).  Losing weight, if needed.  Changing your diet.  Not smoking.  Using a machine to open your airway while you sleep, such as: ? An oral appliance. This is a mouthpiece that shifts your lower jaw forward. ? A CPAP device. This device blows air through a mask when you breathe out (exhale). ? An EPAP device. This has valves that you put in each nostril. ? A BPAP device. This device blows air through a mask when you breathe in (inhale) and breathe out.  Having surgery  if other treatments do not work. It is important to get treatment for sleep apnea. Without treatment, it can lead to:  High blood pressure.  Coronary artery disease.  In men, not being able to have an erection (impotence).  Reduced thinking ability.   Follow these instructions at home: Lifestyle  Make changes that your doctor recommends.  Eat a healthy diet.  Lose weight if  needed.  Avoid alcohol, medicines to help you relax, and some pain medicines.  Do not use any products that contain nicotine or tobacco, such as cigarettes, e-cigarettes, and chewing tobacco. If you need help quitting, ask your doctor. General instructions  Take over-the-counter and prescription medicines only as told by your doctor.  If you were given a machine to use while you sleep, use it only as told by your doctor.  If you are having surgery, make sure to tell your doctor you have sleep apnea. You may need to bring your device with you.  Keep all follow-up visits as told by your doctor. This is important. Contact a doctor if:  The machine that you were given to use during sleep bothers you or does not seem to be working.  You do not get better.  You get worse. Get help right away if:  Your chest hurts.  You have trouble breathing in enough air.  You have an uncomfortable feeling in your back, arms, or stomach.  You have trouble talking.  One side of your body feels weak.  A part of your face is hanging down. These symptoms may be an emergency. Do not wait to see if the symptoms will go away. Get medical help right away. Call your local emergency services (911 in the [place] Do not drive yourself to the hospital. Summary  This condition affects breathing during sleep.  The most common cause is a collapsed or blocked airway.  The goal of treatment is to help you breathe normally while you sleep. This information is not intended to replace advice given to you by your health care provider. Make sure you discuss any questions you have with your health care provider. Document Revised: 02/01/2018 Document Reviewed: 12/11/2017 Elsevier Patient Education  2021 [employer].

## 2020-09-14 NOTE — Progress Notes (Signed)
PATIENT: Jimmy [name] DOB: April 26, 1966  REASON FOR VISIT: follow up HISTORY FROM: patient  Chief Complaint  Patient presents with  . Follow-up    RM 2, w/ wife Harmon Pier, migraine- have improved since taking Topamax. HA- have improved since drinking more water. Pt does still have some HA/migraine w/ weather change. Pt reports not using his CPAP. States it was not working for him.      HISTORY OF PRESENT ILLNESS: 09/14/20 ALL:  Jimmy [name] is a 55 y.o. male here today for follow up for OSA on CPAP and migraines. He was last seen by Dr Rexene Alberts in 03/2020. Compliance was at 50% with high air leak. He has not continued CPAP consistently. He last used machine in 06/2020. He does not feel it is helpful. He is upset about the cost of the machine and supplies. He does not feel he can afford it at this time.   Dr Leta Baptist started topiramate in 03/2020 for migraine prevention. Roselyn Meier made him feel badly. Tylenol has helped, recently. He is having about 5 headache days a month. He feels that they are usually easily aborted. Rarely has a headache that interferes with daily activity.     HISTORY: (copied from Dr Guadelupe Sabin previous note)  Mr. Jimmy [name] is a 55 year old right-handed gentleman with an underlying medical history of migraine headaches, memory changes, chronic low back pain with spinal stenosis, status post back surgery twice,hypertension, diabetes, hyperlipidemia, history of COVID-19, and obesity, who Presents for follow-up consultation of his obstructive sleep apnea after testing and starting CPAP therapy.  The patient is unaccompanied today.  I first met him at the request of Dr. Leta Baptist on 09/30/2019, at which time he reported snoring and daytime somnolence.  He was advised to proceed with sleep study testing.  He had a baseline sleep study, followed by a CPAP titration study.  His baseline sleep study from 10/10/2019 showed a sleep latency of 8 minutes, REM latency 84.5 minutes, sleep  efficiency 87.4%.  Total AHI was 12.7, REM AHI in the severe range at 55.5/h, supine AHI 21.8/h.  Average oxygen saturation 93%, nadir 74%.  Time below 89% saturation was 49 minutes for the night.  He had no significant PLM's, EKG or EEG changes.  He was advised to return for a full night titration study.  He had this on 11/05/2019.  Sleep efficiency was 85.4%, sleep latency 2 minutes, REM latency 57 minutes.  He had a mildly increased percentage of REM sleep at 28.2.  He was fitted with a medium nasal pillows and CPAP was titrated from 5 cm to 13 cm.  On the final pressure, his AHI was 0/h with supine REM sleep achieved and O2 nadir of 90%.  Based on his test results I prescribed CPAP therapy for home use.   Today, 03/04/20: I reviewed his CPAP compliance data from 02/02/2020 through 03/02/2020, which is a total of 30 days, during which time he used his machine 20 days with percent use days greater than 4 hours at 50%, indicating suboptimal compliance with an average usage of 4 hours and 22 minutes, residual AHI of 0.2/h, leak on the high side consistently with a 95th percentile at 54.4 L/min on a pressure of 13 cm with EPR of 3.  His compliance with better in the month of September, from 01/02/2020 through 01/31/2020, percent use days greater than 4 hours with 63%.  He has been on treatment approximately 77 days.  He reports that it took him a while to  get adjusted to treatment.  He is not quite feeling any significant results yet.  He is motivated to continue with treatment.  He has at times taken off the mask in the middle of the night and not realize it.   REVIEW OF SYSTEMS: Out of a complete 14 system review of symptoms, the patient complains only of the following symptoms, headaches and all other reviewed systems are negative.  ESS:2 FSS: 17   ALLERGIES: Allergies  Allergen Reactions  . Suboxone [Buprenorphine Hcl-Naloxone Hcl] Itching and Nausea Only    dizziness  . Losartan Other (See Comments)     headaches  . Midodrine Hcl Other (See Comments)    Urinary retention  . Reglan [Metoclopramide] Other (See Comments)    Altered mental status  . Antihistamines, Chlorpheniramine-Type Other (See Comments)    Hallucinations  . Pyridium [Phenazopyridine Hcl] Hives and Other (See Comments)    Dizziness and indigestion    HOME MEDICATIONS: Outpatient Medications Prior to Visit  Medication Sig Dispense Refill  . atorvastatin (LIPITOR) 10 MG tablet 1 TABLET ONCE A DAY ORALLY 30 DAY(S)  5  . Cyanocobalamin (B-12) 5000 MCG CAPS Take by mouth daily.    Mariane Baumgarten Sodium (COLACE PO) Take by mouth.    . gabapentin (NEURONTIN) 300 MG capsule Take 2 capsules 3 times a day. 180 capsule 11  . lisinopril (ZESTRIL) 40 MG tablet Take 40 mg by mouth daily.    . meloxicam (MOBIC) 15 MG tablet Take 15 mg by mouth daily.  0  . metFORMIN (GLUCOPHAGE) 1000 MG tablet Take 1,000 mg by mouth 2 (two) times daily.  1  . timolol (TIMOPTIC) 0.5 % ophthalmic solution 1 drop into affected eye    . topiramate (TOPAMAX) 50 MG tablet Take 1 tablet (50 mg total) by mouth 2 (two) times daily. 60 tablet 12  . Ubrogepant (UBRELVY) 50 MG TABS Take 50 mg by mouth as needed. May repeat x 1 tab after 2 hours; max 2 tabs per day or 8 per month 8 tablet 6   No facility-administered medications prior to visit.    PAST MEDICAL HISTORY: Past Medical History:  Diagnosis Date  . Autonomic dysfunction 10/01/2015  . Back pain   . COVID-19 04/2019  . DDD (degenerative disc disease), cervical   . Diabetes mellitus without complication ([place])    type 2  . Essential hypertension 06/04/2015  . Headache    migraines  . History of shingles   . Hypercholesterolemia   . Hypertension   . Lumbago with sciatica, right side   . Memory changes   . Migraine   . Shingles   . Spinal stenosis     PAST SURGICAL HISTORY: Past Surgical History:  Procedure Laterality Date  . BACK SURGERY  2002,2009   x 2, lower , job injury, spinal stenosis   . WISDOM TOOTH EXTRACTION  2007    FAMILY HISTORY: Family History  Problem Relation Age of Onset  . Heart disease Maternal Grandmother   . Hypertension Maternal Grandmother   . Hyperlipidemia Maternal Grandmother   . Cancer Maternal Grandfather   . Hypertension Maternal Grandfather   . Hypertension Mother   . Cancer Mother     SOCIAL HISTORY: Social History   Socioeconomic History  . Marital status: Married    Spouse name: Ava  . Number of children: 4  . Years of education: 66  . Highest education level: Not on file  Occupational History    Comment: disabled  Tobacco  Use  . Smoking status: Former Smoker    Quit date: 05/02/2007    Years since quitting: 13.3  . Smokeless tobacco: Never Used  Substance and Sexual Activity  . Alcohol use: No    Alcohol/week: 0.0 standard drinks  . Drug use: No  . Sexual activity: Yes  Other Topics Concern  . Not on file  Social History Narrative   Lives at home with wife, children   Caffeine use- 1 soda a day         Epworth Sleepiness Scale = 4 (as of 03/17/15)   Social Determinants of Health   Financial Resource Strain: Not on file  Food Insecurity: Not on file  Transportation Needs: Not on file  Physical Activity: Not on file  Stress: Not on file  Social Connections: Not on file  Intimate Partner Violence: Not on file     PHYSICAL EXAM  Vitals:   09/14/20 1049  BP: 132/78  Pulse: 75  Weight: 256 lb (116.1 kg)  Height: 5' 11"  (1.803 m)   Body mass index is 35.7 kg/m.  Generalized: Well developed, in no acute distress  Cardiology: normal rate and rhythm, no murmur noted Respiratory: clear to auscultation bilaterally  Neurological examination  Mentation: Alert oriented to time, place, history taking. Follows all commands speech and language fluent Cranial nerve II-XII: Pupils were equal round reactive to light. Extraocular movements were full, visual field were full  Motor: The motor testing reveals 5 over 5  strength of all 4 extremities. Good symmetric motor tone is noted throughout.  Gait and station: Gait is normal.    DIAGNOSTIC DATA (LABS, IMAGING, TESTING) - I reviewed patient records, labs, notes, testing and imaging myself where available.  No flowsheet data found.   Lab Results  Component Value Date   WBC 6.6 01/05/2016   HGB 15.8 01/05/2016   HCT 44.3 01/05/2016   MCV 96.9 01/05/2016   PLT 170 01/05/2016      Component Value Date/Time   NA 144 06/01/2015 0001   K 3.5 06/01/2015 0001   CL 105 06/01/2015 0001   CO2 29 06/01/2015 0001   GLUCOSE 93 06/01/2015 0001   BUN 17 06/01/2015 0001   BUN 8 02/22/2015 0000   CREATININE 0.96 06/01/2015 0001   CREATININE 0.76 03/22/2015 1028   CALCIUM 9.0 06/01/2015 0001   PROT 8.2 (H) 01/05/2016 0003   ALBUMIN 4.2 01/05/2016 0003   AST 22 01/05/2016 0003   ALT 25 01/05/2016 0003   ALKPHOS 97 01/05/2016 0003   BILITOT 0.7 01/05/2016 0003   GFRNONAA >60 06/01/2015 0001   GFRNONAA >89 03/22/2015 1028   GFRAA >60 06/01/2015 0001   GFRAA >89 03/22/2015 1028   No results found for: CHOL, HDL, LDLCALC, LDLDIRECT, TRIG, CHOLHDL Lab Results  Component Value Date   HGBA1C 4.9 03/22/2015   Lab Results  Component Value Date   VITAMINB12 378 12/22/2014   Lab Results  Component Value Date   TSH 1.145 12/04/2014     ASSESSMENT AND PLAN 55 y.o. year old male  has a past medical history of Autonomic dysfunction (10/01/2015), Back pain, COVID-19 (04/2019), DDD (degenerative disc disease), cervical, Diabetes mellitus without complication ([place]), Essential hypertension (06/04/2015), Headache, History of shingles, Hypercholesterolemia, Hypertension, Lumbago with sciatica, right side, Memory changes, Migraine, Shingles, and Spinal stenosis. here with     ICD-10-CM   1. OSA on CPAP  G47.33 For home use only DME continuous positive airway pressure (CPAP)   Z99.89   2. Difficulty  using continuous positive airway pressure (CPAP) device  Z78.9 For  home use only DME continuous positive airway pressure (CPAP)  3. Migraine with aura and without status migrainosus, not intractable  G43.[place] does not feel that CPAP has been helpful and does not wish to continue therapy. We have reviewed sleep study results and concerns of untreated sleep apnea. He verbalizes understanding but has not been happy with therapy and does not wish to continue. He is requesting an order from me stating to discontinue CPAP so that he may return CPAP machine. I will provide to DME at his request. He may continue topiramate refills with PCP. Continue Tylenol for abortive therapy. Healthy lifestyle habits encouraged. He will follow up with neurology for worsening headaches or should he chose to restart CPAP. He verbalizes understanding and agreement with this plan.    Orders Placed This Encounter  Procedures  . For home use only DME continuous positive airway pressure (CPAP)    Patient wishes to discontinue CPAP therapy. I have reviewed health benefits of CPAP therapy and possible adverse effects if discontinued. He has not noted any benefit and requests an order be sent to [facility] for discontinuation.    Order Specific Question:   Length of Need    Answer:   Lifetime    Order Specific Question:   Patient has OSA or probable OSA    Answer:   Yes    Order Specific Question:   Is the patient currently using CPAP in the home    Answer:   Yes    Order Specific Question:   Settings    Answer:   Other see comments    Order Specific Question:   CPAP supplies needed    Answer:   Mask, headgear, cushions, filters, heated tubing and water chamber     No orders of the defined types were placed in this encounter.     I spent 15 minutes with the patient. 50% of this time was spent counseling and educating patient on plan of care and medications.    Debbora Presto, FNP-C 09/14/2020, 11:25 AM [facility] [address] [place], [place] [address] [phone]

## 2020-09-21 DIAGNOSIS — N411 Chronic prostatitis: Secondary | ICD-10-CM | POA: Diagnosis not present

## 2020-09-21 DIAGNOSIS — R300 Dysuria: Secondary | ICD-10-CM | POA: Diagnosis not present

## 2020-09-23 NOTE — Progress Notes (Signed)
I reviewed note and agree with plan.   Tawny Raspberry R. Tala Eber, MD 09/23/2020, 10:37 AM Certified in Neurology, Neurophysiology and Neuroimaging  [facility] [address], [address] [place], [place] [address] [phone]  

## 2020-11-11 DIAGNOSIS — N401 Enlarged prostate with lower urinary tract symptoms: Secondary | ICD-10-CM | POA: Diagnosis not present

## 2020-11-11 DIAGNOSIS — R102 Pelvic and perineal pain: Secondary | ICD-10-CM | POA: Diagnosis not present

## 2020-11-11 DIAGNOSIS — N5201 Erectile dysfunction due to arterial insufficiency: Secondary | ICD-10-CM | POA: Diagnosis not present

## 2020-11-11 DIAGNOSIS — R350 Frequency of micturition: Secondary | ICD-10-CM | POA: Diagnosis not present

## 2020-12-16 ENCOUNTER — Ambulatory Visit: Payer: Self-pay | Admitting: Family Medicine

## 2020-12-27 DIAGNOSIS — R350 Frequency of micturition: Secondary | ICD-10-CM | POA: Diagnosis not present

## 2020-12-27 DIAGNOSIS — N401 Enlarged prostate with lower urinary tract symptoms: Secondary | ICD-10-CM | POA: Diagnosis not present

## 2020-12-27 DIAGNOSIS — R102 Pelvic and perineal pain: Secondary | ICD-10-CM | POA: Diagnosis not present

## 2021-01-05 DIAGNOSIS — M6289 Other specified disorders of muscle: Secondary | ICD-10-CM | POA: Diagnosis not present

## 2021-01-05 DIAGNOSIS — R350 Frequency of micturition: Secondary | ICD-10-CM | POA: Diagnosis not present

## 2021-01-05 DIAGNOSIS — M6281 Muscle weakness (generalized): Secondary | ICD-10-CM | POA: Diagnosis not present

## 2021-01-05 DIAGNOSIS — M62838 Other muscle spasm: Secondary | ICD-10-CM | POA: Diagnosis not present

## 2021-01-05 DIAGNOSIS — R102 Pelvic and perineal pain: Secondary | ICD-10-CM | POA: Diagnosis not present

## 2021-01-12 DIAGNOSIS — H40023 Open angle with borderline findings, high risk, bilateral: Secondary | ICD-10-CM | POA: Diagnosis not present

## 2021-01-12 DIAGNOSIS — E119 Type 2 diabetes mellitus without complications: Secondary | ICD-10-CM | POA: Diagnosis not present

## 2021-01-12 DIAGNOSIS — H1013 Acute atopic conjunctivitis, bilateral: Secondary | ICD-10-CM | POA: Diagnosis not present

## 2021-01-12 DIAGNOSIS — H3561 Retinal hemorrhage, right eye: Secondary | ICD-10-CM | POA: Diagnosis not present

## 2021-01-12 DIAGNOSIS — H16223 Keratoconjunctivitis sicca, not specified as Sjogren's, bilateral: Secondary | ICD-10-CM | POA: Diagnosis not present

## 2021-01-12 DIAGNOSIS — H02054 Trichiasis without entropian left upper eyelid: Secondary | ICD-10-CM | POA: Diagnosis not present

## 2021-01-16 DIAGNOSIS — R0602 Shortness of breath: Secondary | ICD-10-CM | POA: Diagnosis not present

## 2021-01-16 DIAGNOSIS — J208 Acute bronchitis due to other specified organisms: Secondary | ICD-10-CM | POA: Diagnosis not present

## 2021-01-16 DIAGNOSIS — R42 Dizziness and giddiness: Secondary | ICD-10-CM | POA: Diagnosis not present

## 2021-01-19 DIAGNOSIS — R102 Pelvic and perineal pain: Secondary | ICD-10-CM | POA: Diagnosis not present

## 2021-01-19 DIAGNOSIS — R351 Nocturia: Secondary | ICD-10-CM | POA: Diagnosis not present

## 2021-01-19 DIAGNOSIS — M6289 Other specified disorders of muscle: Secondary | ICD-10-CM | POA: Diagnosis not present

## 2021-01-19 DIAGNOSIS — R350 Frequency of micturition: Secondary | ICD-10-CM | POA: Diagnosis not present

## 2021-01-19 DIAGNOSIS — M6281 Muscle weakness (generalized): Secondary | ICD-10-CM | POA: Diagnosis not present

## 2021-01-19 DIAGNOSIS — M62838 Other muscle spasm: Secondary | ICD-10-CM | POA: Diagnosis not present

## 2021-01-21 DIAGNOSIS — R059 Cough, unspecified: Secondary | ICD-10-CM | POA: Diagnosis not present

## 2021-02-07 DIAGNOSIS — Z79899 Other long term (current) drug therapy: Secondary | ICD-10-CM | POA: Diagnosis not present

## 2021-02-10 DIAGNOSIS — H10413 Chronic giant papillary conjunctivitis, bilateral: Secondary | ICD-10-CM | POA: Diagnosis not present

## 2021-02-10 DIAGNOSIS — H1013 Acute atopic conjunctivitis, bilateral: Secondary | ICD-10-CM | POA: Diagnosis not present

## 2021-02-10 DIAGNOSIS — E119 Type 2 diabetes mellitus without complications: Secondary | ICD-10-CM | POA: Diagnosis not present

## 2021-02-10 DIAGNOSIS — H3561 Retinal hemorrhage, right eye: Secondary | ICD-10-CM | POA: Diagnosis not present

## 2021-02-10 DIAGNOSIS — H02054 Trichiasis without entropian left upper eyelid: Secondary | ICD-10-CM | POA: Diagnosis not present

## 2021-02-10 DIAGNOSIS — H1712 Central corneal opacity, left eye: Secondary | ICD-10-CM | POA: Diagnosis not present

## 2021-03-01 DIAGNOSIS — I10 Essential (primary) hypertension: Secondary | ICD-10-CM | POA: Diagnosis not present

## 2021-03-01 DIAGNOSIS — Z79899 Other long term (current) drug therapy: Secondary | ICD-10-CM | POA: Diagnosis not present

## 2021-03-01 DIAGNOSIS — E1169 Type 2 diabetes mellitus with other specified complication: Secondary | ICD-10-CM | POA: Diagnosis not present

## 2021-03-01 DIAGNOSIS — Z23 Encounter for immunization: Secondary | ICD-10-CM | POA: Diagnosis not present

## 2021-03-01 DIAGNOSIS — E7800 Pure hypercholesterolemia, unspecified: Secondary | ICD-10-CM | POA: Diagnosis not present

## 2021-04-05 DIAGNOSIS — E7800 Pure hypercholesterolemia, unspecified: Secondary | ICD-10-CM | POA: Diagnosis not present

## 2021-04-05 DIAGNOSIS — G8929 Other chronic pain: Secondary | ICD-10-CM | POA: Diagnosis not present

## 2021-04-05 DIAGNOSIS — Z125 Encounter for screening for malignant neoplasm of prostate: Secondary | ICD-10-CM | POA: Diagnosis not present

## 2021-04-05 DIAGNOSIS — Z79899 Other long term (current) drug therapy: Secondary | ICD-10-CM | POA: Diagnosis not present

## 2021-04-05 DIAGNOSIS — Z0001 Encounter for general adult medical examination with abnormal findings: Secondary | ICD-10-CM | POA: Diagnosis not present

## 2021-04-05 DIAGNOSIS — M5440 Lumbago with sciatica, unspecified side: Secondary | ICD-10-CM | POA: Diagnosis not present

## 2021-04-05 DIAGNOSIS — I10 Essential (primary) hypertension: Secondary | ICD-10-CM | POA: Diagnosis not present

## 2021-04-05 DIAGNOSIS — E1169 Type 2 diabetes mellitus with other specified complication: Secondary | ICD-10-CM | POA: Diagnosis not present

## 2021-04-14 ENCOUNTER — Encounter: Payer: Self-pay | Admitting: Family Medicine

## 2021-04-14 ENCOUNTER — Ambulatory Visit (INDEPENDENT_AMBULATORY_CARE_PROVIDER_SITE_OTHER): Payer: Medicare Other | Admitting: Family Medicine

## 2021-04-14 VITALS — BP 122/74 | HR 88 | Ht 71.0 in | Wt 260.4 lb

## 2021-04-14 DIAGNOSIS — H02054 Trichiasis without entropian left upper eyelid: Secondary | ICD-10-CM | POA: Diagnosis not present

## 2021-04-14 DIAGNOSIS — H43811 Vitreous degeneration, right eye: Secondary | ICD-10-CM | POA: Diagnosis not present

## 2021-04-14 DIAGNOSIS — G43109 Migraine with aura, not intractable, without status migrainosus: Secondary | ICD-10-CM | POA: Diagnosis not present

## 2021-04-14 DIAGNOSIS — H3561 Retinal hemorrhage, right eye: Secondary | ICD-10-CM | POA: Diagnosis not present

## 2021-04-14 DIAGNOSIS — E119 Type 2 diabetes mellitus without complications: Secondary | ICD-10-CM | POA: Diagnosis not present

## 2021-04-14 MED ORDER — TOPIRAMATE 50 MG PO TABS: 50.0000 mg | ORAL_TABLET | Freq: Two times a day (BID) | ORAL | 3 refills | Status: AC

## 2021-04-14 NOTE — Progress Notes (Signed)
PATIENT: Jimmy [name] DOB: Nov 28, 1965  REASON FOR VISIT: follow up HISTORY FROM: patient  Chief Complaint  Patient presents with   Obstructive Sleep Apnea    Rm 16, alone. Here for yearly HA and CPAP f/u. Pt reports no longer using CPAP since March. Pt reports HA during rainy days.       HISTORY OF PRESENT ILLNESS: 04/14/21 ALL:  Jimmy [name] returns for follow up for headaches. He was last seen 08/2020 and wished to discontinue CPAP therapy. He was advised to follow up with PCP for headache management and refills of topiramate.   Since, he has done well. He does have milder headaches with weather changes or if dehydration. He may have 4-5 headaches a month. No migraines. He continue topiramate 37m BID and tolerating well. Tylenol helps with abortive needs.   09/14/2020 ALL: Jimmy [name] a 55y.o. male here today for follow up for OSA on CPAP and migraines. He was last seen by Jimmy [name] 03/2020. Compliance was at 50% with high air leak. He has not continued CPAP consistently. He last used machine in 06/2020. He does not feel it is helpful. He is upset about the cost of the machine and supplies. He does not feel he can afford it at this time.   Jimmy PLeta Baptiststarted topiramate in 03/2020 for migraine prevention. URoselyn Meiermade him feel badly. Tylenol has helped, recently. He is having about 5 headache days a month. He feels that they are usually easily aborted. Rarely has a headache that interferes with daily activity.     HISTORY: (copied from Jimmy AGuadelupe Sabinprevious note)  Jimmy [name] a 55year old right-handed gentleman with an underlying medical history of migraine headaches, memory changes, chronic low back pain with spinal stenosis, status post back surgery twice, hypertension, diabetes, hyperlipidemia, history of COVID-19, and obesity, who Presents for follow-up consultation of his obstructive sleep apnea after testing and starting CPAP therapy.  The patient is unaccompanied  today.  I first met him at the request of Jimmy. PLeta Baptiston 09/30/2019, at which time he reported snoring and daytime somnolence.  He was advised to proceed with sleep study testing.  He had a baseline sleep study, followed by a CPAP titration study.  His baseline sleep study from 10/10/2019 showed a sleep latency of 8 minutes, REM latency 84.5 minutes, sleep efficiency 87.4%.  Total AHI was 12.7, REM AHI in the severe range at 55.5/h, supine AHI 21.8/h.  Average oxygen saturation 93%, nadir 74%.  Time below 89% saturation was 49 minutes for the night.  He had no significant PLM's, EKG or EEG changes.  He was advised to return for a full night titration study.  He had this on 11/05/2019.  Sleep efficiency was 85.4%, sleep latency 2 minutes, REM latency 57 minutes.  He had a mildly increased percentage of REM sleep at 28.2.  He was fitted with a medium nasal pillows and CPAP was titrated from 5 cm to 13 cm.  On the final pressure, his AHI was 0/h with supine REM sleep achieved and O2 nadir of 90%.  Based on his test results I prescribed CPAP therapy for home use.   Today, 03/04/20: I reviewed his CPAP compliance data from 02/02/2020 through 03/02/2020, which is a total of 30 days, during which time he used his machine 20 days with percent use days greater than 4 hours at 50%, indicating suboptimal compliance with an average usage of 4 hours and 22 minutes, residual AHI of 0.2/h,  leak on the high side consistently with a 95th percentile at 54.4 L/min on a pressure of 13 cm with EPR of 3.  His compliance with better in the month of September, from 01/02/2020 through 01/31/2020, percent use days greater than 4 hours with 63%.  He has been on treatment approximately 77 days.  He reports that it took him a while to get adjusted to treatment.  He is not quite feeling any significant results yet.  He is motivated to continue with treatment.  He has at times taken off the mask in the middle of the night and not realize it.     REVIEW OF SYSTEMS: Out of a complete 14 system review of symptoms, the patient complains only of the following symptoms, headaches and all other reviewed systems are negative.  ESS: 6   ALLERGIES: Allergies  Allergen Reactions   Suboxone [Buprenorphine Hcl-Naloxone Hcl] Itching and Nausea Only    dizziness   Losartan Other (See Comments)    headaches   Midodrine Hcl Other (See Comments)    Urinary retention   Reglan [Metoclopramide] Other (See Comments)    Altered mental status   Antihistamines, Chlorpheniramine-Type Other (See Comments)    Hallucinations   Pyridium [Phenazopyridine Hcl] Hives and Other (See Comments)    Dizziness and indigestion    HOME MEDICATIONS: Outpatient Medications Prior to Visit  Medication Sig Dispense Refill   atorvastatin (LIPITOR) 10 MG tablet 1 TABLET ONCE A DAY ORALLY 30 DAY(S)  5   Cyanocobalamin (B-12) 5000 MCG CAPS Take by mouth daily.     Docusate Sodium (COLACE PO) Take by mouth.     gabapentin (NEURONTIN) 300 MG capsule Take 2 capsules 3 times a day. 180 capsule 11   lisinopril (ZESTRIL) 40 MG tablet Take 40 mg by mouth daily.     meloxicam (MOBIC) 15 MG tablet Take 15 mg by mouth daily.  0   metFORMIN (GLUCOPHAGE) 1000 MG tablet Take 1,000 mg by mouth 2 (two) times daily.  1   timolol (TIMOPTIC) 0.5 % ophthalmic solution 1 drop into affected eye     topiramate (TOPAMAX) 50 MG tablet Take 1 tablet (50 mg total) by mouth 2 (two) times daily. 60 tablet 12   No facility-administered medications prior to visit.    PAST MEDICAL HISTORY: Past Medical History:  Diagnosis Date   Autonomic dysfunction 10/01/2015   Back pain    COVID-19 04/2019   DDD (degenerative disc disease), cervical    Diabetes mellitus without complication ([place])    type 2   Essential hypertension 06/04/2015   Headache    migraines   History of shingles    Hypercholesterolemia    Hypertension    Lumbago with sciatica, right side    Memory changes    Migraine     Shingles    Spinal stenosis     PAST SURGICAL HISTORY: Past Surgical History:  Procedure Laterality Date   BACK SURGERY  2002,2009   x 2, lower , job injury, spinal stenosis   WISDOM TOOTH EXTRACTION  2007    FAMILY HISTORY: Family History  Problem Relation Age of Onset   Heart disease Maternal Grandmother    Hypertension Maternal Grandmother    Hyperlipidemia Maternal Grandmother    Cancer Maternal Grandfather    Hypertension Maternal Grandfather    Hypertension Mother    Cancer Mother     SOCIAL HISTORY: Social History   Socioeconomic History   Marital status: Married    Spouse name:  Ava   Number of children: 4   Years of education: 12   Highest education level: Not on file  Occupational History    Comment: disabled  Tobacco Use   Smoking status: Former    Types: Cigarettes    Quit date: 05/02/2007    Years since quitting: 13.9   Smokeless tobacco: Never  Substance and Sexual Activity   Alcohol use: No    Alcohol/week: 0.0 standard drinks   Drug use: No   Sexual activity: Yes  Other Topics Concern   Not on file  Social History Narrative   Lives at home with wife, children   Caffeine use- 1 soda a day         Epworth Sleepiness Scale = 4 (as of 03/17/15)   Social Determinants of Health   Financial Resource Strain: Not on file  Food Insecurity: Not on file  Transportation Needs: Not on file  Physical Activity: Not on file  Stress: Not on file  Social Connections: Not on file  Intimate Partner Violence: Not on file     PHYSICAL EXAM  Vitals:   04/14/21 0904  BP: 122/74  Pulse: 88  Weight: 260 lb 6.4 oz (118.1 kg)  Height: _0  (1.803 m)    Body mass index is 36.32 kg/m.  Generalized: Well developed, in no acute distress  Cardiology: normal rate and rhythm, no murmur noted Respiratory: clear to auscultation bilaterally  Neurological examination  Mentation: Alert oriented to time, place, history taking. Follows all commands speech and  language fluent Cranial nerve II-XII: Pupils were equal round reactive to light. Extraocular movements were full, visual field were full  Motor: The motor testing reveals 5 over 5 strength of all 4 extremities. Good symmetric motor tone is noted throughout.  Gait and station: Gait is normal.    DIAGNOSTIC DATA (LABS, IMAGING, TESTING) - I reviewed patient records, labs, notes, testing and imaging myself where available.  No flowsheet data found.   Lab Results  Component Value Date   WBC 6.6 01/05/2016   HGB 15.8 01/05/2016   HCT 44.3 01/05/2016   MCV 96.9 01/05/2016   PLT 170 01/05/2016      Component Value Date/Time   NA 144 06/01/2015 0001   K 3.5 06/01/2015 0001   CL 105 06/01/2015 0001   CO2 29 06/01/2015 0001   GLUCOSE 93 06/01/2015 0001   BUN 17 06/01/2015 0001   BUN 8 02/22/2015 0000   CREATININE 0.96 06/01/2015 0001   CREATININE 0.76 03/22/2015 1028   CALCIUM 9.0 06/01/2015 0001   PROT 8.2 (H) 01/05/2016 0003   ALBUMIN 4.2 01/05/2016 0003   AST 22 01/05/2016 0003   ALT 25 01/05/2016 0003   ALKPHOS 97 01/05/2016 0003   BILITOT 0.7 01/05/2016 0003   GFRNONAA >60 06/01/2015 0001   GFRNONAA >89 03/22/2015 1028   GFRAA >60 06/01/2015 0001   GFRAA >89 03/22/2015 1028   No results found for: CHOL, HDL, LDLCALC, LDLDIRECT, TRIG, CHOLHDL Lab Results  Component Value Date   HGBA1C 4.9 03/22/2015   Lab Results  Component Value Date   VITAMINB12 378 12/22/2014   Lab Results  Component Value Date   TSH 1.145 12/04/2014     ASSESSMENT AND PLAN 55 y.o. year old male  has a past medical history of Autonomic dysfunction (10/01/2015), Back pain, COVID-19 (04/2019), DDD (degenerative disc disease), cervical, Diabetes mellitus without complication ([place]), Essential hypertension (06/04/2015), Headache, History of shingles, Hypercholesterolemia, Hypertension, Lumbago with sciatica, right side, Memory changes,  Migraine, Shingles, and Spinal stenosis. here with     ICD-10-CM    1. Migraine with aura and without status migrainosus, not intractable  G43.[address] reports that headaches are well managed. Rare migraines. He will continue topiramate 39m BID. He may continue topiramate refills with PCP. Continue Tylenol for abortive therapy. Healthy lifestyle habits encouraged. He will follow up with neurology for worsening headaches or should he chose to restart CPAP. He verbalizes understanding and agreement with this plan.    No orders of the defined types were placed in this encounter.    Meds ordered this encounter  Medications   topiramate (TOPAMAX) 50 MG tablet    Sig: Take 1 tablet (50 mg total) by mouth 2 (two) times daily.    Dispense:  180 tablet    Refill:  3      Hau Sanor, FNP-C 04/14/2021, 9:22 AM [facility] [address] [place] [phone]

## 2021-04-14 NOTE — Patient Instructions (Signed)
Below is our plan:  We will continue topiramate 50mg  twice daily. Continue OTC analgesics for abortive therapy.   Please make sure you are staying well hydrated. I recommend 50-60 ounces daily. Well balanced diet and regular exercise encouraged. Consistent sleep schedule with 6-8 hours recommended.   Please continue follow up with care team as directed.   Follow up with PCP for refills, call me if headaches worsen.   You may receive a survey regarding today's visit. I encourage you to leave honest feed back as I do use this information to improve patient care. Thank you for seeing me today!

## 2021-04-28 DIAGNOSIS — M25512 Pain in left shoulder: Secondary | ICD-10-CM | POA: Diagnosis not present

## 2021-05-04 DIAGNOSIS — M25512 Pain in left shoulder: Secondary | ICD-10-CM | POA: Diagnosis not present

## 2021-06-01 DIAGNOSIS — M25512 Pain in left shoulder: Secondary | ICD-10-CM | POA: Diagnosis not present

## 2021-07-07 DIAGNOSIS — M25512 Pain in left shoulder: Secondary | ICD-10-CM | POA: Diagnosis not present

## 2021-07-07 DIAGNOSIS — M25511 Pain in right shoulder: Secondary | ICD-10-CM | POA: Diagnosis not present

## 2021-07-12 ENCOUNTER — Other Ambulatory Visit: Payer: Self-pay

## 2021-07-12 ENCOUNTER — Ambulatory Visit
Admission: RE | Admit: 2021-07-12 | Discharge: 2021-07-12 | Disposition: A | Discharge status: Home / Self Care | Payer: Medicare Other | Source: Ambulatory Visit | Attending: Sports Medicine | Admitting: Sports Medicine

## 2021-07-12 ENCOUNTER — Other Ambulatory Visit: Payer: Self-pay | Admitting: Sports Medicine

## 2021-07-12 DIAGNOSIS — M19011 Primary osteoarthritis, right shoulder: Secondary | ICD-10-CM | POA: Diagnosis not present

## 2021-07-12 DIAGNOSIS — R52 Pain, unspecified: Secondary | ICD-10-CM

## 2021-07-12 DIAGNOSIS — M25711 Osteophyte, right shoulder: Secondary | ICD-10-CM | POA: Diagnosis not present

## 2021-07-12 DIAGNOSIS — M542 Cervicalgia: Secondary | ICD-10-CM | POA: Diagnosis not present

## 2021-08-09 DIAGNOSIS — M353 Polymyalgia rheumatica: Secondary | ICD-10-CM | POA: Diagnosis not present

## 2021-09-12 DIAGNOSIS — E7800 Pure hypercholesterolemia, unspecified: Secondary | ICD-10-CM | POA: Diagnosis not present

## 2021-09-12 DIAGNOSIS — Z0001 Encounter for general adult medical examination with abnormal findings: Secondary | ICD-10-CM | POA: Diagnosis not present

## 2021-09-12 DIAGNOSIS — I10 Essential (primary) hypertension: Secondary | ICD-10-CM | POA: Diagnosis not present

## 2021-09-12 DIAGNOSIS — E1169 Type 2 diabetes mellitus with other specified complication: Secondary | ICD-10-CM | POA: Diagnosis not present

## 2021-09-12 DIAGNOSIS — Z125 Encounter for screening for malignant neoplasm of prostate: Secondary | ICD-10-CM | POA: Diagnosis not present

## 2021-09-12 DIAGNOSIS — G8929 Other chronic pain: Secondary | ICD-10-CM | POA: Diagnosis not present

## 2021-09-12 DIAGNOSIS — Z79899 Other long term (current) drug therapy: Secondary | ICD-10-CM | POA: Diagnosis not present

## 2021-09-20 DIAGNOSIS — M25511 Pain in right shoulder: Secondary | ICD-10-CM | POA: Diagnosis not present

## 2021-10-05 DIAGNOSIS — R293 Abnormal posture: Secondary | ICD-10-CM | POA: Diagnosis not present

## 2021-10-05 DIAGNOSIS — M25611 Stiffness of right shoulder, not elsewhere classified: Secondary | ICD-10-CM | POA: Diagnosis not present

## 2021-10-05 DIAGNOSIS — M25411 Effusion, right shoulder: Secondary | ICD-10-CM | POA: Diagnosis not present

## 2021-10-05 DIAGNOSIS — M25511 Pain in right shoulder: Secondary | ICD-10-CM | POA: Diagnosis not present

## 2021-10-05 DIAGNOSIS — M6281 Muscle weakness (generalized): Secondary | ICD-10-CM | POA: Diagnosis not present

## 2021-10-11 DIAGNOSIS — M25411 Effusion, right shoulder: Secondary | ICD-10-CM | POA: Diagnosis not present

## 2021-10-11 DIAGNOSIS — M25511 Pain in right shoulder: Secondary | ICD-10-CM | POA: Diagnosis not present

## 2021-10-11 DIAGNOSIS — R293 Abnormal posture: Secondary | ICD-10-CM | POA: Diagnosis not present

## 2021-10-11 DIAGNOSIS — M6281 Muscle weakness (generalized): Secondary | ICD-10-CM | POA: Diagnosis not present

## 2021-10-11 DIAGNOSIS — M25611 Stiffness of right shoulder, not elsewhere classified: Secondary | ICD-10-CM | POA: Diagnosis not present

## 2021-10-14 DIAGNOSIS — M25611 Stiffness of right shoulder, not elsewhere classified: Secondary | ICD-10-CM | POA: Diagnosis not present

## 2021-10-14 DIAGNOSIS — M25511 Pain in right shoulder: Secondary | ICD-10-CM | POA: Diagnosis not present

## 2021-10-14 DIAGNOSIS — M25411 Effusion, right shoulder: Secondary | ICD-10-CM | POA: Diagnosis not present

## 2021-10-14 DIAGNOSIS — M6281 Muscle weakness (generalized): Secondary | ICD-10-CM | POA: Diagnosis not present

## 2021-10-14 DIAGNOSIS — R293 Abnormal posture: Secondary | ICD-10-CM | POA: Diagnosis not present

## 2021-10-21 DIAGNOSIS — R293 Abnormal posture: Secondary | ICD-10-CM | POA: Diagnosis not present

## 2021-10-21 DIAGNOSIS — M25511 Pain in right shoulder: Secondary | ICD-10-CM | POA: Diagnosis not present

## 2021-10-21 DIAGNOSIS — M6281 Muscle weakness (generalized): Secondary | ICD-10-CM | POA: Diagnosis not present

## 2021-10-21 DIAGNOSIS — M25611 Stiffness of right shoulder, not elsewhere classified: Secondary | ICD-10-CM | POA: Diagnosis not present

## 2021-10-21 DIAGNOSIS — M25411 Effusion, right shoulder: Secondary | ICD-10-CM | POA: Diagnosis not present

## 2021-10-26 DIAGNOSIS — M25511 Pain in right shoulder: Secondary | ICD-10-CM | POA: Diagnosis not present

## 2021-10-26 DIAGNOSIS — M25411 Effusion, right shoulder: Secondary | ICD-10-CM | POA: Diagnosis not present

## 2021-10-26 DIAGNOSIS — M6281 Muscle weakness (generalized): Secondary | ICD-10-CM | POA: Diagnosis not present

## 2021-10-26 DIAGNOSIS — R293 Abnormal posture: Secondary | ICD-10-CM | POA: Diagnosis not present

## 2021-10-26 DIAGNOSIS — M25611 Stiffness of right shoulder, not elsewhere classified: Secondary | ICD-10-CM | POA: Diagnosis not present

## 2021-10-28 DIAGNOSIS — M25611 Stiffness of right shoulder, not elsewhere classified: Secondary | ICD-10-CM | POA: Diagnosis not present

## 2021-10-28 DIAGNOSIS — R293 Abnormal posture: Secondary | ICD-10-CM | POA: Diagnosis not present

## 2021-10-28 DIAGNOSIS — M6281 Muscle weakness (generalized): Secondary | ICD-10-CM | POA: Diagnosis not present

## 2021-10-28 DIAGNOSIS — M25411 Effusion, right shoulder: Secondary | ICD-10-CM | POA: Diagnosis not present

## 2021-10-28 DIAGNOSIS — M25511 Pain in right shoulder: Secondary | ICD-10-CM | POA: Diagnosis not present

## 2021-11-09 DIAGNOSIS — M6281 Muscle weakness (generalized): Secondary | ICD-10-CM | POA: Diagnosis not present

## 2021-11-09 DIAGNOSIS — M25611 Stiffness of right shoulder, not elsewhere classified: Secondary | ICD-10-CM | POA: Diagnosis not present

## 2021-11-09 DIAGNOSIS — M25511 Pain in right shoulder: Secondary | ICD-10-CM | POA: Diagnosis not present

## 2021-11-09 DIAGNOSIS — M25411 Effusion, right shoulder: Secondary | ICD-10-CM | POA: Diagnosis not present

## 2021-11-09 DIAGNOSIS — R293 Abnormal posture: Secondary | ICD-10-CM | POA: Diagnosis not present

## 2021-12-22 DIAGNOSIS — H43811 Vitreous degeneration, right eye: Secondary | ICD-10-CM | POA: Diagnosis not present

## 2021-12-22 DIAGNOSIS — H0102B Squamous blepharitis left eye, upper and lower eyelids: Secondary | ICD-10-CM | POA: Diagnosis not present

## 2021-12-22 DIAGNOSIS — H3561 Retinal hemorrhage, right eye: Secondary | ICD-10-CM | POA: Diagnosis not present

## 2021-12-22 DIAGNOSIS — E119 Type 2 diabetes mellitus without complications: Secondary | ICD-10-CM | POA: Diagnosis not present

## 2021-12-22 DIAGNOSIS — H2513 Age-related nuclear cataract, bilateral: Secondary | ICD-10-CM | POA: Diagnosis not present

## 2021-12-22 DIAGNOSIS — H10413 Chronic giant papillary conjunctivitis, bilateral: Secondary | ICD-10-CM | POA: Diagnosis not present

## 2021-12-22 DIAGNOSIS — H1712 Central corneal opacity, left eye: Secondary | ICD-10-CM | POA: Diagnosis not present

## 2021-12-22 DIAGNOSIS — H0102A Squamous blepharitis right eye, upper and lower eyelids: Secondary | ICD-10-CM | POA: Diagnosis not present

## 2021-12-22 DIAGNOSIS — H40023 Open angle with borderline findings, high risk, bilateral: Secondary | ICD-10-CM | POA: Diagnosis not present

## 2022-03-27 DIAGNOSIS — E7800 Pure hypercholesterolemia, unspecified: Secondary | ICD-10-CM | POA: Diagnosis not present

## 2022-03-27 DIAGNOSIS — E1169 Type 2 diabetes mellitus with other specified complication: Secondary | ICD-10-CM | POA: Diagnosis not present

## 2022-03-27 DIAGNOSIS — G8929 Other chronic pain: Secondary | ICD-10-CM | POA: Diagnosis not present

## 2022-03-27 DIAGNOSIS — I10 Essential (primary) hypertension: Secondary | ICD-10-CM | POA: Diagnosis not present

## 2022-03-27 DIAGNOSIS — Z79899 Other long term (current) drug therapy: Secondary | ICD-10-CM | POA: Diagnosis not present

## 2022-03-27 DIAGNOSIS — Z23 Encounter for immunization: Secondary | ICD-10-CM | POA: Diagnosis not present

## 2022-04-05 DIAGNOSIS — M25532 Pain in left wrist: Secondary | ICD-10-CM | POA: Diagnosis not present

## 2022-04-05 DIAGNOSIS — M25531 Pain in right wrist: Secondary | ICD-10-CM | POA: Diagnosis not present

## 2022-04-05 DIAGNOSIS — M25511 Pain in right shoulder: Secondary | ICD-10-CM | POA: Diagnosis not present

## 2022-05-17 DIAGNOSIS — M25511 Pain in right shoulder: Secondary | ICD-10-CM | POA: Diagnosis not present

## 2022-05-31 DIAGNOSIS — M25611 Stiffness of right shoulder, not elsewhere classified: Secondary | ICD-10-CM | POA: Diagnosis not present

## 2022-05-31 DIAGNOSIS — M25411 Effusion, right shoulder: Secondary | ICD-10-CM | POA: Diagnosis not present

## 2022-05-31 DIAGNOSIS — M25511 Pain in right shoulder: Secondary | ICD-10-CM | POA: Diagnosis not present

## 2022-05-31 DIAGNOSIS — R293 Abnormal posture: Secondary | ICD-10-CM | POA: Diagnosis not present

## 2022-05-31 DIAGNOSIS — M6281 Muscle weakness (generalized): Secondary | ICD-10-CM | POA: Diagnosis not present

## 2022-06-07 DIAGNOSIS — R293 Abnormal posture: Secondary | ICD-10-CM | POA: Diagnosis not present

## 2022-06-07 DIAGNOSIS — M25611 Stiffness of right shoulder, not elsewhere classified: Secondary | ICD-10-CM | POA: Diagnosis not present

## 2022-06-07 DIAGNOSIS — M6281 Muscle weakness (generalized): Secondary | ICD-10-CM | POA: Diagnosis not present

## 2022-06-07 DIAGNOSIS — M25411 Effusion, right shoulder: Secondary | ICD-10-CM | POA: Diagnosis not present

## 2022-06-07 DIAGNOSIS — M25511 Pain in right shoulder: Secondary | ICD-10-CM | POA: Diagnosis not present

## 2022-06-09 DIAGNOSIS — M25611 Stiffness of right shoulder, not elsewhere classified: Secondary | ICD-10-CM | POA: Diagnosis not present

## 2022-06-09 DIAGNOSIS — M25411 Effusion, right shoulder: Secondary | ICD-10-CM | POA: Diagnosis not present

## 2022-06-09 DIAGNOSIS — R293 Abnormal posture: Secondary | ICD-10-CM | POA: Diagnosis not present

## 2022-06-09 DIAGNOSIS — M25511 Pain in right shoulder: Secondary | ICD-10-CM | POA: Diagnosis not present

## 2022-06-09 DIAGNOSIS — M6281 Muscle weakness (generalized): Secondary | ICD-10-CM | POA: Diagnosis not present

## 2022-06-16 DIAGNOSIS — M25411 Effusion, right shoulder: Secondary | ICD-10-CM | POA: Diagnosis not present

## 2022-06-16 DIAGNOSIS — M25611 Stiffness of right shoulder, not elsewhere classified: Secondary | ICD-10-CM | POA: Diagnosis not present

## 2022-06-16 DIAGNOSIS — M6281 Muscle weakness (generalized): Secondary | ICD-10-CM | POA: Diagnosis not present

## 2022-06-16 DIAGNOSIS — M25511 Pain in right shoulder: Secondary | ICD-10-CM | POA: Diagnosis not present

## 2022-06-16 DIAGNOSIS — R293 Abnormal posture: Secondary | ICD-10-CM | POA: Diagnosis not present

## 2022-06-19 ENCOUNTER — Other Ambulatory Visit: Payer: Self-pay | Admitting: *Deleted

## 2022-06-19 NOTE — Telephone Encounter (Signed)
Per note on 04/14/2021 "He was advised to follow up with PCP for headache management and refills of topiramate. "

## 2022-06-21 DIAGNOSIS — R293 Abnormal posture: Secondary | ICD-10-CM | POA: Diagnosis not present

## 2022-06-21 DIAGNOSIS — M25511 Pain in right shoulder: Secondary | ICD-10-CM | POA: Diagnosis not present

## 2022-06-21 DIAGNOSIS — M6281 Muscle weakness (generalized): Secondary | ICD-10-CM | POA: Diagnosis not present

## 2022-06-21 DIAGNOSIS — M25411 Effusion, right shoulder: Secondary | ICD-10-CM | POA: Diagnosis not present

## 2022-06-21 DIAGNOSIS — M25611 Stiffness of right shoulder, not elsewhere classified: Secondary | ICD-10-CM | POA: Diagnosis not present

## 2022-06-22 ENCOUNTER — Other Ambulatory Visit: Payer: Self-pay

## 2022-06-23 DIAGNOSIS — M25511 Pain in right shoulder: Secondary | ICD-10-CM | POA: Diagnosis not present

## 2022-06-23 DIAGNOSIS — M25411 Effusion, right shoulder: Secondary | ICD-10-CM | POA: Diagnosis not present

## 2022-06-23 DIAGNOSIS — M25611 Stiffness of right shoulder, not elsewhere classified: Secondary | ICD-10-CM | POA: Diagnosis not present

## 2022-06-23 DIAGNOSIS — R293 Abnormal posture: Secondary | ICD-10-CM | POA: Diagnosis not present

## 2022-06-23 DIAGNOSIS — M6281 Muscle weakness (generalized): Secondary | ICD-10-CM | POA: Diagnosis not present

## 2022-06-27 DIAGNOSIS — M6281 Muscle weakness (generalized): Secondary | ICD-10-CM | POA: Diagnosis not present

## 2022-06-27 DIAGNOSIS — M25611 Stiffness of right shoulder, not elsewhere classified: Secondary | ICD-10-CM | POA: Diagnosis not present

## 2022-06-27 DIAGNOSIS — M25411 Effusion, right shoulder: Secondary | ICD-10-CM | POA: Diagnosis not present

## 2022-06-27 DIAGNOSIS — R293 Abnormal posture: Secondary | ICD-10-CM | POA: Diagnosis not present

## 2022-06-27 DIAGNOSIS — M25511 Pain in right shoulder: Secondary | ICD-10-CM | POA: Diagnosis not present

## 2022-06-28 DIAGNOSIS — H43811 Vitreous degeneration, right eye: Secondary | ICD-10-CM | POA: Diagnosis not present

## 2022-06-28 DIAGNOSIS — H1712 Central corneal opacity, left eye: Secondary | ICD-10-CM | POA: Diagnosis not present

## 2022-06-28 DIAGNOSIS — H40023 Open angle with borderline findings, high risk, bilateral: Secondary | ICD-10-CM | POA: Diagnosis not present

## 2022-06-28 DIAGNOSIS — H3561 Retinal hemorrhage, right eye: Secondary | ICD-10-CM | POA: Diagnosis not present

## 2022-06-28 DIAGNOSIS — E119 Type 2 diabetes mellitus without complications: Secondary | ICD-10-CM | POA: Diagnosis not present

## 2022-06-28 DIAGNOSIS — H2513 Age-related nuclear cataract, bilateral: Secondary | ICD-10-CM | POA: Diagnosis not present

## 2022-06-28 DIAGNOSIS — H0102A Squamous blepharitis right eye, upper and lower eyelids: Secondary | ICD-10-CM | POA: Diagnosis not present

## 2022-06-28 DIAGNOSIS — H10413 Chronic giant papillary conjunctivitis, bilateral: Secondary | ICD-10-CM | POA: Diagnosis not present

## 2022-06-28 DIAGNOSIS — H0102B Squamous blepharitis left eye, upper and lower eyelids: Secondary | ICD-10-CM | POA: Diagnosis not present

## 2022-06-30 DIAGNOSIS — M6281 Muscle weakness (generalized): Secondary | ICD-10-CM | POA: Diagnosis not present

## 2022-06-30 DIAGNOSIS — M25511 Pain in right shoulder: Secondary | ICD-10-CM | POA: Diagnosis not present

## 2022-06-30 DIAGNOSIS — R293 Abnormal posture: Secondary | ICD-10-CM | POA: Diagnosis not present

## 2022-06-30 DIAGNOSIS — M25611 Stiffness of right shoulder, not elsewhere classified: Secondary | ICD-10-CM | POA: Diagnosis not present

## 2022-06-30 DIAGNOSIS — M25411 Effusion, right shoulder: Secondary | ICD-10-CM | POA: Diagnosis not present

## 2022-07-05 DIAGNOSIS — M25611 Stiffness of right shoulder, not elsewhere classified: Secondary | ICD-10-CM | POA: Diagnosis not present

## 2022-07-05 DIAGNOSIS — M25411 Effusion, right shoulder: Secondary | ICD-10-CM | POA: Diagnosis not present

## 2022-07-05 DIAGNOSIS — R293 Abnormal posture: Secondary | ICD-10-CM | POA: Diagnosis not present

## 2022-07-05 DIAGNOSIS — M6281 Muscle weakness (generalized): Secondary | ICD-10-CM | POA: Diagnosis not present

## 2022-07-05 DIAGNOSIS — M25511 Pain in right shoulder: Secondary | ICD-10-CM | POA: Diagnosis not present

## 2022-07-07 DIAGNOSIS — M25611 Stiffness of right shoulder, not elsewhere classified: Secondary | ICD-10-CM | POA: Diagnosis not present

## 2022-07-07 DIAGNOSIS — M25511 Pain in right shoulder: Secondary | ICD-10-CM | POA: Diagnosis not present

## 2022-07-07 DIAGNOSIS — M6281 Muscle weakness (generalized): Secondary | ICD-10-CM | POA: Diagnosis not present

## 2022-07-07 DIAGNOSIS — R293 Abnormal posture: Secondary | ICD-10-CM | POA: Diagnosis not present

## 2022-07-07 DIAGNOSIS — M25411 Effusion, right shoulder: Secondary | ICD-10-CM | POA: Diagnosis not present

## 2022-07-19 DIAGNOSIS — R293 Abnormal posture: Secondary | ICD-10-CM | POA: Diagnosis not present

## 2022-07-19 DIAGNOSIS — M6281 Muscle weakness (generalized): Secondary | ICD-10-CM | POA: Diagnosis not present

## 2022-07-19 DIAGNOSIS — M25411 Effusion, right shoulder: Secondary | ICD-10-CM | POA: Diagnosis not present

## 2022-07-19 DIAGNOSIS — M25611 Stiffness of right shoulder, not elsewhere classified: Secondary | ICD-10-CM | POA: Diagnosis not present

## 2022-07-19 DIAGNOSIS — M25511 Pain in right shoulder: Secondary | ICD-10-CM | POA: Diagnosis not present

## 2022-07-24 DIAGNOSIS — R293 Abnormal posture: Secondary | ICD-10-CM | POA: Diagnosis not present

## 2022-07-24 DIAGNOSIS — M25411 Effusion, right shoulder: Secondary | ICD-10-CM | POA: Diagnosis not present

## 2022-07-24 DIAGNOSIS — M6281 Muscle weakness (generalized): Secondary | ICD-10-CM | POA: Diagnosis not present

## 2022-07-24 DIAGNOSIS — M25611 Stiffness of right shoulder, not elsewhere classified: Secondary | ICD-10-CM | POA: Diagnosis not present

## 2022-07-24 DIAGNOSIS — M25511 Pain in right shoulder: Secondary | ICD-10-CM | POA: Diagnosis not present

## 2022-07-28 DIAGNOSIS — M6281 Muscle weakness (generalized): Secondary | ICD-10-CM | POA: Diagnosis not present

## 2022-07-28 DIAGNOSIS — M25511 Pain in right shoulder: Secondary | ICD-10-CM | POA: Diagnosis not present

## 2022-07-28 DIAGNOSIS — M25411 Effusion, right shoulder: Secondary | ICD-10-CM | POA: Diagnosis not present

## 2022-07-28 DIAGNOSIS — M25611 Stiffness of right shoulder, not elsewhere classified: Secondary | ICD-10-CM | POA: Diagnosis not present

## 2022-07-28 DIAGNOSIS — R293 Abnormal posture: Secondary | ICD-10-CM | POA: Diagnosis not present

## 2022-08-02 DIAGNOSIS — M25611 Stiffness of right shoulder, not elsewhere classified: Secondary | ICD-10-CM | POA: Diagnosis not present

## 2022-08-02 DIAGNOSIS — M25411 Effusion, right shoulder: Secondary | ICD-10-CM | POA: Diagnosis not present

## 2022-08-02 DIAGNOSIS — M25511 Pain in right shoulder: Secondary | ICD-10-CM | POA: Diagnosis not present

## 2022-08-02 DIAGNOSIS — R293 Abnormal posture: Secondary | ICD-10-CM | POA: Diagnosis not present

## 2022-08-02 DIAGNOSIS — M6281 Muscle weakness (generalized): Secondary | ICD-10-CM | POA: Diagnosis not present

## 2022-08-08 DIAGNOSIS — M25511 Pain in right shoulder: Secondary | ICD-10-CM | POA: Diagnosis not present

## 2022-08-08 DIAGNOSIS — M25411 Effusion, right shoulder: Secondary | ICD-10-CM | POA: Diagnosis not present

## 2022-08-08 DIAGNOSIS — R293 Abnormal posture: Secondary | ICD-10-CM | POA: Diagnosis not present

## 2022-08-08 DIAGNOSIS — M25611 Stiffness of right shoulder, not elsewhere classified: Secondary | ICD-10-CM | POA: Diagnosis not present

## 2022-08-08 DIAGNOSIS — M6281 Muscle weakness (generalized): Secondary | ICD-10-CM | POA: Diagnosis not present

## 2022-08-11 DIAGNOSIS — M25511 Pain in right shoulder: Secondary | ICD-10-CM | POA: Diagnosis not present

## 2022-08-11 DIAGNOSIS — M25411 Effusion, right shoulder: Secondary | ICD-10-CM | POA: Diagnosis not present

## 2022-08-11 DIAGNOSIS — R293 Abnormal posture: Secondary | ICD-10-CM | POA: Diagnosis not present

## 2022-08-11 DIAGNOSIS — M6281 Muscle weakness (generalized): Secondary | ICD-10-CM | POA: Diagnosis not present

## 2022-08-11 DIAGNOSIS — M25611 Stiffness of right shoulder, not elsewhere classified: Secondary | ICD-10-CM | POA: Diagnosis not present

## 2022-08-15 DIAGNOSIS — M25511 Pain in right shoulder: Secondary | ICD-10-CM | POA: Diagnosis not present

## 2022-08-16 ENCOUNTER — Other Ambulatory Visit: Payer: Self-pay | Admitting: Sports Medicine

## 2022-08-16 DIAGNOSIS — M25511 Pain in right shoulder: Secondary | ICD-10-CM

## 2022-09-16 ENCOUNTER — Ambulatory Visit
Admission: RE | Admit: 2022-09-16 | Discharge: 2022-09-16 | Disposition: A | Discharge status: Home / Self Care | Payer: Medicare Other | Source: Ambulatory Visit | Attending: Sports Medicine | Admitting: Sports Medicine

## 2022-09-16 DIAGNOSIS — M75111 Incomplete rotator cuff tear or rupture of right shoulder, not specified as traumatic: Secondary | ICD-10-CM | POA: Diagnosis not present

## 2022-09-16 DIAGNOSIS — M25511 Pain in right shoulder: Secondary | ICD-10-CM

## 2022-09-16 DIAGNOSIS — M67813 Other specified disorders of tendon, right shoulder: Secondary | ICD-10-CM | POA: Diagnosis not present

## 2022-09-29 DIAGNOSIS — M25511 Pain in right shoulder: Secondary | ICD-10-CM | POA: Diagnosis not present

## 2022-10-05 DIAGNOSIS — E7800 Pure hypercholesterolemia, unspecified: Secondary | ICD-10-CM | POA: Diagnosis not present

## 2022-10-05 DIAGNOSIS — Z01818 Encounter for other preprocedural examination: Secondary | ICD-10-CM | POA: Diagnosis not present

## 2022-10-05 DIAGNOSIS — I10 Essential (primary) hypertension: Secondary | ICD-10-CM | POA: Diagnosis not present

## 2022-10-05 DIAGNOSIS — E1169 Type 2 diabetes mellitus with other specified complication: Secondary | ICD-10-CM | POA: Diagnosis not present

## 2022-10-05 DIAGNOSIS — D649 Anemia, unspecified: Secondary | ICD-10-CM | POA: Diagnosis not present

## 2022-10-23 DIAGNOSIS — M7541 Impingement syndrome of right shoulder: Secondary | ICD-10-CM | POA: Diagnosis not present

## 2022-10-23 DIAGNOSIS — M7521 Bicipital tendinitis, right shoulder: Secondary | ICD-10-CM | POA: Diagnosis not present

## 2022-10-23 DIAGNOSIS — M25811 Other specified joint disorders, right shoulder: Secondary | ICD-10-CM | POA: Diagnosis not present

## 2022-10-23 DIAGNOSIS — G8918 Other acute postprocedural pain: Secondary | ICD-10-CM | POA: Diagnosis not present

## 2022-10-23 DIAGNOSIS — S46211A Strain of muscle, fascia and tendon of other parts of biceps, right arm, initial encounter: Secondary | ICD-10-CM | POA: Diagnosis not present

## 2022-10-23 DIAGNOSIS — M24111 Other articular cartilage disorders, right shoulder: Secondary | ICD-10-CM | POA: Diagnosis not present

## 2022-10-23 DIAGNOSIS — M19011 Primary osteoarthritis, right shoulder: Secondary | ICD-10-CM | POA: Diagnosis not present

## 2022-10-23 DIAGNOSIS — M75121 Complete rotator cuff tear or rupture of right shoulder, not specified as traumatic: Secondary | ICD-10-CM | POA: Diagnosis not present

## 2022-10-23 DIAGNOSIS — S43431A Superior glenoid labrum lesion of right shoulder, initial encounter: Secondary | ICD-10-CM | POA: Diagnosis not present

## 2022-10-23 DIAGNOSIS — S46011A Strain of muscle(s) and tendon(s) of the rotator cuff of right shoulder, initial encounter: Secondary | ICD-10-CM | POA: Diagnosis not present

## 2022-10-31 DIAGNOSIS — M19011 Primary osteoarthritis, right shoulder: Secondary | ICD-10-CM | POA: Diagnosis not present

## 2022-11-01 DIAGNOSIS — M6281 Muscle weakness (generalized): Secondary | ICD-10-CM | POA: Diagnosis not present

## 2022-11-01 DIAGNOSIS — Z4789 Encounter for other orthopedic aftercare: Secondary | ICD-10-CM | POA: Diagnosis not present

## 2022-11-01 DIAGNOSIS — M25511 Pain in right shoulder: Secondary | ICD-10-CM | POA: Diagnosis not present

## 2022-11-01 DIAGNOSIS — M25611 Stiffness of right shoulder, not elsewhere classified: Secondary | ICD-10-CM | POA: Diagnosis not present

## 2022-11-01 DIAGNOSIS — M25411 Effusion, right shoulder: Secondary | ICD-10-CM | POA: Diagnosis not present

## 2022-11-07 DIAGNOSIS — M25511 Pain in right shoulder: Secondary | ICD-10-CM | POA: Diagnosis not present

## 2022-11-07 DIAGNOSIS — M25411 Effusion, right shoulder: Secondary | ICD-10-CM | POA: Diagnosis not present

## 2022-11-07 DIAGNOSIS — M25611 Stiffness of right shoulder, not elsewhere classified: Secondary | ICD-10-CM | POA: Diagnosis not present

## 2022-11-07 DIAGNOSIS — Z4789 Encounter for other orthopedic aftercare: Secondary | ICD-10-CM | POA: Diagnosis not present

## 2022-11-07 DIAGNOSIS — M6281 Muscle weakness (generalized): Secondary | ICD-10-CM | POA: Diagnosis not present

## 2022-11-09 DIAGNOSIS — M25611 Stiffness of right shoulder, not elsewhere classified: Secondary | ICD-10-CM | POA: Diagnosis not present

## 2022-11-09 DIAGNOSIS — M25511 Pain in right shoulder: Secondary | ICD-10-CM | POA: Diagnosis not present

## 2022-11-09 DIAGNOSIS — M25411 Effusion, right shoulder: Secondary | ICD-10-CM | POA: Diagnosis not present

## 2022-11-09 DIAGNOSIS — M6281 Muscle weakness (generalized): Secondary | ICD-10-CM | POA: Diagnosis not present

## 2022-11-09 DIAGNOSIS — Z4789 Encounter for other orthopedic aftercare: Secondary | ICD-10-CM | POA: Diagnosis not present

## 2022-11-14 DIAGNOSIS — M25611 Stiffness of right shoulder, not elsewhere classified: Secondary | ICD-10-CM | POA: Diagnosis not present

## 2022-11-14 DIAGNOSIS — M6281 Muscle weakness (generalized): Secondary | ICD-10-CM | POA: Diagnosis not present

## 2022-11-14 DIAGNOSIS — Z4789 Encounter for other orthopedic aftercare: Secondary | ICD-10-CM | POA: Diagnosis not present

## 2022-11-14 DIAGNOSIS — M25511 Pain in right shoulder: Secondary | ICD-10-CM | POA: Diagnosis not present

## 2022-11-14 DIAGNOSIS — M25411 Effusion, right shoulder: Secondary | ICD-10-CM | POA: Diagnosis not present

## 2022-11-16 DIAGNOSIS — M6281 Muscle weakness (generalized): Secondary | ICD-10-CM | POA: Diagnosis not present

## 2022-11-16 DIAGNOSIS — M25611 Stiffness of right shoulder, not elsewhere classified: Secondary | ICD-10-CM | POA: Diagnosis not present

## 2022-11-16 DIAGNOSIS — Z4789 Encounter for other orthopedic aftercare: Secondary | ICD-10-CM | POA: Diagnosis not present

## 2022-11-16 DIAGNOSIS — M25411 Effusion, right shoulder: Secondary | ICD-10-CM | POA: Diagnosis not present

## 2022-11-16 DIAGNOSIS — M25511 Pain in right shoulder: Secondary | ICD-10-CM | POA: Diagnosis not present

## 2022-11-22 DIAGNOSIS — M6281 Muscle weakness (generalized): Secondary | ICD-10-CM | POA: Diagnosis not present

## 2022-11-22 DIAGNOSIS — M25611 Stiffness of right shoulder, not elsewhere classified: Secondary | ICD-10-CM | POA: Diagnosis not present

## 2022-11-22 DIAGNOSIS — M25411 Effusion, right shoulder: Secondary | ICD-10-CM | POA: Diagnosis not present

## 2022-11-22 DIAGNOSIS — M25511 Pain in right shoulder: Secondary | ICD-10-CM | POA: Diagnosis not present

## 2022-11-22 DIAGNOSIS — Z4789 Encounter for other orthopedic aftercare: Secondary | ICD-10-CM | POA: Diagnosis not present

## 2022-11-23 DIAGNOSIS — Z4789 Encounter for other orthopedic aftercare: Secondary | ICD-10-CM | POA: Diagnosis not present

## 2022-11-23 DIAGNOSIS — M25611 Stiffness of right shoulder, not elsewhere classified: Secondary | ICD-10-CM | POA: Diagnosis not present

## 2022-11-23 DIAGNOSIS — M6281 Muscle weakness (generalized): Secondary | ICD-10-CM | POA: Diagnosis not present

## 2022-11-23 DIAGNOSIS — M25511 Pain in right shoulder: Secondary | ICD-10-CM | POA: Diagnosis not present

## 2022-11-23 DIAGNOSIS — M25411 Effusion, right shoulder: Secondary | ICD-10-CM | POA: Diagnosis not present

## 2022-11-28 DIAGNOSIS — M6281 Muscle weakness (generalized): Secondary | ICD-10-CM | POA: Diagnosis not present

## 2022-11-28 DIAGNOSIS — M25511 Pain in right shoulder: Secondary | ICD-10-CM | POA: Diagnosis not present

## 2022-11-28 DIAGNOSIS — M25411 Effusion, right shoulder: Secondary | ICD-10-CM | POA: Diagnosis not present

## 2022-11-28 DIAGNOSIS — Z4789 Encounter for other orthopedic aftercare: Secondary | ICD-10-CM | POA: Diagnosis not present

## 2022-11-28 DIAGNOSIS — M25611 Stiffness of right shoulder, not elsewhere classified: Secondary | ICD-10-CM | POA: Diagnosis not present

## 2022-11-30 DIAGNOSIS — M6281 Muscle weakness (generalized): Secondary | ICD-10-CM | POA: Diagnosis not present

## 2022-11-30 DIAGNOSIS — M25411 Effusion, right shoulder: Secondary | ICD-10-CM | POA: Diagnosis not present

## 2022-11-30 DIAGNOSIS — Z4789 Encounter for other orthopedic aftercare: Secondary | ICD-10-CM | POA: Diagnosis not present

## 2022-11-30 DIAGNOSIS — M25611 Stiffness of right shoulder, not elsewhere classified: Secondary | ICD-10-CM | POA: Diagnosis not present

## 2022-11-30 DIAGNOSIS — M25511 Pain in right shoulder: Secondary | ICD-10-CM | POA: Diagnosis not present

## 2022-12-05 DIAGNOSIS — M25611 Stiffness of right shoulder, not elsewhere classified: Secondary | ICD-10-CM | POA: Diagnosis not present

## 2022-12-05 DIAGNOSIS — M25511 Pain in right shoulder: Secondary | ICD-10-CM | POA: Diagnosis not present

## 2022-12-05 DIAGNOSIS — Z4789 Encounter for other orthopedic aftercare: Secondary | ICD-10-CM | POA: Diagnosis not present

## 2022-12-05 DIAGNOSIS — M6281 Muscle weakness (generalized): Secondary | ICD-10-CM | POA: Diagnosis not present

## 2022-12-05 DIAGNOSIS — M25411 Effusion, right shoulder: Secondary | ICD-10-CM | POA: Diagnosis not present

## 2022-12-07 DIAGNOSIS — M19011 Primary osteoarthritis, right shoulder: Secondary | ICD-10-CM | POA: Diagnosis not present

## 2022-12-08 DIAGNOSIS — M25611 Stiffness of right shoulder, not elsewhere classified: Secondary | ICD-10-CM | POA: Diagnosis not present

## 2022-12-08 DIAGNOSIS — M6281 Muscle weakness (generalized): Secondary | ICD-10-CM | POA: Diagnosis not present

## 2022-12-08 DIAGNOSIS — Z4789 Encounter for other orthopedic aftercare: Secondary | ICD-10-CM | POA: Diagnosis not present

## 2022-12-08 DIAGNOSIS — M25511 Pain in right shoulder: Secondary | ICD-10-CM | POA: Diagnosis not present

## 2022-12-08 DIAGNOSIS — M25411 Effusion, right shoulder: Secondary | ICD-10-CM | POA: Diagnosis not present

## 2022-12-11 DIAGNOSIS — M6281 Muscle weakness (generalized): Secondary | ICD-10-CM | POA: Diagnosis not present

## 2022-12-11 DIAGNOSIS — M25411 Effusion, right shoulder: Secondary | ICD-10-CM | POA: Diagnosis not present

## 2022-12-11 DIAGNOSIS — Z4789 Encounter for other orthopedic aftercare: Secondary | ICD-10-CM | POA: Diagnosis not present

## 2022-12-11 DIAGNOSIS — M25511 Pain in right shoulder: Secondary | ICD-10-CM | POA: Diagnosis not present

## 2022-12-11 DIAGNOSIS — M25611 Stiffness of right shoulder, not elsewhere classified: Secondary | ICD-10-CM | POA: Diagnosis not present

## 2022-12-13 DIAGNOSIS — M25511 Pain in right shoulder: Secondary | ICD-10-CM | POA: Diagnosis not present

## 2022-12-13 DIAGNOSIS — M25411 Effusion, right shoulder: Secondary | ICD-10-CM | POA: Diagnosis not present

## 2022-12-13 DIAGNOSIS — M25611 Stiffness of right shoulder, not elsewhere classified: Secondary | ICD-10-CM | POA: Diagnosis not present

## 2022-12-13 DIAGNOSIS — M6281 Muscle weakness (generalized): Secondary | ICD-10-CM | POA: Diagnosis not present

## 2022-12-13 DIAGNOSIS — Z4789 Encounter for other orthopedic aftercare: Secondary | ICD-10-CM | POA: Diagnosis not present

## 2022-12-19 DIAGNOSIS — M25411 Effusion, right shoulder: Secondary | ICD-10-CM | POA: Diagnosis not present

## 2022-12-19 DIAGNOSIS — M25511 Pain in right shoulder: Secondary | ICD-10-CM | POA: Diagnosis not present

## 2022-12-19 DIAGNOSIS — M25611 Stiffness of right shoulder, not elsewhere classified: Secondary | ICD-10-CM | POA: Diagnosis not present

## 2022-12-19 DIAGNOSIS — Z4789 Encounter for other orthopedic aftercare: Secondary | ICD-10-CM | POA: Diagnosis not present

## 2022-12-19 DIAGNOSIS — M6281 Muscle weakness (generalized): Secondary | ICD-10-CM | POA: Diagnosis not present

## 2022-12-21 DIAGNOSIS — M25611 Stiffness of right shoulder, not elsewhere classified: Secondary | ICD-10-CM | POA: Diagnosis not present

## 2022-12-21 DIAGNOSIS — M6281 Muscle weakness (generalized): Secondary | ICD-10-CM | POA: Diagnosis not present

## 2022-12-21 DIAGNOSIS — M25411 Effusion, right shoulder: Secondary | ICD-10-CM | POA: Diagnosis not present

## 2022-12-21 DIAGNOSIS — M25511 Pain in right shoulder: Secondary | ICD-10-CM | POA: Diagnosis not present

## 2022-12-21 DIAGNOSIS — Z4789 Encounter for other orthopedic aftercare: Secondary | ICD-10-CM | POA: Diagnosis not present

## 2022-12-25 DIAGNOSIS — H40023 Open angle with borderline findings, high risk, bilateral: Secondary | ICD-10-CM | POA: Diagnosis not present

## 2022-12-25 DIAGNOSIS — H3561 Retinal hemorrhage, right eye: Secondary | ICD-10-CM | POA: Diagnosis not present

## 2022-12-25 DIAGNOSIS — H43811 Vitreous degeneration, right eye: Secondary | ICD-10-CM | POA: Diagnosis not present

## 2022-12-26 DIAGNOSIS — M25611 Stiffness of right shoulder, not elsewhere classified: Secondary | ICD-10-CM | POA: Diagnosis not present

## 2022-12-26 DIAGNOSIS — M25511 Pain in right shoulder: Secondary | ICD-10-CM | POA: Diagnosis not present

## 2022-12-26 DIAGNOSIS — M25411 Effusion, right shoulder: Secondary | ICD-10-CM | POA: Diagnosis not present

## 2022-12-26 DIAGNOSIS — Z4789 Encounter for other orthopedic aftercare: Secondary | ICD-10-CM | POA: Diagnosis not present

## 2022-12-26 DIAGNOSIS — M6281 Muscle weakness (generalized): Secondary | ICD-10-CM | POA: Diagnosis not present

## 2022-12-29 DIAGNOSIS — Z4789 Encounter for other orthopedic aftercare: Secondary | ICD-10-CM | POA: Diagnosis not present

## 2022-12-29 DIAGNOSIS — M25411 Effusion, right shoulder: Secondary | ICD-10-CM | POA: Diagnosis not present

## 2022-12-29 DIAGNOSIS — M25511 Pain in right shoulder: Secondary | ICD-10-CM | POA: Diagnosis not present

## 2022-12-29 DIAGNOSIS — M6281 Muscle weakness (generalized): Secondary | ICD-10-CM | POA: Diagnosis not present

## 2022-12-29 DIAGNOSIS — M25611 Stiffness of right shoulder, not elsewhere classified: Secondary | ICD-10-CM | POA: Diagnosis not present

## 2023-01-05 DIAGNOSIS — Z4789 Encounter for other orthopedic aftercare: Secondary | ICD-10-CM | POA: Diagnosis not present

## 2023-01-05 DIAGNOSIS — M25611 Stiffness of right shoulder, not elsewhere classified: Secondary | ICD-10-CM | POA: Diagnosis not present

## 2023-01-05 DIAGNOSIS — M25511 Pain in right shoulder: Secondary | ICD-10-CM | POA: Diagnosis not present

## 2023-01-05 DIAGNOSIS — M25411 Effusion, right shoulder: Secondary | ICD-10-CM | POA: Diagnosis not present

## 2023-01-05 DIAGNOSIS — M6281 Muscle weakness (generalized): Secondary | ICD-10-CM | POA: Diagnosis not present

## 2023-01-10 DIAGNOSIS — M25611 Stiffness of right shoulder, not elsewhere classified: Secondary | ICD-10-CM | POA: Diagnosis not present

## 2023-01-10 DIAGNOSIS — M6281 Muscle weakness (generalized): Secondary | ICD-10-CM | POA: Diagnosis not present

## 2023-01-10 DIAGNOSIS — Z4789 Encounter for other orthopedic aftercare: Secondary | ICD-10-CM | POA: Diagnosis not present

## 2023-01-10 DIAGNOSIS — M25411 Effusion, right shoulder: Secondary | ICD-10-CM | POA: Diagnosis not present

## 2023-01-10 DIAGNOSIS — M25511 Pain in right shoulder: Secondary | ICD-10-CM | POA: Diagnosis not present

## 2023-01-17 DIAGNOSIS — Z4789 Encounter for other orthopedic aftercare: Secondary | ICD-10-CM | POA: Diagnosis not present

## 2023-01-17 DIAGNOSIS — M6281 Muscle weakness (generalized): Secondary | ICD-10-CM | POA: Diagnosis not present

## 2023-01-17 DIAGNOSIS — M25511 Pain in right shoulder: Secondary | ICD-10-CM | POA: Diagnosis not present

## 2023-01-17 DIAGNOSIS — M25611 Stiffness of right shoulder, not elsewhere classified: Secondary | ICD-10-CM | POA: Diagnosis not present

## 2023-01-17 DIAGNOSIS — M25411 Effusion, right shoulder: Secondary | ICD-10-CM | POA: Diagnosis not present

## 2023-01-18 DIAGNOSIS — M19011 Primary osteoarthritis, right shoulder: Secondary | ICD-10-CM | POA: Diagnosis not present

## 2023-01-19 DIAGNOSIS — M25411 Effusion, right shoulder: Secondary | ICD-10-CM | POA: Diagnosis not present

## 2023-01-19 DIAGNOSIS — Z4789 Encounter for other orthopedic aftercare: Secondary | ICD-10-CM | POA: Diagnosis not present

## 2023-01-19 DIAGNOSIS — M6281 Muscle weakness (generalized): Secondary | ICD-10-CM | POA: Diagnosis not present

## 2023-01-19 DIAGNOSIS — M25511 Pain in right shoulder: Secondary | ICD-10-CM | POA: Diagnosis not present

## 2023-01-19 DIAGNOSIS — M25611 Stiffness of right shoulder, not elsewhere classified: Secondary | ICD-10-CM | POA: Diagnosis not present

## 2023-01-23 DIAGNOSIS — M6281 Muscle weakness (generalized): Secondary | ICD-10-CM | POA: Diagnosis not present

## 2023-01-23 DIAGNOSIS — M25511 Pain in right shoulder: Secondary | ICD-10-CM | POA: Diagnosis not present

## 2023-01-23 DIAGNOSIS — M25611 Stiffness of right shoulder, not elsewhere classified: Secondary | ICD-10-CM | POA: Diagnosis not present

## 2023-01-23 DIAGNOSIS — Z4789 Encounter for other orthopedic aftercare: Secondary | ICD-10-CM | POA: Diagnosis not present

## 2023-01-23 DIAGNOSIS — M25411 Effusion, right shoulder: Secondary | ICD-10-CM | POA: Diagnosis not present

## 2023-01-25 DIAGNOSIS — M25511 Pain in right shoulder: Secondary | ICD-10-CM | POA: Diagnosis not present

## 2023-01-25 DIAGNOSIS — M25611 Stiffness of right shoulder, not elsewhere classified: Secondary | ICD-10-CM | POA: Diagnosis not present

## 2023-01-25 DIAGNOSIS — M25411 Effusion, right shoulder: Secondary | ICD-10-CM | POA: Diagnosis not present

## 2023-01-25 DIAGNOSIS — Z4789 Encounter for other orthopedic aftercare: Secondary | ICD-10-CM | POA: Diagnosis not present

## 2023-01-25 DIAGNOSIS — M6281 Muscle weakness (generalized): Secondary | ICD-10-CM | POA: Diagnosis not present

## 2023-01-29 DIAGNOSIS — Z79899 Other long term (current) drug therapy: Secondary | ICD-10-CM | POA: Diagnosis not present

## 2023-01-29 DIAGNOSIS — Z23 Encounter for immunization: Secondary | ICD-10-CM | POA: Diagnosis not present

## 2023-01-29 DIAGNOSIS — E119 Type 2 diabetes mellitus without complications: Secondary | ICD-10-CM | POA: Diagnosis not present

## 2023-01-29 DIAGNOSIS — E7800 Pure hypercholesterolemia, unspecified: Secondary | ICD-10-CM | POA: Diagnosis not present

## 2023-01-29 DIAGNOSIS — I10 Essential (primary) hypertension: Secondary | ICD-10-CM | POA: Diagnosis not present

## 2023-01-29 DIAGNOSIS — Z0001 Encounter for general adult medical examination with abnormal findings: Secondary | ICD-10-CM | POA: Diagnosis not present

## 2023-01-29 DIAGNOSIS — Z1331 Encounter for screening for depression: Secondary | ICD-10-CM | POA: Diagnosis not present

## 2023-01-29 DIAGNOSIS — Z125 Encounter for screening for malignant neoplasm of prostate: Secondary | ICD-10-CM | POA: Diagnosis not present

## 2023-01-29 DIAGNOSIS — G8929 Other chronic pain: Secondary | ICD-10-CM | POA: Diagnosis not present

## 2023-01-30 DIAGNOSIS — M25611 Stiffness of right shoulder, not elsewhere classified: Secondary | ICD-10-CM | POA: Diagnosis not present

## 2023-01-30 DIAGNOSIS — M25411 Effusion, right shoulder: Secondary | ICD-10-CM | POA: Diagnosis not present

## 2023-01-30 DIAGNOSIS — M6281 Muscle weakness (generalized): Secondary | ICD-10-CM | POA: Diagnosis not present

## 2023-01-30 DIAGNOSIS — Z4789 Encounter for other orthopedic aftercare: Secondary | ICD-10-CM | POA: Diagnosis not present

## 2023-01-30 DIAGNOSIS — M25511 Pain in right shoulder: Secondary | ICD-10-CM | POA: Diagnosis not present

## 2023-02-01 DIAGNOSIS — Z4789 Encounter for other orthopedic aftercare: Secondary | ICD-10-CM | POA: Diagnosis not present

## 2023-02-01 DIAGNOSIS — M25511 Pain in right shoulder: Secondary | ICD-10-CM | POA: Diagnosis not present

## 2023-02-01 DIAGNOSIS — M25411 Effusion, right shoulder: Secondary | ICD-10-CM | POA: Diagnosis not present

## 2023-02-01 DIAGNOSIS — M6281 Muscle weakness (generalized): Secondary | ICD-10-CM | POA: Diagnosis not present

## 2023-02-01 DIAGNOSIS — M25611 Stiffness of right shoulder, not elsewhere classified: Secondary | ICD-10-CM | POA: Diagnosis not present

## 2023-02-07 DIAGNOSIS — M25411 Effusion, right shoulder: Secondary | ICD-10-CM | POA: Diagnosis not present

## 2023-02-07 DIAGNOSIS — M6281 Muscle weakness (generalized): Secondary | ICD-10-CM | POA: Diagnosis not present

## 2023-02-07 DIAGNOSIS — M25511 Pain in right shoulder: Secondary | ICD-10-CM | POA: Diagnosis not present

## 2023-02-07 DIAGNOSIS — M25611 Stiffness of right shoulder, not elsewhere classified: Secondary | ICD-10-CM | POA: Diagnosis not present

## 2023-02-07 DIAGNOSIS — Z4789 Encounter for other orthopedic aftercare: Secondary | ICD-10-CM | POA: Diagnosis not present

## 2023-02-14 DIAGNOSIS — M25411 Effusion, right shoulder: Secondary | ICD-10-CM | POA: Diagnosis not present

## 2023-02-14 DIAGNOSIS — M25511 Pain in right shoulder: Secondary | ICD-10-CM | POA: Diagnosis not present

## 2023-02-14 DIAGNOSIS — M25611 Stiffness of right shoulder, not elsewhere classified: Secondary | ICD-10-CM | POA: Diagnosis not present

## 2023-02-14 DIAGNOSIS — Z4789 Encounter for other orthopedic aftercare: Secondary | ICD-10-CM | POA: Diagnosis not present

## 2023-02-14 DIAGNOSIS — M6281 Muscle weakness (generalized): Secondary | ICD-10-CM | POA: Diagnosis not present

## 2023-02-21 DIAGNOSIS — M25511 Pain in right shoulder: Secondary | ICD-10-CM | POA: Diagnosis not present

## 2023-02-21 DIAGNOSIS — Z4789 Encounter for other orthopedic aftercare: Secondary | ICD-10-CM | POA: Diagnosis not present

## 2023-02-21 DIAGNOSIS — M25411 Effusion, right shoulder: Secondary | ICD-10-CM | POA: Diagnosis not present

## 2023-02-21 DIAGNOSIS — M6281 Muscle weakness (generalized): Secondary | ICD-10-CM | POA: Diagnosis not present

## 2023-02-21 DIAGNOSIS — M25611 Stiffness of right shoulder, not elsewhere classified: Secondary | ICD-10-CM | POA: Diagnosis not present

## 2023-03-19 DIAGNOSIS — E119 Type 2 diabetes mellitus without complications: Secondary | ICD-10-CM | POA: Diagnosis not present

## 2023-06-26 DIAGNOSIS — H40023 Open angle with borderline findings, high risk, bilateral: Secondary | ICD-10-CM | POA: Diagnosis not present

## 2023-06-26 DIAGNOSIS — H43811 Vitreous degeneration, right eye: Secondary | ICD-10-CM | POA: Diagnosis not present

## 2023-06-26 DIAGNOSIS — H3561 Retinal hemorrhage, right eye: Secondary | ICD-10-CM | POA: Diagnosis not present

## 2023-07-30 DIAGNOSIS — Z79899 Other long term (current) drug therapy: Secondary | ICD-10-CM | POA: Diagnosis not present

## 2023-07-30 DIAGNOSIS — E119 Type 2 diabetes mellitus without complications: Secondary | ICD-10-CM | POA: Diagnosis not present

## 2023-07-30 DIAGNOSIS — I10 Essential (primary) hypertension: Secondary | ICD-10-CM | POA: Diagnosis not present

## 2023-07-30 DIAGNOSIS — E7800 Pure hypercholesterolemia, unspecified: Secondary | ICD-10-CM | POA: Diagnosis not present

## 2024-01-14 DIAGNOSIS — Z0000 Encounter for general adult medical examination without abnormal findings: Secondary | ICD-10-CM | POA: Diagnosis not present

## 2024-01-14 DIAGNOSIS — Z23 Encounter for immunization: Secondary | ICD-10-CM | POA: Diagnosis not present

## 2024-01-14 DIAGNOSIS — E119 Type 2 diabetes mellitus without complications: Secondary | ICD-10-CM | POA: Diagnosis not present

## 2024-01-14 DIAGNOSIS — E7800 Pure hypercholesterolemia, unspecified: Secondary | ICD-10-CM | POA: Diagnosis not present

## 2024-01-14 DIAGNOSIS — Z79899 Other long term (current) drug therapy: Secondary | ICD-10-CM | POA: Diagnosis not present

## 2024-01-14 DIAGNOSIS — Z125 Encounter for screening for malignant neoplasm of prostate: Secondary | ICD-10-CM | POA: Diagnosis not present
